# Patient Record
Sex: Male | Born: 1955 | Race: White | Hispanic: No | Marital: Married | Smoking: Never smoker
Health system: Southern US, Community
[De-identification: ages and names within clinical notes are randomized; demographics above are authoritative.]

## PROBLEM LIST (undated history)

## (undated) DIAGNOSIS — I10 Essential (primary) hypertension: Secondary | ICD-10-CM

## (undated) DIAGNOSIS — G629 Polyneuropathy, unspecified: Secondary | ICD-10-CM

## (undated) DIAGNOSIS — C9000 Multiple myeloma not having achieved remission: Secondary | ICD-10-CM

## (undated) DIAGNOSIS — Z9221 Personal history of antineoplastic chemotherapy: Secondary | ICD-10-CM

## (undated) DIAGNOSIS — E7800 Pure hypercholesterolemia, unspecified: Secondary | ICD-10-CM

## (undated) DIAGNOSIS — C801 Malignant (primary) neoplasm, unspecified: Secondary | ICD-10-CM

## (undated) DIAGNOSIS — T7840XA Allergy, unspecified, initial encounter: Secondary | ICD-10-CM

## (undated) DIAGNOSIS — J069 Acute upper respiratory infection, unspecified: Secondary | ICD-10-CM

## (undated) DIAGNOSIS — D649 Anemia, unspecified: Secondary | ICD-10-CM

## (undated) DIAGNOSIS — D126 Benign neoplasm of colon, unspecified: Secondary | ICD-10-CM

## (undated) DIAGNOSIS — R011 Cardiac murmur, unspecified: Secondary | ICD-10-CM

## (undated) DIAGNOSIS — J189 Pneumonia, unspecified organism: Secondary | ICD-10-CM

## (undated) DIAGNOSIS — Z9889 Other specified postprocedural states: Secondary | ICD-10-CM

## (undated) DIAGNOSIS — T783XXA Angioneurotic edema, initial encounter: Secondary | ICD-10-CM

## (undated) DIAGNOSIS — E663 Overweight: Secondary | ICD-10-CM

## (undated) DIAGNOSIS — Z9484 Stem cells transplant status: Secondary | ICD-10-CM

## (undated) DIAGNOSIS — I2510 Atherosclerotic heart disease of native coronary artery without angina pectoris: Secondary | ICD-10-CM

## (undated) DIAGNOSIS — L509 Urticaria, unspecified: Secondary | ICD-10-CM

## (undated) HISTORY — DX: Multiple myeloma not having achieved remission: C90.00

## (undated) HISTORY — DX: Cardiac murmur, unspecified: R01.1

## (undated) HISTORY — DX: Essential (primary) hypertension: I10

## (undated) HISTORY — DX: Urticaria, unspecified: L50.9

## (undated) HISTORY — DX: Angioneurotic edema, initial encounter: T78.3XXA

## (undated) HISTORY — DX: Acute upper respiratory infection, unspecified: J06.9

## (undated) HISTORY — DX: Pure hypercholesterolemia, unspecified: E78.00

## (undated) HISTORY — DX: Atherosclerotic heart disease of native coronary artery without angina pectoris: I25.10

## (undated) HISTORY — DX: Pneumonia, unspecified organism: J18.9

## (undated) HISTORY — DX: Allergy, unspecified, initial encounter: T78.40XA

## (undated) HISTORY — DX: Benign neoplasm of colon, unspecified: D12.6

## (undated) HISTORY — DX: Anemia, unspecified: D64.9

## (undated) HISTORY — DX: Personal history of antineoplastic chemotherapy: Z92.21

## (undated) HISTORY — DX: Malignant (primary) neoplasm, unspecified: C80.1

## (undated) HISTORY — PX: LIMBAL STEM CELL TRANSPLANT: SHX1969

## (undated) HISTORY — DX: Other specified postprocedural states: Z98.890

## (undated) HISTORY — DX: Overweight: E66.3

## (undated) HISTORY — DX: Polyneuropathy, unspecified: G62.9

---

## 1998-11-20 ENCOUNTER — Ambulatory Visit (HOSPITAL_COMMUNITY): Admission: RE | Admit: 1998-11-20 | Discharge: 1998-11-20 | Payer: Self-pay | Admitting: Internal Medicine

## 1998-11-20 ENCOUNTER — Encounter: Payer: Self-pay | Admitting: Internal Medicine

## 2002-07-05 ENCOUNTER — Encounter: Admission: RE | Admit: 2002-07-05 | Discharge: 2002-07-05 | Payer: Self-pay | Admitting: Internal Medicine

## 2002-07-05 ENCOUNTER — Encounter: Payer: Self-pay | Admitting: Internal Medicine

## 2002-07-12 ENCOUNTER — Inpatient Hospital Stay (HOSPITAL_COMMUNITY): Admission: EM | Admit: 2002-07-12 | Discharge: 2002-07-13 | Payer: Self-pay | Admitting: Emergency Medicine

## 2002-07-12 ENCOUNTER — Encounter: Payer: Self-pay | Admitting: Emergency Medicine

## 2002-07-13 DIAGNOSIS — Z9889 Other specified postprocedural states: Secondary | ICD-10-CM

## 2002-07-13 HISTORY — DX: Other specified postprocedural states: Z98.890

## 2002-07-15 DIAGNOSIS — C9000 Multiple myeloma not having achieved remission: Secondary | ICD-10-CM

## 2002-07-15 HISTORY — DX: Multiple myeloma not having achieved remission: C90.00

## 2002-11-04 ENCOUNTER — Encounter: Payer: Self-pay | Admitting: *Deleted

## 2002-11-04 ENCOUNTER — Ambulatory Visit (HOSPITAL_COMMUNITY): Admission: RE | Admit: 2002-11-04 | Discharge: 2002-11-04 | Payer: Self-pay | Admitting: *Deleted

## 2002-11-15 ENCOUNTER — Other Ambulatory Visit: Admission: RE | Admit: 2002-11-15 | Discharge: 2002-11-15 | Payer: Self-pay | Admitting: *Deleted

## 2002-11-15 ENCOUNTER — Encounter (INDEPENDENT_AMBULATORY_CARE_PROVIDER_SITE_OTHER): Payer: Self-pay | Admitting: Specialist

## 2003-02-07 ENCOUNTER — Ambulatory Visit (HOSPITAL_COMMUNITY): Admission: RE | Admit: 2003-02-07 | Discharge: 2003-02-07 | Payer: Self-pay | Admitting: Oncology

## 2003-02-07 ENCOUNTER — Encounter: Payer: Self-pay | Admitting: Oncology

## 2003-02-09 ENCOUNTER — Encounter: Payer: Self-pay | Admitting: *Deleted

## 2003-02-09 ENCOUNTER — Ambulatory Visit (HOSPITAL_COMMUNITY): Admission: RE | Admit: 2003-02-09 | Discharge: 2003-02-09 | Payer: Self-pay | Admitting: *Deleted

## 2003-02-10 ENCOUNTER — Inpatient Hospital Stay (HOSPITAL_COMMUNITY): Admission: AD | Admit: 2003-02-10 | Discharge: 2003-02-16 | Payer: Self-pay | Admitting: Oncology

## 2003-02-11 ENCOUNTER — Encounter (INDEPENDENT_AMBULATORY_CARE_PROVIDER_SITE_OTHER): Payer: Self-pay | Admitting: Specialist

## 2003-02-11 ENCOUNTER — Encounter: Payer: Self-pay | Admitting: Oncology

## 2003-02-14 ENCOUNTER — Encounter (INDEPENDENT_AMBULATORY_CARE_PROVIDER_SITE_OTHER): Payer: Self-pay | Admitting: Specialist

## 2003-05-16 DIAGNOSIS — Z9221 Personal history of antineoplastic chemotherapy: Secondary | ICD-10-CM

## 2003-05-16 DIAGNOSIS — Z9484 Stem cells transplant status: Secondary | ICD-10-CM

## 2003-05-16 HISTORY — DX: Personal history of antineoplastic chemotherapy: Z92.21

## 2003-05-16 HISTORY — DX: Stem cells transplant status: Z94.84

## 2003-09-05 ENCOUNTER — Ambulatory Visit (HOSPITAL_COMMUNITY): Admission: RE | Admit: 2003-09-05 | Discharge: 2003-09-05 | Payer: Self-pay | Admitting: Oncology

## 2003-09-08 ENCOUNTER — Ambulatory Visit (HOSPITAL_COMMUNITY): Admission: RE | Admit: 2003-09-08 | Discharge: 2003-09-08 | Payer: Self-pay | Admitting: Oncology

## 2004-06-04 ENCOUNTER — Ambulatory Visit: Payer: Self-pay | Admitting: Oncology

## 2004-06-06 ENCOUNTER — Ambulatory Visit: Payer: Self-pay | Admitting: Pulmonary Disease

## 2004-07-30 ENCOUNTER — Ambulatory Visit: Payer: Self-pay | Admitting: Oncology

## 2004-09-11 ENCOUNTER — Emergency Department (HOSPITAL_COMMUNITY): Admission: EM | Admit: 2004-09-11 | Discharge: 2004-09-11 | Payer: Self-pay | Admitting: Emergency Medicine

## 2004-09-21 ENCOUNTER — Ambulatory Visit: Payer: Self-pay | Admitting: Oncology

## 2004-11-16 ENCOUNTER — Ambulatory Visit: Payer: Self-pay | Admitting: Oncology

## 2004-12-24 ENCOUNTER — Ambulatory Visit: Payer: Self-pay | Admitting: Pulmonary Disease

## 2005-01-18 ENCOUNTER — Ambulatory Visit: Payer: Self-pay | Admitting: Pulmonary Disease

## 2005-02-22 ENCOUNTER — Ambulatory Visit: Payer: Self-pay | Admitting: Oncology

## 2005-02-25 ENCOUNTER — Ambulatory Visit: Payer: Self-pay | Admitting: Pulmonary Disease

## 2005-04-09 ENCOUNTER — Ambulatory Visit: Payer: Self-pay | Admitting: Pulmonary Disease

## 2005-05-24 ENCOUNTER — Ambulatory Visit: Payer: Self-pay | Admitting: Oncology

## 2005-07-22 ENCOUNTER — Ambulatory Visit: Payer: Self-pay | Admitting: Pulmonary Disease

## 2005-07-31 ENCOUNTER — Ambulatory Visit: Payer: Self-pay | Admitting: Pulmonary Disease

## 2005-08-02 ENCOUNTER — Ambulatory Visit: Payer: Self-pay | Admitting: Oncology

## 2005-11-20 ENCOUNTER — Ambulatory Visit: Payer: Self-pay | Admitting: Oncology

## 2005-11-22 LAB — CBC WITH DIFFERENTIAL/PLATELET
Basophils Absolute: 0 10*3/uL (ref 0.0–0.1)
Eosinophils Absolute: 0.1 10*3/uL (ref 0.0–0.5)
HGB: 14.5 g/dL (ref 13.0–17.1)
LYMPH%: 32.5 % (ref 14.0–48.0)
MCV: 92.6 fL (ref 81.6–98.0)
MONO#: 0.3 10*3/uL (ref 0.1–0.9)
MONO%: 5.2 % (ref 0.0–13.0)
NEUT#: 3.6 10*3/uL (ref 1.5–6.5)
Platelets: 240 10*3/uL (ref 145–400)
RBC: 4.48 10*6/uL (ref 4.20–5.71)
RDW: 14.2 % (ref 11.2–14.6)
WBC: 6.1 10*3/uL (ref 4.0–10.0)

## 2005-11-22 LAB — COMPREHENSIVE METABOLIC PANEL
ALT: 55 U/L — ABNORMAL HIGH (ref 0–40)
AST: 31 U/L (ref 0–37)
Alkaline Phosphatase: 41 U/L (ref 39–117)
BUN: 15 mg/dL (ref 6–23)
CO2: 29 mEq/L (ref 19–32)
Calcium: 9.7 mg/dL (ref 8.4–10.5)
Glucose, Bld: 119 mg/dL — ABNORMAL HIGH (ref 70–99)
Potassium: 3.7 mEq/L (ref 3.5–5.3)
Sodium: 137 mEq/L (ref 135–145)
Total Bilirubin: 0.9 mg/dL (ref 0.3–1.2)

## 2005-11-25 LAB — SPEP & IFE WITH QIG
Albumin ELP: 62.5 % (ref 55.8–66.1)
Alpha-1-Globulin: 4.2 % (ref 2.9–4.9)
Beta 2: 11.3 % — ABNORMAL HIGH (ref 3.2–6.5)
IgM, Serum: 63 mg/dL (ref 60–263)

## 2006-02-19 ENCOUNTER — Ambulatory Visit: Payer: Self-pay | Admitting: Oncology

## 2006-02-21 LAB — COMPREHENSIVE METABOLIC PANEL
ALT: 79 U/L — ABNORMAL HIGH (ref 0–40)
AST: 51 U/L — ABNORMAL HIGH (ref 0–37)
Albumin: 4.1 g/dL (ref 3.5–5.2)
Alkaline Phosphatase: 37 U/L — ABNORMAL LOW (ref 39–117)
Potassium: 3.8 mEq/L (ref 3.5–5.3)
Sodium: 137 mEq/L (ref 135–145)
Total Bilirubin: 0.8 mg/dL (ref 0.3–1.2)
Total Protein: 6.4 g/dL (ref 6.0–8.3)

## 2006-02-21 LAB — CBC WITH DIFFERENTIAL/PLATELET
Basophils Absolute: 0 10*3/uL (ref 0.0–0.1)
Eosinophils Absolute: 0.1 10*3/uL (ref 0.0–0.5)
HCT: 38.1 % — ABNORMAL LOW (ref 38.7–49.9)
HGB: 13.3 g/dL (ref 13.0–17.1)
MONO#: 0.3 10*3/uL (ref 0.1–0.9)
NEUT%: 52.3 % (ref 40.0–75.0)
Platelets: 204 10*3/uL (ref 145–400)
WBC: 4.3 10*3/uL (ref 4.0–10.0)
lymph#: 1.6 10*3/uL (ref 0.9–3.3)

## 2006-02-25 LAB — SPEP & IFE WITH QIG
Albumin ELP: 62.1 % (ref 55.8–66.1)
Alpha-2-Globulin: 9.7 % (ref 7.1–11.8)
Beta Globulin: 6.4 % (ref 4.7–7.2)
M-Spike, %: 0.23 g/dL
Total Protein, Serum Electrophoresis: 7.1 g/dL (ref 6.0–8.3)

## 2006-05-28 ENCOUNTER — Ambulatory Visit: Payer: Self-pay | Admitting: Oncology

## 2006-08-26 ENCOUNTER — Ambulatory Visit: Payer: Self-pay | Admitting: Oncology

## 2006-08-29 LAB — CBC WITH DIFFERENTIAL/PLATELET
Basophils Absolute: 0 10*3/uL (ref 0.0–0.1)
HCT: 39.3 % (ref 38.7–49.9)
HGB: 13.9 g/dL (ref 13.0–17.1)
MONO#: 0.4 10*3/uL (ref 0.1–0.9)
NEUT%: 62.4 % (ref 40.0–75.0)
WBC: 7.1 10*3/uL (ref 4.0–10.0)
lymph#: 2.1 10*3/uL (ref 0.9–3.3)

## 2006-09-03 LAB — PROTEIN ELECTROPHORESIS, SERUM
Alpha-1-Globulin: 4.9 % (ref 2.9–4.9)
Alpha-2-Globulin: 10.7 % (ref 7.1–11.8)
M-Spike, %: 0.38 g/dL
Total Protein, Serum Electrophoresis: 7.1 g/dL (ref 6.0–8.3)

## 2006-09-03 LAB — KAPPA/LAMBDA LIGHT CHAINS: Kappa:Lambda Ratio: 13.81 — ABNORMAL HIGH (ref 0.26–1.65)

## 2006-09-03 LAB — COMPREHENSIVE METABOLIC PANEL
BUN: 15 mg/dL (ref 6–23)
CO2: 26 mEq/L (ref 19–32)
Calcium: 9.6 mg/dL (ref 8.4–10.5)
Chloride: 102 mEq/L (ref 96–112)
Creatinine, Ser: 0.96 mg/dL (ref 0.40–1.50)

## 2006-09-03 LAB — BETA 2 MICROGLOBULIN, SERUM: Beta-2 Microglobulin: 1.45 mg/L (ref 1.01–1.73)

## 2006-10-02 ENCOUNTER — Ambulatory Visit: Payer: Self-pay | Admitting: Pulmonary Disease

## 2006-10-07 ENCOUNTER — Ambulatory Visit: Payer: Self-pay | Admitting: Pulmonary Disease

## 2006-10-07 LAB — CONVERTED CEMR LAB
ALT: 66 units/L — ABNORMAL HIGH (ref 0–40)
Albumin: 4.1 g/dL (ref 3.5–5.2)
Alkaline Phosphatase: 46 units/L (ref 39–117)
BUN: 16 mg/dL (ref 6–23)
CO2: 27 meq/L (ref 19–32)
Calcium: 9.3 mg/dL (ref 8.4–10.5)
Creatinine, Ser: 0.9 mg/dL (ref 0.4–1.5)
GFR calc Af Amer: 114 mL/min
Potassium: 3.5 meq/L (ref 3.5–5.1)
Total Bilirubin: 0.8 mg/dL (ref 0.3–1.2)
Total Protein: 7.1 g/dL (ref 6.0–8.3)
Triglycerides: 66 mg/dL (ref 0–149)
VLDL: 13 mg/dL (ref 0–40)

## 2006-11-19 ENCOUNTER — Ambulatory Visit: Payer: Self-pay | Admitting: Oncology

## 2006-11-21 LAB — CBC WITH DIFFERENTIAL/PLATELET
Basophils Absolute: 0 10*3/uL (ref 0.0–0.1)
Eosinophils Absolute: 0.1 10*3/uL (ref 0.0–0.5)
HCT: 38.9 % (ref 38.7–49.9)
HGB: 14.1 g/dL (ref 13.0–17.1)
LYMPH%: 30.8 % (ref 14.0–48.0)
MCHC: 36.2 g/dL — ABNORMAL HIGH (ref 32.0–35.9)
MONO#: 0.4 10*3/uL (ref 0.1–0.9)
NEUT#: 4.3 10*3/uL (ref 1.5–6.5)
NEUT%: 61.3 % (ref 40.0–75.0)
Platelets: 260 10*3/uL (ref 145–400)
WBC: 7.1 10*3/uL (ref 4.0–10.0)
lymph#: 2.2 10*3/uL (ref 0.9–3.3)

## 2006-11-25 LAB — COMPREHENSIVE METABOLIC PANEL
ALT: 66 U/L — ABNORMAL HIGH (ref 0–53)
BUN: 17 mg/dL (ref 6–23)
CO2: 25 mEq/L (ref 19–32)
Calcium: 10 mg/dL (ref 8.4–10.5)
Chloride: 103 mEq/L (ref 96–112)
Creatinine, Ser: 1.04 mg/dL (ref 0.40–1.50)
Glucose, Bld: 115 mg/dL — ABNORMAL HIGH (ref 70–99)
Total Bilirubin: 0.6 mg/dL (ref 0.3–1.2)

## 2006-11-25 LAB — PROTEIN ELECTROPHORESIS, SERUM
Albumin ELP: 60.1 % (ref 55.8–66.1)
Alpha-1-Globulin: 4.3 % (ref 2.9–4.9)
Beta 2: 13.7 % — ABNORMAL HIGH (ref 3.2–6.5)
Beta Globulin: 6.4 % (ref 4.7–7.2)

## 2006-11-25 LAB — KAPPA/LAMBDA LIGHT CHAINS
Kappa free light chain: 3.02 mg/dL — ABNORMAL HIGH (ref 0.33–1.94)
Kappa:Lambda Ratio: 13.73 — ABNORMAL HIGH (ref 0.26–1.65)

## 2006-11-25 LAB — BETA 2 MICROGLOBULIN, SERUM: Beta-2 Microglobulin: 1.37 mg/L (ref 1.01–1.73)

## 2007-02-04 ENCOUNTER — Ambulatory Visit: Payer: Self-pay | Admitting: Pulmonary Disease

## 2007-02-25 ENCOUNTER — Ambulatory Visit: Payer: Self-pay | Admitting: Oncology

## 2007-02-27 LAB — CBC WITH DIFFERENTIAL/PLATELET
Basophils Absolute: 0 10*3/uL (ref 0.0–0.1)
EOS%: 3.9 % (ref 0.0–7.0)
Eosinophils Absolute: 0.2 10*3/uL (ref 0.0–0.5)
HCT: 39.2 % (ref 38.7–49.9)
HGB: 14.2 g/dL (ref 13.0–17.1)
MCH: 33.6 pg — ABNORMAL HIGH (ref 28.0–33.4)
MCV: 92.8 fL (ref 81.6–98.0)
NEUT#: 3.5 10*3/uL (ref 1.5–6.5)
NEUT%: 61.3 % (ref 40.0–75.0)
lymph#: 1.7 10*3/uL (ref 0.9–3.3)

## 2007-03-03 LAB — COMPREHENSIVE METABOLIC PANEL
AST: 22 U/L (ref 0–37)
Albumin: 4.7 g/dL (ref 3.5–5.2)
BUN: 15 mg/dL (ref 6–23)
Calcium: 9.2 mg/dL (ref 8.4–10.5)
Chloride: 105 mEq/L (ref 96–112)
Creatinine, Ser: 0.96 mg/dL (ref 0.40–1.50)
Glucose, Bld: 105 mg/dL — ABNORMAL HIGH (ref 70–99)

## 2007-03-03 LAB — KAPPA/LAMBDA LIGHT CHAINS
Kappa free light chain: 1.89 mg/dL (ref 0.33–1.94)
Kappa:Lambda Ratio: 2.22 — ABNORMAL HIGH (ref 0.26–1.65)
Lambda Free Lght Chn: 0.85 mg/dL (ref 0.57–2.63)

## 2007-03-03 LAB — PROTEIN ELECTROPHORESIS, SERUM
Alpha-1-Globulin: 8.9 % — ABNORMAL HIGH (ref 2.9–4.9)
Beta 2: 12 % — ABNORMAL HIGH (ref 3.2–6.5)
Gamma Globulin: 5.1 % — ABNORMAL LOW (ref 11.1–18.8)
M-Spike, %: 0.6 g/dL

## 2007-03-03 LAB — BETA 2 MICROGLOBULIN, SERUM: Beta-2 Microglobulin: 1.3 mg/L (ref 1.01–1.73)

## 2007-03-06 ENCOUNTER — Ambulatory Visit: Payer: Self-pay | Admitting: Pulmonary Disease

## 2007-04-10 ENCOUNTER — Ambulatory Visit: Payer: Self-pay | Admitting: Internal Medicine

## 2007-04-15 DIAGNOSIS — Z9889 Other specified postprocedural states: Secondary | ICD-10-CM

## 2007-04-15 HISTORY — DX: Other specified postprocedural states: Z98.890

## 2007-04-24 ENCOUNTER — Encounter: Payer: Self-pay | Admitting: Internal Medicine

## 2007-04-24 ENCOUNTER — Ambulatory Visit: Payer: Self-pay | Admitting: Internal Medicine

## 2007-05-26 ENCOUNTER — Ambulatory Visit: Payer: Self-pay | Admitting: Oncology

## 2007-06-29 DIAGNOSIS — I10 Essential (primary) hypertension: Secondary | ICD-10-CM | POA: Insufficient documentation

## 2007-06-29 DIAGNOSIS — E7800 Pure hypercholesterolemia, unspecified: Secondary | ICD-10-CM

## 2007-08-20 ENCOUNTER — Ambulatory Visit: Payer: Self-pay | Admitting: Oncology

## 2007-08-24 LAB — CBC WITH DIFFERENTIAL/PLATELET
BASO%: 0 % (ref 0.0–2.0)
Basophils Absolute: 0 10*3/uL (ref 0.0–0.1)
EOS%: 2.8 % (ref 0.0–7.0)
HGB: 14.6 g/dL (ref 13.0–17.1)
MCH: 33.2 pg (ref 28.0–33.4)
MCHC: 36.4 g/dL — ABNORMAL HIGH (ref 32.0–35.9)
MCV: 91.2 fL (ref 81.6–98.0)
MONO%: 5 % (ref 0.0–13.0)
RBC: 4.39 10*6/uL (ref 4.20–5.71)
RDW: 13.7 % (ref 11.2–14.6)
lymph#: 1.5 10*3/uL (ref 0.9–3.3)

## 2007-08-26 LAB — COMPREHENSIVE METABOLIC PANEL
ALT: 52 U/L (ref 0–53)
AST: 30 U/L (ref 0–37)
Albumin: 4.7 g/dL (ref 3.5–5.2)
Alkaline Phosphatase: 48 U/L (ref 39–117)
BUN: 17 mg/dL (ref 6–23)
Chloride: 103 mEq/L (ref 96–112)
Creatinine, Ser: 1 mg/dL (ref 0.40–1.50)
Potassium: 4 mEq/L (ref 3.5–5.3)

## 2007-08-26 LAB — PROTEIN ELECTROPHORESIS, SERUM
Alpha-1-Globulin: 3.9 % (ref 2.9–4.9)
Alpha-2-Globulin: 10.5 % (ref 7.1–11.8)
Beta 2: 16.5 % — ABNORMAL HIGH (ref 3.2–6.5)
Gamma Globulin: 4.4 % — ABNORMAL LOW (ref 11.1–18.8)
M-Spike, %: 0.68 g/dL

## 2007-08-26 LAB — KAPPA/LAMBDA LIGHT CHAINS: Kappa:Lambda Ratio: 8.13 — ABNORMAL HIGH (ref 0.26–1.65)

## 2007-11-11 ENCOUNTER — Ambulatory Visit: Payer: Self-pay | Admitting: Oncology

## 2007-11-13 LAB — CBC WITH DIFFERENTIAL/PLATELET
Basophils Absolute: 0.1 10*3/uL (ref 0.0–0.1)
Eosinophils Absolute: 0.1 10*3/uL (ref 0.0–0.5)
HGB: 14.8 g/dL (ref 13.0–17.1)
NEUT#: 3.9 10*3/uL (ref 1.5–6.5)
RDW: 13.7 % (ref 11.2–14.6)
WBC: 6.1 10*3/uL (ref 4.0–10.0)
lymph#: 1.7 10*3/uL (ref 0.9–3.3)

## 2007-11-17 LAB — PROTEIN ELECTROPHORESIS, SERUM
Albumin ELP: 56.4 % (ref 55.8–66.1)
Alpha-1-Globulin: 4 % (ref 2.9–4.9)
Gamma Globulin: 3.9 % — ABNORMAL LOW (ref 11.1–18.8)

## 2007-11-17 LAB — KAPPA/LAMBDA LIGHT CHAINS

## 2007-11-17 LAB — COMPREHENSIVE METABOLIC PANEL
AST: 28 U/L (ref 0–37)
Albumin: 4.6 g/dL (ref 3.5–5.2)
BUN: 15 mg/dL (ref 6–23)
Calcium: 9 mg/dL (ref 8.4–10.5)
Chloride: 105 mEq/L (ref 96–112)
Glucose, Bld: 117 mg/dL — ABNORMAL HIGH (ref 70–99)
Potassium: 4.1 mEq/L (ref 3.5–5.3)
Total Protein: 7.4 g/dL (ref 6.0–8.3)

## 2007-11-17 LAB — BETA 2 MICROGLOBULIN, SERUM: Beta-2 Microglobulin: 1.49 mg/L (ref 1.01–1.73)

## 2007-11-20 ENCOUNTER — Encounter: Payer: Self-pay | Admitting: Pulmonary Disease

## 2007-12-14 DIAGNOSIS — J189 Pneumonia, unspecified organism: Secondary | ICD-10-CM

## 2007-12-14 HISTORY — DX: Pneumonia, unspecified organism: J18.9

## 2007-12-17 ENCOUNTER — Ambulatory Visit (HOSPITAL_COMMUNITY): Admission: RE | Admit: 2007-12-17 | Discharge: 2007-12-17 | Payer: Self-pay | Admitting: Oncology

## 2007-12-17 ENCOUNTER — Encounter: Payer: Self-pay | Admitting: Pulmonary Disease

## 2007-12-21 ENCOUNTER — Encounter: Payer: Self-pay | Admitting: Pulmonary Disease

## 2008-01-12 ENCOUNTER — Ambulatory Visit: Payer: Self-pay | Admitting: Oncology

## 2008-01-14 ENCOUNTER — Ambulatory Visit (HOSPITAL_COMMUNITY): Admission: RE | Admit: 2008-01-14 | Discharge: 2008-01-14 | Payer: Self-pay | Admitting: Oncology

## 2008-01-18 LAB — SPEP & IFE WITH QIG
Alpha-2-Globulin: 10.8 % (ref 7.1–11.8)
Gamma Globulin: 3.1 % — ABNORMAL LOW (ref 11.1–18.8)
IgG (Immunoglobin G), Serum: 1660 mg/dL — ABNORMAL HIGH (ref 694–1618)
IgM, Serum: 47 mg/dL — ABNORMAL LOW (ref 60–263)
M-Spike, %: 1.32 g/dL

## 2008-01-18 LAB — KAPPA/LAMBDA LIGHT CHAINS: Kappa:Lambda Ratio: 8.55 — ABNORMAL HIGH (ref 0.26–1.65)

## 2008-01-22 ENCOUNTER — Encounter: Payer: Self-pay | Admitting: Pulmonary Disease

## 2008-01-22 LAB — COMPREHENSIVE METABOLIC PANEL
ALT: 57 U/L — ABNORMAL HIGH (ref 0–53)
AST: 41 U/L — ABNORMAL HIGH (ref 0–37)
Alkaline Phosphatase: 44 U/L (ref 39–117)
CO2: 24 mEq/L (ref 19–32)
Sodium: 137 mEq/L (ref 135–145)
Total Bilirubin: 0.7 mg/dL (ref 0.3–1.2)
Total Protein: 8 g/dL (ref 6.0–8.3)

## 2008-01-22 LAB — CBC WITH DIFFERENTIAL/PLATELET
BASO%: 0.5 % (ref 0.0–2.0)
EOS%: 1.3 % (ref 0.0–7.0)
LYMPH%: 32.8 % (ref 14.0–48.0)
MCHC: 35.7 g/dL (ref 32.0–35.9)
MONO#: 0.3 10*3/uL (ref 0.1–0.9)
MONO%: 6.1 % (ref 0.0–13.0)
Platelets: 234 10*3/uL (ref 145–400)
RBC: 4.38 10*6/uL (ref 4.20–5.71)
WBC: 4.8 10*3/uL (ref 4.0–10.0)

## 2008-03-16 ENCOUNTER — Ambulatory Visit: Payer: Self-pay | Admitting: Oncology

## 2008-03-17 LAB — CBC WITH DIFFERENTIAL/PLATELET
Basophils Absolute: 0 10*3/uL (ref 0.0–0.1)
Eosinophils Absolute: 0.1 10*3/uL (ref 0.0–0.5)
HCT: 40.7 % (ref 38.7–49.9)
HGB: 14.4 g/dL (ref 13.0–17.1)
NEUT#: 3.4 10*3/uL (ref 1.5–6.5)
NEUT%: 58.6 % (ref 40.0–75.0)
RDW: 14.1 % (ref 11.2–14.6)
lymph#: 2 10*3/uL (ref 0.9–3.3)

## 2008-03-22 LAB — COMPREHENSIVE METABOLIC PANEL
Albumin: 4.7 g/dL (ref 3.5–5.2)
BUN: 16 mg/dL (ref 6–23)
CO2: 24 mEq/L (ref 19–32)
Calcium: 9.6 mg/dL (ref 8.4–10.5)
Chloride: 102 mEq/L (ref 96–112)
Glucose, Bld: 135 mg/dL — ABNORMAL HIGH (ref 70–99)
Potassium: 3.8 mEq/L (ref 3.5–5.3)

## 2008-03-22 LAB — PROTEIN ELECTROPHORESIS, SERUM
Albumin ELP: 55.5 % — ABNORMAL LOW (ref 55.8–66.1)
Alpha-1-Globulin: 3.8 % (ref 2.9–4.9)
Gamma Globulin: 2.4 % — ABNORMAL LOW (ref 11.1–18.8)
Total Protein, Serum Electrophoresis: 7.8 g/dL (ref 6.0–8.3)

## 2008-03-22 LAB — KAPPA/LAMBDA LIGHT CHAINS
Kappa free light chain: 7.37 mg/dL — ABNORMAL HIGH (ref 0.33–1.94)
Lambda Free Lght Chn: <0.49 mg/dL — ABNORMAL LOW (ref 0.57–2.63)

## 2008-05-17 ENCOUNTER — Encounter: Payer: Self-pay | Admitting: Pulmonary Disease

## 2008-05-27 ENCOUNTER — Encounter: Payer: Self-pay | Admitting: Pulmonary Disease

## 2008-06-03 ENCOUNTER — Ambulatory Visit: Payer: Self-pay | Admitting: Oncology

## 2008-06-07 ENCOUNTER — Encounter: Payer: Self-pay | Admitting: Pulmonary Disease

## 2008-07-20 ENCOUNTER — Ambulatory Visit: Payer: Self-pay | Admitting: Oncology

## 2008-07-22 ENCOUNTER — Encounter: Payer: Self-pay | Admitting: Pulmonary Disease

## 2008-07-22 LAB — CBC WITH DIFFERENTIAL/PLATELET
BASO%: 0.5 % (ref 0.0–2.0)
HCT: 40.6 % (ref 38.7–49.9)
LYMPH%: 33.8 % (ref 14.0–48.0)
MCH: 33.1 pg (ref 28.0–33.4)
MCHC: 35.2 g/dL (ref 32.0–35.9)
MONO#: 0.3 10*3/uL (ref 0.1–0.9)
NEUT%: 59 % (ref 40.0–75.0)
Platelets: 216 10*3/uL (ref 145–400)
WBC: 5.7 10*3/uL (ref 4.0–10.0)

## 2008-07-26 LAB — COMPREHENSIVE METABOLIC PANEL
ALT: 50 U/L (ref 0–53)
CO2: 24 mEq/L (ref 19–32)
Creatinine, Ser: 1.05 mg/dL (ref 0.40–1.50)
Total Bilirubin: 0.6 mg/dL (ref 0.3–1.2)

## 2008-07-26 LAB — KAPPA/LAMBDA LIGHT CHAINS
Kappa free light chain: 25.7 mg/dL — ABNORMAL HIGH (ref 0.33–1.94)
Kappa:Lambda Ratio: 35.21 — ABNORMAL HIGH (ref 0.26–1.65)
Lambda Free Lght Chn: 0.73 mg/dL (ref 0.57–2.63)

## 2008-07-26 LAB — BETA 2 MICROGLOBULIN, SERUM: Beta-2 Microglobulin: 1.77 mg/L — ABNORMAL HIGH (ref 1.01–1.73)

## 2008-07-26 LAB — PROTEIN ELECTROPHORESIS, SERUM
Albumin ELP: 52.1 % — ABNORMAL LOW (ref 55.8–66.1)
Alpha-1-Globulin: 3.5 % (ref 2.9–4.9)
Alpha-2-Globulin: 9.7 % (ref 7.1–11.8)
Gamma Globulin: 2.1 % — ABNORMAL LOW (ref 11.1–18.8)
Total Protein, Serum Electrophoresis: 8.3 g/dL (ref 6.0–8.3)

## 2008-07-26 LAB — LACTATE DEHYDROGENASE: LDH: 136 U/L (ref 94–250)

## 2008-08-12 LAB — CBC WITH DIFFERENTIAL/PLATELET
BASO%: 0.2 % (ref 0.0–2.0)
Basophils Absolute: 0 10*3/uL (ref 0.0–0.1)
EOS%: 4.4 % (ref 0.0–7.0)
HCT: 39.5 % (ref 38.7–49.9)
HGB: 14 g/dL (ref 13.0–17.1)
MCH: 33.2 pg (ref 28.0–33.4)
MONO#: 0.5 10*3/uL (ref 0.1–0.9)
RDW: 13.3 % (ref 11.2–14.6)
WBC: 6.3 10*3/uL (ref 4.0–10.0)
lymph#: 1.5 10*3/uL (ref 0.9–3.3)

## 2008-08-23 ENCOUNTER — Encounter: Payer: Self-pay | Admitting: Pulmonary Disease

## 2008-08-23 LAB — CBC WITH DIFFERENTIAL/PLATELET
BASO%: 1.5 % (ref 0.0–2.0)
Eosinophils Absolute: 0.1 10*3/uL (ref 0.0–0.5)
HCT: 38.7 % (ref 38.7–49.9)
MCHC: 34.8 g/dL (ref 32.0–35.9)
MONO#: 0.4 10*3/uL (ref 0.1–0.9)
NEUT%: 55 % (ref 40.0–75.0)
Platelets: 210 10*3/uL (ref 145–400)
RBC: 4.09 10*6/uL — ABNORMAL LOW (ref 4.20–5.71)
lymph#: 1.8 10*3/uL (ref 0.9–3.3)

## 2008-09-14 ENCOUNTER — Ambulatory Visit: Payer: Self-pay | Admitting: Oncology

## 2008-09-16 ENCOUNTER — Encounter: Payer: Self-pay | Admitting: Pulmonary Disease

## 2008-09-16 LAB — CBC WITH DIFFERENTIAL/PLATELET
BASO%: 0.7 % (ref 0.0–2.0)
EOS%: 18.6 % — ABNORMAL HIGH (ref 0.0–7.0)
MCH: 31.4 pg (ref 27.2–33.4)
MCHC: 35.1 g/dL (ref 32.0–36.0)
MONO#: 0.4 10*3/uL (ref 0.1–0.9)
RBC: 4.11 10*6/uL — ABNORMAL LOW (ref 4.20–5.82)
WBC: 4.4 10*3/uL (ref 4.0–10.3)
lymph#: 1.1 10*3/uL (ref 0.9–3.3)

## 2008-09-20 LAB — COMPREHENSIVE METABOLIC PANEL
Albumin: 4.5 g/dL (ref 3.5–5.2)
CO2: 23 mEq/L (ref 19–32)
Chloride: 105 mEq/L (ref 96–112)
Glucose, Bld: 106 mg/dL — ABNORMAL HIGH (ref 70–99)
Potassium: 3.7 mEq/L (ref 3.5–5.3)
Sodium: 140 mEq/L (ref 135–145)
Total Protein: 6.3 g/dL (ref 6.0–8.3)

## 2008-09-20 LAB — PROTEIN ELECTROPHORESIS, SERUM
Albumin ELP: 60.6 % (ref 55.8–66.1)
Beta 2: 11.2 % — ABNORMAL HIGH (ref 3.2–6.5)
Total Protein, Serum Electrophoresis: 6.3 g/dL (ref 6.0–8.3)

## 2008-09-20 LAB — KAPPA/LAMBDA LIGHT CHAINS
Kappa free light chain: 1.01 mg/dL (ref 0.33–1.94)
Lambda Free Lght Chn: 0.95 mg/dL (ref 0.57–2.63)

## 2008-10-10 ENCOUNTER — Ambulatory Visit: Payer: Self-pay | Admitting: Pulmonary Disease

## 2008-10-10 DIAGNOSIS — T783XXA Angioneurotic edema, initial encounter: Secondary | ICD-10-CM | POA: Insufficient documentation

## 2008-10-10 DIAGNOSIS — L509 Urticaria, unspecified: Secondary | ICD-10-CM

## 2008-10-10 DIAGNOSIS — C9000 Multiple myeloma not having achieved remission: Secondary | ICD-10-CM

## 2008-10-14 ENCOUNTER — Encounter: Payer: Self-pay | Admitting: Pulmonary Disease

## 2008-10-14 LAB — CBC WITH DIFFERENTIAL/PLATELET
BASO%: 2.3 % — ABNORMAL HIGH (ref 0.0–2.0)
EOS%: 11.6 % — ABNORMAL HIGH (ref 0.0–7.0)
HCT: 36.9 % — ABNORMAL LOW (ref 38.4–49.9)
MCH: 32.6 pg (ref 27.2–33.4)
MCHC: 35.1 g/dL (ref 32.0–36.0)
MONO%: 6.2 % (ref 0.0–14.0)
NEUT%: 52.8 % (ref 39.0–75.0)
lymph#: 1.4 10*3/uL (ref 0.9–3.3)

## 2008-10-14 LAB — COMPREHENSIVE METABOLIC PANEL
Albumin: 4.4 g/dL (ref 3.5–5.2)
Alkaline Phosphatase: 49 U/L (ref 39–117)
BUN: 13 mg/dL (ref 6–23)
Glucose, Bld: 100 mg/dL — ABNORMAL HIGH (ref 70–99)
Sodium: 139 mEq/L (ref 135–145)
Total Bilirubin: 0.8 mg/dL (ref 0.3–1.2)

## 2008-10-15 DIAGNOSIS — D126 Benign neoplasm of colon, unspecified: Secondary | ICD-10-CM

## 2008-10-15 DIAGNOSIS — I2510 Atherosclerotic heart disease of native coronary artery without angina pectoris: Secondary | ICD-10-CM | POA: Insufficient documentation

## 2008-10-15 DIAGNOSIS — E663 Overweight: Secondary | ICD-10-CM | POA: Insufficient documentation

## 2008-10-18 ENCOUNTER — Ambulatory Visit: Payer: Self-pay | Admitting: Pulmonary Disease

## 2008-10-19 LAB — CONVERTED CEMR LAB
Alkaline Phosphatase: 45 units/L (ref 39–117)
Bilirubin, Direct: 0.1 mg/dL (ref 0.0–0.3)
Calcium: 8.8 mg/dL (ref 8.4–10.5)
Creatinine, Ser: 0.9 mg/dL (ref 0.4–1.5)
HDL: 63.9 mg/dL (ref >39.00)
LDL Cholesterol: 69 mg/dL (ref 0–99)
PSA: 0.6 ng/mL (ref 0.10–4.00)
Sodium: 138 meq/L (ref 135–145)
Total Bilirubin: 1 mg/dL (ref 0.3–1.2)
Total CHOL/HDL Ratio: 2
Triglycerides: 79 mg/dL (ref 0.0–149.0)

## 2008-11-08 ENCOUNTER — Ambulatory Visit: Payer: Self-pay | Admitting: Oncology

## 2008-11-10 ENCOUNTER — Encounter: Payer: Self-pay | Admitting: Pulmonary Disease

## 2008-11-10 LAB — COMPREHENSIVE METABOLIC PANEL
Albumin: 3.9 g/dL (ref 3.5–5.2)
Alkaline Phosphatase: 48 U/L (ref 39–117)
BUN: 12 mg/dL (ref 6–23)
CO2: 30 mEq/L (ref 19–32)
Calcium: 9.1 mg/dL (ref 8.4–10.5)
Glucose, Bld: 118 mg/dL — ABNORMAL HIGH (ref 70–99)
Potassium: 3.1 mEq/L — ABNORMAL LOW (ref 3.5–5.3)

## 2008-11-10 LAB — CBC WITH DIFFERENTIAL/PLATELET
Basophils Absolute: 0 10*3/uL (ref 0.0–0.1)
Eosinophils Absolute: 0.5 10*3/uL (ref 0.0–0.5)
HGB: 12.6 g/dL — ABNORMAL LOW (ref 13.0–17.1)
MCV: 94.1 fL (ref 79.3–98.0)
MONO%: 6.7 % (ref 0.0–14.0)
NEUT#: 2.7 10*3/uL (ref 1.5–6.5)
Platelets: 163 10*3/uL (ref 140–400)
RDW: 15.4 % — ABNORMAL HIGH (ref 11.0–14.6)

## 2008-11-14 LAB — PROTEIN ELECTROPHORESIS, SERUM: Gamma Globulin: 4.8 % — ABNORMAL LOW (ref 11.1–18.8)

## 2008-11-14 LAB — KAPPA/LAMBDA LIGHT CHAINS
Kappa free light chain: 1.8 mg/dL (ref 0.33–1.94)
Kappa:Lambda Ratio: 1.48 (ref 0.26–1.65)
Lambda Free Lght Chn: 1.22 mg/dL (ref 0.57–2.63)

## 2008-12-06 ENCOUNTER — Encounter: Payer: Self-pay | Admitting: Pulmonary Disease

## 2008-12-06 LAB — CBC WITH DIFFERENTIAL/PLATELET
BASO%: 0.4 % (ref 0.0–2.0)
Basophils Absolute: 0 10*3/uL (ref 0.0–0.1)
EOS%: 11.8 % — ABNORMAL HIGH (ref 0.0–7.0)
Eosinophils Absolute: 0.6 10*3/uL — ABNORMAL HIGH (ref 0.0–0.5)
HCT: 35.5 % — ABNORMAL LOW (ref 38.4–49.9)
HGB: 12.9 g/dL — ABNORMAL LOW (ref 13.0–17.1)
LYMPH%: 29.7 % (ref 14.0–49.0)
MCH: 32.2 pg (ref 27.2–33.4)
MCHC: 36.3 g/dL — ABNORMAL HIGH (ref 32.0–36.0)
MCV: 88.5 fL (ref 79.3–98.0)
MONO#: 0.5 10*3/uL (ref 0.1–0.9)
MONO%: 8.6 % (ref 0.0–14.0)
NEUT#: 2.7 10*3/uL (ref 1.5–6.5)
NEUT%: 49.5 % (ref 39.0–75.0)
Platelets: 155 10*3/uL (ref 140–400)
RBC: 4.01 10*6/uL — ABNORMAL LOW (ref 4.20–5.82)
RDW: 14.4 % (ref 11.0–14.6)
WBC: 5.4 10*3/uL (ref 4.0–10.3)
lymph#: 1.6 10*3/uL (ref 0.9–3.3)

## 2008-12-06 LAB — BASIC METABOLIC PANEL
CO2: 29 mEq/L (ref 19–32)
Calcium: 9.4 mg/dL (ref 8.4–10.5)
Creatinine, Ser: 0.98 mg/dL (ref 0.40–1.50)
Glucose, Bld: 110 mg/dL — ABNORMAL HIGH (ref 70–99)
Sodium: 138 mEq/L (ref 135–145)

## 2008-12-16 LAB — CBC WITH DIFFERENTIAL/PLATELET
BASO%: 1.2 % (ref 0.0–2.0)
EOS%: 1.6 % (ref 0.0–7.0)
LYMPH%: 34 % (ref 14.0–49.0)
MCH: 33.6 pg — ABNORMAL HIGH (ref 27.2–33.4)
MCHC: 35.4 g/dL (ref 32.0–36.0)
MONO#: 0.5 10*3/uL (ref 0.1–0.9)
RBC: 3.74 10*6/uL — ABNORMAL LOW (ref 4.20–5.82)
WBC: 4.5 10*3/uL (ref 4.0–10.3)
lymph#: 1.5 10*3/uL (ref 0.9–3.3)

## 2008-12-16 LAB — BASIC METABOLIC PANEL
CO2: 25 mEq/L (ref 19–32)
Calcium: 9 mg/dL (ref 8.4–10.5)
Chloride: 106 mEq/L (ref 96–112)
Sodium: 138 mEq/L (ref 135–145)

## 2009-01-04 ENCOUNTER — Ambulatory Visit: Payer: Self-pay | Admitting: Oncology

## 2009-01-06 ENCOUNTER — Ambulatory Visit (HOSPITAL_COMMUNITY): Admission: RE | Admit: 2009-01-06 | Discharge: 2009-01-06 | Payer: Self-pay | Admitting: Oncology

## 2009-01-06 ENCOUNTER — Encounter: Payer: Self-pay | Admitting: Pulmonary Disease

## 2009-01-06 LAB — CBC WITH DIFFERENTIAL/PLATELET
BASO%: 0.1 % (ref 0.0–2.0)
Basophils Absolute: 0 10e3/uL (ref 0.0–0.1)
EOS%: 2.6 % (ref 0.0–7.0)
Eosinophils Absolute: 0.1 10e3/uL (ref 0.0–0.5)
HCT: 37.6 % — ABNORMAL LOW (ref 38.4–49.9)
HGB: 13.2 g/dL (ref 13.0–17.1)
LYMPH%: 14.3 % (ref 14.0–49.0)
MCH: 33.6 pg — ABNORMAL HIGH (ref 27.2–33.4)
MCHC: 35.1 g/dL (ref 32.0–36.0)
MCV: 95.8 fL (ref 79.3–98.0)
MONO#: 0.3 10e3/uL (ref 0.1–0.9)
MONO%: 5.3 % (ref 0.0–14.0)
NEUT#: 4 10e3/uL (ref 1.5–6.5)
NEUT%: 77.7 % — ABNORMAL HIGH (ref 39.0–75.0)
Platelets: 143 10e3/uL (ref 140–400)
RBC: 3.93 10e6/uL — ABNORMAL LOW (ref 4.20–5.82)
RDW: 15.3 % — ABNORMAL HIGH (ref 11.0–14.6)
WBC: 5.2 10e3/uL (ref 4.0–10.3)
lymph#: 0.7 10e3/uL — ABNORMAL LOW (ref 0.9–3.3)

## 2009-01-06 LAB — COMPREHENSIVE METABOLIC PANEL
AST: 17 U/L (ref 0–37)
Albumin: 4 g/dL (ref 3.5–5.2)
BUN: 7 mg/dL (ref 6–23)
CO2: 27 mEq/L (ref 19–32)
Calcium: 9.2 mg/dL (ref 8.4–10.5)
Chloride: 99 mEq/L (ref 96–112)
Creatinine, Ser: 0.98 mg/dL (ref 0.40–1.50)
Glucose, Bld: 160 mg/dL — ABNORMAL HIGH (ref 70–99)
Potassium: 2.9 mEq/L — ABNORMAL LOW (ref 3.5–5.3)

## 2009-01-09 ENCOUNTER — Inpatient Hospital Stay (HOSPITAL_COMMUNITY): Admission: AD | Admit: 2009-01-09 | Discharge: 2009-01-12 | Payer: Self-pay | Admitting: Oncology

## 2009-01-09 ENCOUNTER — Ambulatory Visit: Payer: Self-pay | Admitting: Critical Care Medicine

## 2009-01-09 ENCOUNTER — Ambulatory Visit: Payer: Self-pay | Admitting: Oncology

## 2009-01-10 ENCOUNTER — Ambulatory Visit: Payer: Self-pay | Admitting: Internal Medicine

## 2009-01-10 LAB — PROTEIN ELECTROPHORESIS, SERUM
Albumin ELP: 61.1 % (ref 55.8–66.1)
Alpha-1-Globulin: 7.4 % — ABNORMAL HIGH (ref 2.9–4.9)
Alpha-2-Globulin: 13.6 % — ABNORMAL HIGH (ref 7.1–11.8)
Beta 2: 5.3 % (ref 3.2–6.5)
Gamma Globulin: 5.5 % — ABNORMAL LOW (ref 11.1–18.8)
Total Protein, Serum Electrophoresis: 6.6 g/dL (ref 6.0–8.3)

## 2009-01-31 ENCOUNTER — Encounter: Payer: Self-pay | Admitting: Pulmonary Disease

## 2009-01-31 LAB — CBC WITH DIFFERENTIAL/PLATELET
Basophils Absolute: 0 10*3/uL (ref 0.0–0.1)
Eosinophils Absolute: 0.7 10*3/uL — ABNORMAL HIGH (ref 0.0–0.5)
HCT: 39.8 % (ref 38.4–49.9)
HGB: 14 g/dL (ref 13.0–17.1)
LYMPH%: 33 % (ref 14.0–49.0)
MCV: 94.6 fL (ref 79.3–98.0)
MONO%: 5.5 % (ref 0.0–14.0)
NEUT#: 2.8 10*3/uL (ref 1.5–6.5)
NEUT%: 48.9 % (ref 39.0–75.0)
Platelets: 147 10*3/uL (ref 140–400)
RBC: 4.21 10*6/uL (ref 4.20–5.82)

## 2009-01-31 LAB — BASIC METABOLIC PANEL
BUN: 12 mg/dL (ref 6–23)
Calcium: 9.5 mg/dL (ref 8.4–10.5)
Glucose, Bld: 111 mg/dL — ABNORMAL HIGH (ref 70–99)

## 2009-02-15 ENCOUNTER — Ambulatory Visit: Payer: Self-pay | Admitting: Oncology

## 2009-02-17 LAB — COMPREHENSIVE METABOLIC PANEL
ALT: 41 U/L (ref 0–53)
AST: 23 U/L (ref 0–37)
Albumin: 4.3 g/dL (ref 3.5–5.2)
Alkaline Phosphatase: 41 U/L (ref 39–117)
Glucose, Bld: 133 mg/dL — ABNORMAL HIGH (ref 70–99)
Potassium: 3.3 mEq/L — ABNORMAL LOW (ref 3.5–5.3)
Sodium: 140 mEq/L (ref 135–145)
Total Protein: 6.2 g/dL (ref 6.0–8.3)

## 2009-02-17 LAB — CBC WITH DIFFERENTIAL/PLATELET
Eosinophils Absolute: 0.1 10*3/uL (ref 0.0–0.5)
MCV: 94.3 fL (ref 79.3–98.0)
MONO%: 8.5 % (ref 0.0–14.0)
NEUT#: 2 10*3/uL (ref 1.5–6.5)
RBC: 4.01 10*6/uL — ABNORMAL LOW (ref 4.20–5.82)
RDW: 15.8 % — ABNORMAL HIGH (ref 11.0–14.6)
WBC: 3.9 10*3/uL — ABNORMAL LOW (ref 4.0–10.3)

## 2009-03-16 ENCOUNTER — Encounter: Payer: Self-pay | Admitting: Pulmonary Disease

## 2009-03-16 LAB — CBC WITH DIFFERENTIAL/PLATELET
Eosinophils Absolute: 0.5 10*3/uL (ref 0.0–0.5)
MONO#: 0.3 10*3/uL (ref 0.1–0.9)
MONO%: 9.4 % (ref 0.0–14.0)
NEUT#: 1.6 10*3/uL (ref 1.5–6.5)
RBC: 3.95 10*6/uL — ABNORMAL LOW (ref 4.20–5.82)
RDW: 15 % — ABNORMAL HIGH (ref 11.0–14.6)
WBC: 3.4 10*3/uL — ABNORMAL LOW (ref 4.0–10.3)

## 2009-03-21 LAB — COMPREHENSIVE METABOLIC PANEL
Albumin: 4.2 g/dL (ref 3.5–5.2)
Alkaline Phosphatase: 38 U/L — ABNORMAL LOW (ref 39–117)
Glucose, Bld: 118 mg/dL — ABNORMAL HIGH (ref 70–99)
Potassium: 3.9 mEq/L (ref 3.5–5.3)
Sodium: 138 mEq/L (ref 135–145)
Total Protein: 6.2 g/dL (ref 6.0–8.3)

## 2009-03-21 LAB — PROTEIN ELECTROPHORESIS, SERUM
Alpha-1-Globulin: 4.7 % (ref 2.9–4.9)
Alpha-2-Globulin: 12.4 % — ABNORMAL HIGH (ref 7.1–11.8)
Beta 2: 6.5 % (ref 3.2–6.5)
Beta Globulin: 7.4 % — ABNORMAL HIGH (ref 4.7–7.2)
Gamma Globulin: 5.5 % — ABNORMAL LOW (ref 11.1–18.8)

## 2009-03-21 LAB — KAPPA/LAMBDA LIGHT CHAINS: Lambda Free Lght Chn: 0.87 mg/dL (ref 0.57–2.63)

## 2009-03-30 ENCOUNTER — Ambulatory Visit: Payer: Self-pay | Admitting: Oncology

## 2009-03-30 ENCOUNTER — Encounter: Payer: Self-pay | Admitting: Pulmonary Disease

## 2009-03-30 LAB — CBC WITH DIFFERENTIAL/PLATELET
Basophils Absolute: 0 10*3/uL (ref 0.0–0.1)
HCT: 40.1 % (ref 38.4–49.9)
HGB: 14.1 g/dL (ref 13.0–17.1)
MCH: 33.5 pg — ABNORMAL HIGH (ref 27.2–33.4)
MONO#: 0.3 10*3/uL (ref 0.1–0.9)
NEUT%: 77.4 % — ABNORMAL HIGH (ref 39.0–75.0)
Platelets: 169 10*3/uL (ref 140–400)
WBC: 6.2 10*3/uL (ref 4.0–10.3)
lymph#: 1 10*3/uL (ref 0.9–3.3)

## 2009-04-11 ENCOUNTER — Encounter: Payer: Self-pay | Admitting: Pulmonary Disease

## 2009-04-11 LAB — CBC WITH DIFFERENTIAL/PLATELET
Basophils Absolute: 0 10*3/uL (ref 0.0–0.1)
EOS%: 13.1 % — ABNORMAL HIGH (ref 0.0–7.0)
HCT: 37.3 % — ABNORMAL LOW (ref 38.4–49.9)
HGB: 13.3 g/dL (ref 13.0–17.1)
LYMPH%: 30.5 % (ref 14.0–49.0)
MCH: 33.4 pg (ref 27.2–33.4)
MCV: 93.3 fL (ref 79.3–98.0)
MONO%: 8.5 % (ref 0.0–14.0)
NEUT%: 47 % (ref 39.0–75.0)

## 2009-04-11 LAB — BASIC METABOLIC PANEL
BUN: 11 mg/dL (ref 6–23)
Chloride: 105 mEq/L (ref 96–112)
Creatinine, Ser: 0.94 mg/dL (ref 0.40–1.50)

## 2009-04-28 LAB — BASIC METABOLIC PANEL
BUN: 9 mg/dL (ref 6–23)
Calcium: 9.6 mg/dL (ref 8.4–10.5)
Creatinine, Ser: 1.09 mg/dL (ref 0.40–1.50)

## 2009-04-28 LAB — CBC WITH DIFFERENTIAL/PLATELET
Basophils Absolute: 0 10*3/uL (ref 0.0–0.1)
EOS%: 0.1 % (ref 0.0–7.0)
HCT: 40.7 % (ref 38.4–49.9)
HGB: 14.4 g/dL (ref 13.0–17.1)
MCH: 33.5 pg — ABNORMAL HIGH (ref 27.2–33.4)
MCV: 94.7 fL (ref 79.3–98.0)
MONO%: 0.2 % (ref 0.0–14.0)
NEUT%: 91.7 % — ABNORMAL HIGH (ref 39.0–75.0)

## 2009-05-16 ENCOUNTER — Encounter: Payer: Self-pay | Admitting: Pulmonary Disease

## 2009-05-24 ENCOUNTER — Ambulatory Visit: Payer: Self-pay | Admitting: Oncology

## 2009-05-26 ENCOUNTER — Encounter: Payer: Self-pay | Admitting: Pulmonary Disease

## 2009-06-16 ENCOUNTER — Encounter: Payer: Self-pay | Admitting: Pulmonary Disease

## 2009-06-16 LAB — CBC WITH DIFFERENTIAL/PLATELET
BASO%: 2.8 % — ABNORMAL HIGH (ref 0.0–2.0)
Eosinophils Absolute: 0 10*3/uL (ref 0.0–0.5)
LYMPH%: 10.3 % — ABNORMAL LOW (ref 14.0–49.0)
MCHC: 36.2 g/dL — ABNORMAL HIGH (ref 32.0–36.0)
MCV: 92 fL (ref 79.3–98.0)
MONO#: 0.1 10*3/uL (ref 0.1–0.9)
MONO%: 1.5 % (ref 0.0–14.0)
NEUT#: 3.3 10*3/uL (ref 1.5–6.5)
Platelets: 178 10*3/uL (ref 140–400)
RBC: 4.23 10*6/uL (ref 4.20–5.82)
RDW: 14.9 % — ABNORMAL HIGH (ref 11.0–14.6)
WBC: 3.9 10*3/uL — ABNORMAL LOW (ref 4.0–10.3)

## 2009-06-16 LAB — COMPREHENSIVE METABOLIC PANEL
ALT: 31 U/L (ref 0–53)
Albumin: 4.4 g/dL (ref 3.5–5.2)
Alkaline Phosphatase: 41 U/L (ref 39–117)
Potassium: 3.8 mEq/L (ref 3.5–5.3)
Sodium: 139 mEq/L (ref 135–145)
Total Bilirubin: 1.4 mg/dL — ABNORMAL HIGH (ref 0.3–1.2)
Total Protein: 7 g/dL (ref 6.0–8.3)

## 2009-07-11 ENCOUNTER — Ambulatory Visit: Payer: Self-pay | Admitting: Oncology

## 2009-07-13 LAB — BASIC METABOLIC PANEL
CO2: 24 mEq/L (ref 19–32)
Calcium: 8.9 mg/dL (ref 8.4–10.5)
Chloride: 106 mEq/L (ref 96–112)
Glucose, Bld: 125 mg/dL — ABNORMAL HIGH (ref 70–99)
Potassium: 3.3 mEq/L — ABNORMAL LOW (ref 3.5–5.3)
Sodium: 139 mEq/L (ref 135–145)

## 2009-07-13 LAB — CBC WITH DIFFERENTIAL/PLATELET
Basophils Absolute: 0.2 10*3/uL — ABNORMAL HIGH (ref 0.0–0.1)
Eosinophils Absolute: 0.3 10*3/uL (ref 0.0–0.5)
HGB: 13.2 g/dL (ref 13.0–17.1)
MCV: 90.9 fL (ref 79.3–98.0)
MONO#: 0.5 10*3/uL (ref 0.1–0.9)
MONO%: 11.8 % (ref 0.0–14.0)
NEUT#: 2.1 10*3/uL (ref 1.5–6.5)
RDW: 14.6 % (ref 11.0–14.6)
WBC: 4.6 10*3/uL (ref 4.0–10.3)
nRBC: 0 % (ref 0–0)

## 2009-07-18 LAB — PROTEIN ELECTROPHORESIS, SERUM
Alpha-1-Globulin: 5.9 % — ABNORMAL HIGH (ref 2.9–4.9)
Alpha-2-Globulin: 13.4 % — ABNORMAL HIGH (ref 7.1–11.8)
Beta 2: 4.5 % (ref 3.2–6.5)
Beta Globulin: 7.6 % — ABNORMAL HIGH (ref 4.7–7.2)
Gamma Globulin: 6.4 % — ABNORMAL LOW (ref 11.1–18.8)

## 2009-07-20 LAB — IMMUNOFIXATION ELECTROPHORESIS
IgA: 156 mg/dL (ref 68–378)
IgG (Immunoglobin G), Serum: 484 mg/dL — ABNORMAL LOW (ref 694–1618)
IgM, Serum: 38 mg/dL — ABNORMAL LOW (ref 60–263)
Total Protein, Serum Electrophoresis: 6.6 g/dL (ref 6.0–8.3)

## 2009-08-11 ENCOUNTER — Ambulatory Visit: Payer: Self-pay | Admitting: Oncology

## 2009-08-11 ENCOUNTER — Encounter: Payer: Self-pay | Admitting: Pulmonary Disease

## 2009-08-11 LAB — CBC WITH DIFFERENTIAL/PLATELET
BASO%: 0.6 % (ref 0.0–2.0)
Basophils Absolute: 0 10*3/uL (ref 0.0–0.1)
Eosinophils Absolute: 0.2 10*3/uL (ref 0.0–0.5)
HCT: 41 % (ref 38.4–49.9)
HGB: 14.3 g/dL (ref 13.0–17.1)
MONO#: 0.3 10*3/uL (ref 0.1–0.9)
NEUT#: 2.3 10*3/uL (ref 1.5–6.5)
NEUT%: 58.5 % (ref 39.0–75.0)
Platelets: 204 10*3/uL (ref 140–400)
WBC: 3.9 10*3/uL — ABNORMAL LOW (ref 4.0–10.3)
lymph#: 1.1 10*3/uL (ref 0.9–3.3)

## 2009-08-15 DIAGNOSIS — J069 Acute upper respiratory infection, unspecified: Secondary | ICD-10-CM

## 2009-08-15 HISTORY — DX: Acute upper respiratory infection, unspecified: J06.9

## 2009-08-15 LAB — PROTEIN ELECTROPHORESIS, SERUM
Beta 2: 3.7 % (ref 3.2–6.5)
Beta Globulin: 7.1 % (ref 4.7–7.2)
Total Protein, Serum Electrophoresis: 6.7 g/dL (ref 6.0–8.3)

## 2009-08-15 LAB — COMPREHENSIVE METABOLIC PANEL
ALT: 25 U/L (ref 0–53)
CO2: 23 mEq/L (ref 19–32)
Calcium: 9.4 mg/dL (ref 8.4–10.5)
Chloride: 105 mEq/L (ref 96–112)
Creatinine, Ser: 0.94 mg/dL (ref 0.40–1.50)
Glucose, Bld: 123 mg/dL — ABNORMAL HIGH (ref 70–99)

## 2009-08-15 LAB — KAPPA/LAMBDA LIGHT CHAINS
Kappa free light chain: <0.6 mg/dL (ref 0.33–1.94)
Lambda Free Lght Chn: 0.64 mg/dL (ref 0.57–2.63)

## 2009-08-31 ENCOUNTER — Encounter: Payer: Self-pay | Admitting: Pulmonary Disease

## 2009-08-31 ENCOUNTER — Ambulatory Visit (HOSPITAL_COMMUNITY): Admission: RE | Admit: 2009-08-31 | Discharge: 2009-08-31 | Payer: Self-pay | Admitting: Oncology

## 2009-11-02 ENCOUNTER — Ambulatory Visit: Payer: Self-pay | Admitting: Oncology

## 2009-11-03 ENCOUNTER — Encounter: Payer: Self-pay | Admitting: Pulmonary Disease

## 2009-11-03 LAB — CBC WITH DIFFERENTIAL/PLATELET
Basophils Absolute: 0 10*3/uL (ref 0.0–0.1)
EOS%: 2.4 % (ref 0.0–7.0)
Eosinophils Absolute: 0.1 10*3/uL (ref 0.0–0.5)
LYMPH%: 27.7 % (ref 14.0–49.0)
MCH: 33.1 pg (ref 27.2–33.4)
MCV: 94.9 fL (ref 79.3–98.0)
MONO%: 6.1 % (ref 0.0–14.0)
NEUT#: 3 10*3/uL (ref 1.5–6.5)
Platelets: 206 10*3/uL (ref 140–400)
RBC: 4.55 10*6/uL (ref 4.20–5.82)
RDW: 13.9 % (ref 11.0–14.6)

## 2009-11-07 LAB — SPEP & IFE WITH QIG
Beta 2: 4.8 % (ref 3.2–6.5)
Gamma Globulin: 7.7 % — ABNORMAL LOW (ref 11.1–18.8)
IgA: 115 mg/dL (ref 68–378)
IgG (Immunoglobin G), Serum: 567 mg/dL — ABNORMAL LOW (ref 694–1618)
IgM, Serum: 35 mg/dL — ABNORMAL LOW (ref 60–263)

## 2009-11-07 LAB — COMPREHENSIVE METABOLIC PANEL
AST: 20 U/L (ref 0–37)
Alkaline Phosphatase: 42 U/L (ref 39–117)
BUN: 11 mg/dL (ref 6–23)
Glucose, Bld: 115 mg/dL — ABNORMAL HIGH (ref 70–99)
Total Bilirubin: 0.6 mg/dL (ref 0.3–1.2)

## 2009-11-07 LAB — KAPPA/LAMBDA LIGHT CHAINS: Lambda Free Lght Chn: 0.77 mg/dL (ref 0.57–2.63)

## 2010-02-14 ENCOUNTER — Ambulatory Visit: Payer: Self-pay | Admitting: Oncology

## 2010-02-16 ENCOUNTER — Encounter: Payer: Self-pay | Admitting: Pulmonary Disease

## 2010-02-16 LAB — CBC WITH DIFFERENTIAL/PLATELET
BASO%: 0.4 % (ref 0.0–2.0)
Basophils Absolute: 0 10*3/uL (ref 0.0–0.1)
Eosinophils Absolute: 0.1 10*3/uL (ref 0.0–0.5)
HCT: 42.6 % (ref 38.4–49.9)
HGB: 14.9 g/dL (ref 13.0–17.1)
LYMPH%: 28.9 % (ref 14.0–49.0)
MCHC: 35 g/dL (ref 32.0–36.0)
MONO#: 0.3 10*3/uL (ref 0.1–0.9)
NEUT%: 63.4 % (ref 39.0–75.0)
Platelets: 220 10*3/uL (ref 140–400)
WBC: 5.5 10*3/uL (ref 4.0–10.3)
lymph#: 1.6 10*3/uL (ref 0.9–3.3)

## 2010-02-20 LAB — KAPPA/LAMBDA LIGHT CHAINS
Kappa free light chain: 0.65 mg/dL (ref 0.33–1.94)
Kappa:Lambda Ratio: 1 (ref 0.26–1.65)
Lambda Free Lght Chn: 0.65 mg/dL (ref 0.57–2.63)

## 2010-02-20 LAB — SPEP & IFE WITH QIG
Alpha-2-Globulin: 12.3 % — ABNORMAL HIGH (ref 7.1–11.8)
Beta 2: 5.2 % (ref 3.2–6.5)
Beta Globulin: 6.4 % (ref 4.7–7.2)
IgA: 119 mg/dL (ref 68–378)
IgG (Immunoglobin G), Serum: 661 mg/dL — ABNORMAL LOW (ref 694–1618)
Total Protein, Serum Electrophoresis: 6.9 g/dL (ref 6.0–8.3)

## 2010-02-20 LAB — COMPREHENSIVE METABOLIC PANEL
ALT: 26 U/L (ref 0–53)
BUN: 11 mg/dL (ref 6–23)
CO2: 22 mEq/L (ref 19–32)
Calcium: 9.7 mg/dL (ref 8.4–10.5)
Chloride: 102 mEq/L (ref 96–112)
Creatinine, Ser: 1.02 mg/dL (ref 0.40–1.50)
Glucose, Bld: 105 mg/dL — ABNORMAL HIGH (ref 70–99)
Total Bilirubin: 0.7 mg/dL (ref 0.3–1.2)

## 2010-05-23 ENCOUNTER — Ambulatory Visit: Payer: Self-pay | Admitting: Oncology

## 2010-05-25 ENCOUNTER — Encounter: Payer: Self-pay | Admitting: Pulmonary Disease

## 2010-08-14 NOTE — Letter (Signed)
Summary: [facility]  [facility]   Imported By: Lennie Odor 06/08/2010 14:30:07  _____________________________________________________________________  External Attachment:    Type:   Image     Comment:   External Document

## 2010-08-14 NOTE — Letter (Signed)
[facility]   Imported By: Sherian Rein 09/21/2009 15:11:42  _____________________________________________________________________  External Attachment:    Type:   Image     Comment:   External Document

## 2010-08-14 NOTE — Letter (Signed)
[facility]   Imported By: Sherian Rein 11/27/2009 10:54:44  _____________________________________________________________________  External Attachment:    Type:   Image     Comment:   External Document

## 2010-08-14 NOTE — Letter (Signed)
[facility]   Imported By: Lennie Odor 03/13/2010 14:53:56  _____________________________________________________________________  External Attachment:    Type:   Image     Comment:   External Document

## 2010-08-14 NOTE — Letter (Signed)
[facility]   Imported By: Sherian Rein 09/21/2009 15:06:15  _____________________________________________________________________  External Attachment:    Type:   Image     Comment:   External Document

## 2010-09-14 ENCOUNTER — Other Ambulatory Visit: Payer: Self-pay | Admitting: Oncology

## 2010-09-14 ENCOUNTER — Encounter (HOSPITAL_BASED_OUTPATIENT_CLINIC_OR_DEPARTMENT_OTHER): Payer: BC Managed Care – PPO | Admitting: Oncology

## 2010-09-14 DIAGNOSIS — C9000 Multiple myeloma not having achieved remission: Secondary | ICD-10-CM

## 2010-09-14 DIAGNOSIS — E876 Hypokalemia: Secondary | ICD-10-CM

## 2010-09-14 DIAGNOSIS — R945 Abnormal results of liver function studies: Secondary | ICD-10-CM

## 2010-09-14 LAB — CBC WITH DIFFERENTIAL/PLATELET
BASO%: 0.4 % (ref 0.0–2.0)
EOS%: 2.3 % (ref 0.0–7.0)
HCT: 44.8 % (ref 38.4–49.9)
MCH: 32.3 pg (ref 27.2–33.4)
MCHC: 34.7 g/dL (ref 32.0–36.0)
NEUT%: 61.9 % (ref 39.0–75.0)
RBC: 4.82 10*6/uL (ref 4.20–5.82)
RDW: 13.9 % (ref 11.0–14.6)
lymph#: 1.6 10*3/uL (ref 0.9–3.3)

## 2010-09-18 LAB — SPEP & IFE WITH QIG
IgA: 135 mg/dL (ref 68–378)
IgG (Immunoglobin G), Serum: 750 mg/dL (ref 694–1618)
Total Protein, Serum Electrophoresis: 7.7 g/dL (ref 6.0–8.3)

## 2010-09-18 LAB — COMPREHENSIVE METABOLIC PANEL
ALT: 43 U/L (ref 0–53)
AST: 24 U/L (ref 0–37)
Calcium: 9.3 mg/dL (ref 8.4–10.5)
Chloride: 103 mEq/L (ref 96–112)
Creatinine, Ser: 1.08 mg/dL (ref 0.40–1.50)
Sodium: 140 mEq/L (ref 135–145)

## 2010-10-03 ENCOUNTER — Telehealth: Payer: Self-pay | Admitting: Pulmonary Disease

## 2010-10-03 MED ORDER — HYDROCHLOROTHIAZIDE 25 MG PO TABS: 25.0000 mg | ORAL_TABLET | Freq: Every day | ORAL | Status: AC

## 2010-10-03 NOTE — Telephone Encounter (Signed)
Refill sent for # 30. Pt aware needs appt for more refills.

## 2010-10-19 ENCOUNTER — Ambulatory Visit (HOSPITAL_COMMUNITY)
Admission: RE | Admit: 2010-10-19 | Discharge: 2010-10-19 | Disposition: A | Discharge status: Home / Self Care | Payer: BC Managed Care – PPO | Source: Ambulatory Visit | Attending: Oncology | Admitting: Oncology

## 2010-10-19 DIAGNOSIS — C9000 Multiple myeloma not having achieved remission: Secondary | ICD-10-CM | POA: Insufficient documentation

## 2010-10-21 LAB — DIFFERENTIAL
Basophils Absolute: 0 10*3/uL (ref 0.0–0.1)
Lymphocytes Relative: 25 % (ref 12–46)
Monocytes Absolute: 0.3 10*3/uL (ref 0.1–1.0)
Neutro Abs: 1.4 10*3/uL — ABNORMAL LOW (ref 1.7–7.7)

## 2010-10-21 LAB — CBC
Hemoglobin: 10.3 g/dL — ABNORMAL LOW (ref 13.0–17.0)
RDW: 15.3 % (ref 11.5–15.5)

## 2010-10-21 LAB — BASIC METABOLIC PANEL
Calcium: 7.9 mg/dL — ABNORMAL LOW (ref 8.4–10.5)
GFR calc Af Amer: >60 mL/min (ref >60)
GFR calc non Af Amer: >60 mL/min (ref >60)
Glucose, Bld: 107 mg/dL — ABNORMAL HIGH (ref 70–99)
Sodium: 139 mEq/L (ref 135–145)

## 2010-10-22 LAB — DIFFERENTIAL
Basophils Relative: 0 % (ref 0–1)
Basophils Relative: 1 % (ref 0–1)
Eosinophils Absolute: 0 10*3/uL (ref 0.0–0.7)
Lymphocytes Relative: 24 % (ref 12–46)
Lymphs Abs: 0.6 10*3/uL — ABNORMAL LOW (ref 0.7–4.0)
Monocytes Absolute: 0.4 10*3/uL (ref 0.1–1.0)
Monocytes Relative: 12 % (ref 3–12)
Monocytes Relative: 13 % — ABNORMAL HIGH (ref 3–12)
Neutro Abs: 1.3 10*3/uL — ABNORMAL LOW (ref 1.7–7.7)

## 2010-10-22 LAB — EXPECTORATED SPUTUM ASSESSMENT W GRAM STAIN, RFLX TO RESP C

## 2010-10-22 LAB — URINALYSIS, ROUTINE W REFLEX MICROSCOPIC
Bilirubin Urine: NEGATIVE
Glucose, UA: NEGATIVE mg/dL
Ketones, ur: NEGATIVE mg/dL
Leukocytes, UA: NEGATIVE
Protein, ur: NEGATIVE mg/dL

## 2010-10-22 LAB — VANCOMYCIN, TROUGH
Vancomycin Tr: 11.9 ug/mL (ref 10.0–20.0)
Vancomycin Tr: 30 ug/mL (ref 10.0–20.0)

## 2010-10-22 LAB — COMPREHENSIVE METABOLIC PANEL
ALT: 21 U/L (ref 0–53)
Albumin: 3.6 g/dL (ref 3.5–5.2)
Alkaline Phosphatase: 36 U/L — ABNORMAL LOW (ref 39–117)
GFR calc Af Amer: >60 mL/min (ref >60)
Potassium: 3.7 mEq/L (ref 3.5–5.1)
Sodium: 134 mEq/L — ABNORMAL LOW (ref 135–145)
Total Protein: 6.7 g/dL (ref 6.0–8.3)

## 2010-10-22 LAB — BASIC METABOLIC PANEL
BUN: 8 mg/dL (ref 6–23)
Chloride: 111 mEq/L (ref 96–112)
GFR calc non Af Amer: >60 mL/min (ref >60)
Glucose, Bld: 113 mg/dL — ABNORMAL HIGH (ref 70–99)
Potassium: 4 mEq/L (ref 3.5–5.1)
Sodium: 137 mEq/L (ref 135–145)

## 2010-10-22 LAB — URINE MICROSCOPIC-ADD ON

## 2010-10-22 LAB — CULTURE, RESPIRATORY W GRAM STAIN

## 2010-10-22 LAB — CBC
HCT: 34.3 % — ABNORMAL LOW (ref 39.0–52.0)
Hemoglobin: 12.1 g/dL — ABNORMAL LOW (ref 13.0–17.0)
MCV: 94.6 fL (ref 78.0–100.0)
Platelets: 149 10*3/uL — ABNORMAL LOW (ref 150–400)
Platelets: 177 10*3/uL (ref 150–400)
RDW: 15.4 % (ref 11.5–15.5)
RDW: 15.5 % (ref 11.5–15.5)
WBC: 2.3 10*3/uL — ABNORMAL LOW (ref 4.0–10.5)

## 2010-10-22 LAB — CULTURE, BLOOD (ROUTINE X 2)

## 2010-10-22 LAB — URINE CULTURE
Colony Count: NO GROWTH
Culture: NO GROWTH
Special Requests: NEGATIVE

## 2010-11-01 ENCOUNTER — Other Ambulatory Visit: Payer: Self-pay | Admitting: Oncology

## 2010-11-09 ENCOUNTER — Telehealth: Payer: Self-pay | Admitting: Pulmonary Disease

## 2010-11-09 NOTE — Telephone Encounter (Signed)
Pt aware we cannot fill HCTZ until he comes in for OV on 12/17/2010 w/ SN. Pt has not been seen since 10/10/2008. Pt had verbalized understanding.

## 2010-11-27 NOTE — H&P (Signed)
NAME:  Chad [name], Chad [name]                     ACCOUNT NO.:  [id number]   MEDICAL RECORD NO.:  [id number]          PATIENT TYPE:  OUT   LOCATION:  XRAY                         FACILITY:  [facility]   PHYSICIAN:  Chad Roach. Truett [name], M.D. DATE OF BIRTH:  12-31-1955   DATE OF ADMISSION:  01/06/2009  DATE OF DISCHARGE:  01/06/2009                              HISTORY & PHYSICAL   PATIENT IDENTIFICATION:  [name] is a 55 year old with multiple myeloma.  He is now admitted for treatment of pneumonia.   HISTORY OF PRESENT ILLNESS:  [name] was diagnosed with multiple myeloma  in 2004.   He underwent treatment with high-dose chemotherapy and autologous stem  cell support in 2004.   He remained in remission until the fall of 2009 when the serum free  light chains and serum M-spike began to rise.  A bone marrow biopsy at  [facility] in November of 2009 confirmed 10% to 20% plasma cells.   He began treatment with Revlimid and weekly Decadron in early 2010 and  he has completed 6 cycles to date.  A repeat serum free light chain  analysis and serum protein electrophoresis from January 06, 2009, is  pending.   [name] presented on January 06, 2009, with a high fever and cough.  Clinical examination and a chest x-ray were consistent with a left lung  pneumonia.  [name] HAS MULTIPLE ANTIBIOTIC ALLERGIES.  He was placed on  Bactrim and azithromycin.   He reports a persistent cough and fever for the past several days.  His  fever was elevated to 103.9 degrees this morning.  He has developed  increased malaise.  The cough is occasionally productive of a green  sputum.  He denies shortness of breath and pain.   PAST MEDICAL HISTORY:  1. Multiple myeloma as per HPI, currently being treated with      Revlimid/weekly Decadron.  The most recent cycle of Revlimid was      completed on January 06, 2009.  2. Right lung pneumonia, June 2009, resolved after treatment with      Bactrim and azithromycin.  3. History of hypokalemia -  Maintained on a potassium supplement.  4. History of elevated liver enzymes - Normal on recent chemistry      panels.  5. 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   CURRENT MEDICATIONS:  1. Bactrim DS one b.i.d.  2. Azithromycin 250 mg daily.  3. Potassium chloride 40 mEq b.i.d.  4. Delsym 60 mg every 12 hours.  5. Mucinex one b.i.d. p.r.n.  6. Decadron 40 mg weekly.  7. Revlimid 25 mg daily, completed on January 06, 2009.  8. Aspirin 81 mg daily.  9. Hydrochlorothiazide 25 mg daily.  10.Vytorin 10/20 daily.   ALLERGIES:  1. IMIPENEM CAUSED HIVES.  2. PENICILLIN.  3. CEPHALOSPORINS.  4. AVELOX CAUSED LIP SWELLING.   FAMILY HISTORY:  Noncontributory.   SOCIAL HISTORY:  He lives with his wife in [place].   REVIEW OF SYSTEMS:  CONSTITUTIONAL:  He  reports a fever beginning on  January 06, 2009, and anorexia.  RESPIRATORY:  He has a cough that is  intermittently productive.  No shortness of breath.  CARDIAC:  Negative.  GU:  Negative.  GI:  Negative.  NEUROLOGIC:  He has mild intermittent  numbness and tingling in the feet.   EXAM:  Pressure 125/88.  Pulse 88.  Temperature 98.4.  Oxygen saturation  92% on room air.  HEENT:  There is a white coat over the tongue.  No buccal thrush.  No  ulcers.  The pharynx is without erythema or exudate.  LUNGS:  Inspiratory rhonchi throughout the left lower chest.  CARDIAC:  Regular rhythm.  Distant heart sounds.  ABDOMEN:  No hepatosplenomegaly.  Nontender.  EXTREMITIES:  No edema.   LABS:  From January 06, 2009, hemoglobin 13.2, platelets 143,000, white  count 5.2, ANC 4.0, potassium 2.9, BUN 7, creatinine 0.98.  Labs today  pending.   IMPRESSION:  1. Multiple myeloma - Currently being treated with Revlimid and weekly      Decadron, status post completion of cycle #6 on January 06, 2009.      a.     Repeat serum light chains and a serum protein       electrophoresis from January 06, 2009, are  pending.      b.     Status post high-dose chemotherapy with autologous stem cell       support in 2004.  2. Pneumonia - Confirmed on a chest x-ray January 06, 2009.  He has been      maintained on Bactrim DS and azithromycin since January 06, 2009,      without clinical improvement.  3. History of pneumonia in July 2009, resolved with Bactrim and      azithromycin.  4. MULTIPLE ANTIBIOTIC ALLERGIES.  5. Hypokalemia - Potentially related to Decadron and      hydrochlorothiazide therapy.  We obtained a chemistry panel today.   [name] has pneumonia.  He will be admitted for intravenous antibiotic  support and further diagnostic evaluation.  I will consult Dr. Kriste [name].   [name] HAS MULTIPLE ANTIBIOTIC ALLERGIES.  We will begin treatment with  vancomycin, aztreonam, and azithromycin.  We will obtain cultures of the  blood, urine, and sputum.      Chad [name], M.D.  Electronically Signed     GBS/MEDQ  D:  01/09/2009  T:  01/09/2009  Job:  [id number]   cc:   Chad [name]. Chad Basque, MD  [address]  [place]  [place] [address]   Leafy Chad [name], M.D.  [facility]/Hematology  [facility]

## 2010-11-27 NOTE — Discharge Summary (Signed)
NAME:  Chad [name], Chad [name]                     ACCOUNT NO.:  [id number]   MEDICAL RECORD NO.:  [id number]          PATIENT TYPE:  INP   LOCATION:  1342                         FACILITY:  [facility]   PHYSICIAN:  Leighton Roach. Truett Perna, M.D. DATE OF BIRTH:  [birth year]   DATE OF ADMISSION:  01/09/2009  DATE OF DISCHARGE:  01/12/2009                               DISCHARGE SUMMARY   CONDITION ON DISCHARGE:  Improved.   DISCHARGE DIAGNOSES:  1. Left lower lobe pneumonia--clinical improvement with broad spectrum      intravenous antibiotics during this admission.      a.     Cultures of the sputum, blood, and urine were negative.  2. Multiple myeloma.      a.     Status post high-dose chemotherapy with autologous stem cell       support in 2004.      b.     Currently being treated with salvage therapy consisting of       Revlimid  and weekly Decadron.  3. Right lung pneumonia in June 2009, resolved after treatment with      Bactrim and azithromycin.  4. Multiple antibiotic allergies.  5. 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  6. Hypokalemia--maintained on a potassium supplement  7. Pancytopenia secondary to multiple myeloma and Revlimid.   HOSPITAL CONSULTATIONS:  1. Dr. Kriste Basque, Pulmonary Medicine.  2. Dr. Orvan Falconer, Infectious Disease Medicine.   HOSPITAL COURSE:  Chad [name] is a 55 year old with multiple myeloma.  He is  currently being treated with Revlimid and weekly Decadron.   He presented to the office on January 06, 2009, with a fever and cough.  A  chest x-ray confirmed a left lower lung pneumonia.  He was placed on  Bactrim and azithromycin.   He continued to have a fever and cough over the next few days and he  presented to the office for further evaluation on January 09, 2009.  He was  noted to have a borderline oxygen saturation and a persistent  fever/cough.  He was admitted for intravenous antibiotic support and  further evaluation.   Cultures of the sputum, blood, and urine were negative.  A chest x-ray  confirmed a progressive left lower lobe infiltrate.   He was placed on broad spectrum intravenous antibiotics per the  recommendation of Dr. Orvan Falconer with vancomycin, aztreonam, and  azithromycin.  He was seen in consultation by Dr. Kriste Basque and Dr.  Orvan Falconer.   His clinical status improved markedly over the first 24 to 48 hours of  this admission.  He became afebrile.  The oxygen saturation remained  adequate on room air.  He appeared stable for discharge on January 12, 2009.   He was noted to have mild neutropenia, thrombocytopenia, and anemia.  The pancytopenia is related to multiple myeloma, Revlimid, and the acute  infection.   He appeared stable for discharge on January 12, 2009.   DISCHARGE MEDICATIONS:  1. Azithromycin 500 mg daily for 4 days.  2. Clindamycin 300 mg q.i.d.  for 4 days.  3. Vytorin 10/20 daily  4. HCTZ 25 mg daily.  5. Potassium chloride 40 mEq b.i.d.   Followup care will be arranged with Dr. Truett Perna for a lab and office  visit within the next few weeks.  He is to call for a recurrent fever or  shortness of breath.      Leighton Roach Truett Perna, M.D.  Electronically Signed     GBS/MEDQ  D:  01/12/2009  T:  01/12/2009  Job:  [id number]   cc:   Lonzo Cloud. Kriste Basque, MD  [address]  [place]  [place] [address]   Cliffton Asters, M.D.  Fax: [phone]   Dr. Leafy Ro  Oncology at [facility]

## 2010-11-27 NOTE — Consult Note (Signed)
NAME:  LEDGER, HEINDL                     ACCOUNT NO.:  [id number]   MEDICAL RECORD NO.:  [id number]          PATIENT TYPE:  INP   LOCATION:  1342                         [facility]ACILITY:  [facility]   PHYSICIAN:  Charlcie Cradle. Delford Field, MD, FCCPDATE OF BIRTH:  1956/01/22   DATE OF CONSULTATION:  01/09/2009  DATE OF DISCHARGE:                                 CONSULTATION   This patient is at [facility].   MD REQUESTING CONSULT:  Leighton Roach. Truett Perna, M.D.   CHIEF COMPLAINT/REASON FOR CONSULTATION:  Pneumonia.   HISTORY OF PRESENT ILLNESS:  [name] is a very pleasant 55 year old male  with a history of multiple myeloma diagnosed in 2004 and closely  followed by Dr. Mancel Bale.  He had an appointment in the Oncology  office on Friday June 25, for his chemotherapy and was found to have an  elevated fever and cough.  The patient was prescribed azithromycin and  Bactrim and was sent home over the weekend.  The patient followed up  with Dr. Truett Perna in the office on the day of admission, June 28, and  was shown to have no improvement.  Continued to have high fevers as high  as 103 as well as cough with occasional purulent green sputum.  The  patient was admitted on June 28, by Dr. Truett Perna with pneumonia.  The  patient is a primary care patient of Dr. Alroy Dust and so PCCM was  asked to consult for assistance with management of this patient's  pneumonia.   ALLERGIES:  1. IMIPENEM.  2. PENICILLIN.  3. CEPHALOSPORIN.  4. AVELOX.   PAST MEDICAL HISTORY:  1. Multiple myeloma.  The patient is currently undergoing chemotherapy      with Revlimid.  2. CAD with a known EF of 65%.   HOME MEDICATIONS:  1. Potassium.  2. Delsym.  3. Mucinex.  4. Decadron weekly.  5. Revlimid.  6. Aspirin.  7. Hydrochlorothiazide.  8. Vytorin.   SOCIAL HISTORY:  The patient lives in [place] with his wife.  He has  never been a smoker and does not use alcohol or illicit drugs.   FAMILY HISTORY:  No known  history of pulmonary disease.   REVIEW OF SYSTEMS:  The patient does complain of fevers and chills as  well as cough.  All other systems were reviewed and were negative.   PHYSICAL EXAMINATION:  VITAL SIGNS:  Maximum temperature is 103,  currently 98.7; blood pressure 133/88; heart rate 92; respiratory rate  20; and O2 sat is 93% on room air.  GENERAL:  This is a well-developed, well-nourished pleasant male in no  acute distress.  NEUROLOGIC:  He is alert and oriented x3.  Cranial nerves are grossly  intact.  Strength is 5/5 in all extremities.  Speech is clear.  HEENT:  Mucous membranes are moist.  Pupils are equal, round, and  reactive to light.  Extraocular movements are intact.  Sclerae are  clear.  Dentition is good.  NECK:  Supple without lymphadenopathy.  No thyromegaly.  No JVD.  CARDIAC:  S1 and  S2.  Regular in rhythm.  No murmurs, rubs, or gallops.  PULMONARY:  Breaths are even and nonlabored.  LUNGS:  Decreased in the bases, left greater than right.  GI:  Abdomen is soft, nontender, and nondistended.  Bowel sounds are  active x4.  MUSCULOSKELETAL:  The patient moves all extremities.  No obvious joint  deformities.  No spine or CVA tenderness.  SKIN:  Without rashes or lesions.  EXTREMITIES:  Warm and dry.  No edema.   RADIOLOGY DATA:  Chest x-ray does show a left lower lobe infiltrate  increased from previous exam done as an outpatient on June 25.   LABORATORY DATA:  CBC:  White blood cells 3.2, hemoglobin 13.3,  hematocrit 38.8, and platelets 149.   MICRO DATA:  Urine culture is pending at the time of dictation.  Sputum  culture is also pending at the time of dictation.   IMPRESSION AND PLAN:  1. Left lower lobe pneumonia.  This is an immunocompromised patient      who also had multiple antibiotic allergies complicating his      treatment.  The patient has currently been prescribed azithromycin,      aztreonam, and vancomycin.  He did not appear to be toxic at  this      time.  He has good oxygenation.  He is alert and oriented.      Recommend continue current antibiotics, pulmonary toilet, follow up      chest x-ray, mucolytics as needed as well as supplemental oxygen to      keep his O2 sats greater than or equal to 94.  The patient appears      to be getting appropriate therapy for his pneumonia given his      immunocompromised status and his multiple antibiotic      allergies.  2. Multiple myeloma, this is per Hematology/Oncology.  All      recommendations per Dr. Truett Perna.   Thank you very much for the consultation.      Dirk Dress, NP      Charlcie Cradle. Delford Field, MD, [facility]  Electronically Signed    KW/MEDQ  D:  01/09/2009  T:  01/10/2009  Job:  [id number]   cc:   Lonzo Cloud. Kriste Basque, MD  [address]  [place]  [place] [address]   Leighton Roach Truett Perna, M.D.  Fax: [phone]

## 2010-11-30 NOTE — Cardiovascular Report (Signed)
NAME:  RAIDON, SWANNER                               ACCOUNT NO.:  [id number]   MEDICAL RECORD NO.:  [id number]                   PATIENT TYPE:  INP   LOCATION:  3710                                 FACILITY:  [facility]   PHYSICIAN:  Learta Codding, M.D. [facility]             DATE OF BIRTH:  09/14/1955   DATE OF PROCEDURE:  DATE OF DISCHARGE:  07/13/2002                              CARDIAC CATHETERIZATION   PROCEDURE:  1. Left heart catheterization with selective coronary angiography.  2. Ventriculography.   DIAGNOSIS:  Nonobstructive coronary artery disease.   INDICATIONS FOR PROCEDURE:  Patient is a 55 year old male with risk factors  for coronary artery disease.  The patient was admitted with substernal chest  pain, albeit with low risk features without electrocardiogram changes and  negative components.  He has been referred for diagnostic catheterization to  assess his coronary anatomy.  He did have a past stress which was within  normal limits.   DESCRIPTION OF PROCEDURE:  After informed consent was obtained the patient  was brought to the catheterization laboratory.  The right groin was  sterilely prepped and draped.  A 6-French arterial sheath was placed using  modified Seldinger technique.  The 6-French JR4 and JL4 catheters were used  to engage the left and right coronary ostia respectively.  A 6-French angled  pigtail catheter was used to perform ventriculography.  After termination of  procedure all catheters were removed.  Patient was brought back to the  holding area and no complications were encountered.   FINDINGS:  HEMODYNAMICS:  Left ventricular pressure 138/90 mmHg.  Aortic  pressure 138/74 mmHg.   VENTRICULOGRAPHY:  Ejection fraction 65%, no significant wall motion  abnormalities, no significant mitral regurgitation.   SELECTIVE CORONARY ANGIOGRAPHY:  1. Left main coronary artery was a large caliber vessel without evidence for     flow-limiting disease.  2. Left  anterior descending artery was a large caliber vessel which was free     of flow-limiting coronary artery disease.  3. There was a small ramus branch which demonstrated diffuse 30-40%     stenosis.  4. Circumflex was a large caliber vessel with diffuse 30-40% stenosis in the     mid segment.  On the major site the branches were free of flow-limiting     disease.  5. Right coronary artery was a large caliber vessel without evidence for     flow-limiting disease.    RECOMMENDATIONS:  The patient has no evidence for significant epicardial  coronary artery disease.  He only has mild to moderate disease in the  circumflex distribution.  The patient can be continued on medical therapy  and should pay close attention to his blood pressure.  This can be followed  by Dr. Alwyn Ren.  I also recommend aspirin 81 mg a day and the patient has  been instructed regarding exercise and diet.  Learta Codding, M.D. [facility]    GED/MEDQ  D:  07/13/2002  T:  07/14/2002  Job:  [id number]   cc:   Learta Codding, M.D. [facility]   Titus Dubin. Alwyn Ren, M.D. [facility]   Charlton Haws, M.D. [facility]

## 2010-11-30 NOTE — H&P (Signed)
NAME:  Chad [name], Chad [name]                               ACCOUNT NO.:  [id number]   MEDICAL RECORD NO.:  [id number]                   PATIENT TYPE:  INP   LOCATION:  3710                                 FACILITY:  [facility]   PHYSICIAN:  Learta Codding, M.D. [facility]             DATE OF BIRTH:  Mar 08, 1956   DATE OF ADMISSION:  07/12/2002  DATE OF DISCHARGE:                                HISTORY & PHYSICAL   REFERRING PHYSICIAN:  Titus Dubin. Alwyn Ren, M.D. [facility]   CARDIOLOGIST:  Charlton Haws, M.D. [facility]   CURRENT COMPLAINT:  Substernal chest pain.   HISTORY OF PRESENT ILLNESS:  The patient is a 55 year old male with a  history of hypertension, hyperlipidemia, and family history of coronary  artery disease who presented to the emergency room for an evaluation of  chest pain.  On admission, the patient was found to have minor nonspecific T  wave changes in the precordium leads.  He did have a prior stress test done  on October 10, 1998, that was interpreted by Charlton Haws, M.D. [facility].  It was  found to be negative for ischemia with an ejection fraction of 69%.  The  patient did have relatively poor exercise tolerance and did have a  hypertensive blood pressure response.  He now reports having developed chest  pain during the week and particularly on Saturday but worsened in the course  of last night and this morning.  He did feel that the pain worsened on  Saturday when he was physically active and exerting himself.  Also at the  time he was playing golf, he had some more chest pain.  Typically, the  episodes lasted in the order of 10-15 seconds but did feel that they were  decreasing by pattern over the last 24 hours.  But he had a prolonged  episode this morning, which prompted him to come to the emergency room.  At  the time of admission, he rated his chest pain an approximately 5/10.  He  was still having some patient at the time of his electrocardiogram that was  associated with the nonspecific T wave  changes.  The patient is currently  pain free and has no shortness of breath, orthopnea, PND, palpitations, or  syncope.   ALLERGIES:  No known drug allergies.   MEDICATIONS:  1. Norvasc 5 mg a day.  2. Aspirin, although he has not been taking aspirin recently.  3. Garlic.   PAST MEDICAL HISTORY:  1. History of hypertension.  2. No history of diabetes mellitus.  3. History of hyperlipidemia.   FAMILY HISTORY:  Positive family history for coronary artery disease.  His  father is in his 7s but has a history of a bypass in his 46s and has had  prior myocardial infarction.  Mother is in her 10s and has hypertension.  He  has a brother who is  deceased who was mentally retarded.  He has one sister  who is alive and well.   SOCIAL HISTORY:  The patient is married, lives in [place], has two  daughters.  His wife is a former Teacher, music.  The patient denies any tobacco use, occasionally drinks alcohol.  He works  for an Scientist, forensic and drives long distances.   REVIEW OF SYSTEMS:  No fever or chills.  Significant weight gain over the  last several months.  No headache or sore throat.  Positive for chest pain,  no shortness of breath or dyspnea on exertion.  No frequency or dysuria.  No  myalgias or arthralgias.  No nausea and vomiting.  No polyuria or  polydipsia.   PHYSICAL EXAMINATION:  VITAL SIGNS:  Blood pressure 171/89, heart rate 75  beats per minute, saturations 99% on room air, temperature afebrile.  GENERAL:  This is a 55 year old white male in no apparent distress.  HEENT:  Eyes: PERRL.  Conjunctivae are clear.  NECK: Supple, no carotid  upstroke, no carotid bruits.  HEART: Regular rate and rhythm, 2/6 crescendo,  decrescendo murmur at the left upper sternal border, no S3.  LUNGS:  Clear.  SKIN:  Warm and dry.  ABDOMEN:  Obese but soft and nontender.  GU/RECTAL:  Deferred.  EXTREMITIES:  Pulses intact, no edema.  NEUROLOGIC:  The patient  is grossly nonfocal.   LABORATORY DATA:  Chest x-ray: Mild cardiomegaly but no congestive heart  failure per report from radiology.  EKG: Heart rate 75 beats per minute,  diffuse T wave changes but no acute ischemic abnormalities.  Labs are  currently pending.   IMPRESSION AND PLAN:  Problem #1. Substernal chest pain.  The patient does  have multiple risk factors for coronary artery disease but his chest pain is  somewhat atypical.  However, he does have T wave changes associated with  chest pain that were new compared to a prior electrocardiogram.  Given the  fact that he already underwent a Cardiolite stress study in particular  because of his pain that is suspicious for CAD, we plan to proceed with a  cardiac catheterization.  I have discussed the risk and benefits of this  procedure with the patient and he is willing to proceed.   Problem #2. Hyperlipidemia.  We will obtain a lipid panel in the a.m.   Problem #3.  Rule out pulmonary embolus/deep venous thrombosis.  The patient  has complaint of some left ankle swelling.  He does drive long distances at  that; however, he has a DVT and will obtain a screening D-dimer.   Problem #4. Hypertension, poorly controlled.  Will start beta blocker in  addition to Norvasc.   Problem #5. Cardiac murmur.  Will obtain a 2-D echocardiogram to evaluate  murmur.  We will also rule out aortic stenosis via cardiac catheterization.   DISPOSITION:  Cardiac catheterization in the a.m.                                               Learta Codding, M.D. [facility]    GED/MEDQ  D:  07/12/2002  T:  07/12/2002  Job:  [id number]   cc:   Titus Dubin. Alwyn Ren, M.D. [facility]   Charlton Haws, M.D. [facility]

## 2010-11-30 NOTE — Discharge Summary (Signed)
NAME:  Chad [name], Chad [name]                               ACCOUNT NO.:  [id number]   MEDICAL RECORD NO.:  [id number]                   PATIENT TYPE:  INP   LOCATION:  3710                                 FACILITY:  [facility]   PHYSICIAN:  Learta Codding, M.D. [facility]             DATE OF BIRTH:  1955/10/03   DATE OF ADMISSION:  07/12/2002  DATE OF DISCHARGE:  07/13/2002                           DISCHARGE SUMMARY - REFERRING   BRIEF HISTORY:  This is a 55 year old male who presented to [facility] Emergency Room for evaluation of chest pain.  He was noted  to have electrocardiogram changes.  He has a history of Cardiolite performed  in March of 2000 that was negative for ischemia, ejection fraction of 69%.  The patient stated that he developed chest pain over the weekend.  Saturday  morning was when the patient first noticed the pain.  It was short-lived,  intermittent chest pain.  Each episode lasted five to ten seconds.  He did  play golf later that day.  He had similar pain on Sunday; however, these  symptoms were more frequent.   On the morning of discharge, he had more pain when the episode appeared to  be even more frequent.  He came to the emergency room.  The pain was  described as over his left breast, dull without radiation.  His longest  episode lasted approximately a minute.  He had never had similar symptoms  before.  These symptoms initially were related to exertion; however, they  later came on at rest.  There was no shortness of breath, diaphoresis, no  nausea.  He described the pain as four to five on a one to ten scale.  He  was seen by Dr. Learta Codding and admitted for further evaluation.   PAST MEDICAL HISTORY:  Significant for hypertension, elevated cholesterol  levels, and positive family history of coronary artery disease.  There was  no history of diabetes.  No history of tobacco use.   ALLERGIES:  No known drug allergies.   MEDICATIONS:  1. Norvasc 5 mg  q.d.  2. Baby aspirin and garlic daily; however, the patient has not been taking     the aspirin or the garlic.   SOCIAL HISTORY:  The patient is married and lives in [place].  He has two  daughters.  He does not use tobacco.  He rarely uses alcohol.  He works for  an Scientist, forensic.  He regularly drives long distances.   FAMILY HISTORY:  The patient's father is in his 73s.  He has a history of  coronary artery bypass graft surgery performed in his 64s with multiple MIs  in the past.  His mother is in her 76s and has hypertension.  He has a  sister who is alive and well.  He has a brother who died.  The patient's  brother was mentally retarded.   HOSPITAL COURSE:  As noted, the patient was admitted to [facility] for further evaluation of chest pain which was felt to be somewhat  atypical.  However, in view of his risk factors and EKG changes,  arrangements were made to perform cardiac catheterization.   The patient underwent cardiac catheterization on July 13, 2002 performed  Dr. Vernie Shanks. [place].  He was found to have a 30-40% proximal circumflex lesion  as well as a 30-40% ramus lesion.  The ejection fraction is 55%.  This was  felt to represent nonobstructive coronary artery disease and continued  medical therapy was recommended with aspirin and control of blood pressure.  The patient was discharged from the hospital later that day in improved  condition.   LABORATORY DATA:  Cardiac enzymes were negative.  Chemistry profile was  unremarkable except for a total protein of 8.9. SGPT was mildly elevated at  65.  BUN was normal at 10 and creatinine was normal at 1.0.  Potassium at  3.7.  A CBC revealed hemoglobin 13.4, hematocrit 37.7, wbc's 4.8 thousand,  platelets 254,000.   A chest x-ray showed decreased volumes with cardiomegaly but no congestive  heart failure.  His EKG showed sinus rhythm at 79 beats per minute with  diffuse T-wave inversions, some of  which were new, some were old.   DISCHARGE MEDICATIONS:  1. The patient was discharged on Norvasc 10 mg q.d.  2. Aspirin 81 mg q.d.  3. Tylenol p.r.n. pain.   ACTIVITY:  He was told not to drive or do anything strenuous for at leas two  to three days.   DIET:  He was to be on a low salt, low fat diet.   FOLLOW UP:  He was told to call the office if he had any increased pain,  swelling, or bleeding from his groin.  He was to follow up with Dr. Charlton Haws, Friday July 30, 2002 at 12 noon.  He was to see Dr. Titus Dubin.  [place]pper as needed or as scheduled.   DISCHARGE DIAGNOSES:  1. Atypical chest pain with electrocardiogram changes.  2. Cardiac catheterization performed July 13, 2002, please see results     above.  Nonobstructive coronary artery disease with normal left     ventricle.  3. History of hypertension.  4. History of elevated cholesterol levels.  5. Mild obesity.  6. Family history of coronary artery disease.  7. Murmur on cardiac exam noted.     Delton See, P.A. [facility]                  Learta Codding, M.D. [facility]    DR/MEDQ  D:  07/13/2002  T:  07/14/2002  Job:  [id number]   cc:   Titus Dubin. Alwyn Ren, M.D. [facility]

## 2010-11-30 NOTE — Discharge Summary (Signed)
NAME:  Chad [name], Chad [name]                               ACCOUNT NO.:  [id number]   MEDICAL RECORD NO.:  [id number]                   PATIENT TYPE:  INP   LOCATION:  0284                                 FACILITY:  [facility]   PHYSICIAN:  Enzo Montgomery. Filbert Berthold, M.D.               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   REASON FOR ADMISSION:  Fevers, chills, shortness of breath.   ONCOLOGIC HISTORY:  He presented with an IgG of 3.1 g/dl, monoclonal kappa,  19-JYNW urine negative, beta-2 microglobulin 8.28, serum IgG 3870, IgA 45,  IgM 22, calcium 9.2, creatinine 1.1, hemoglobin 12.8.  Bone marrow biopsy  showed 31% plasma cells.  Bone survey was negative for myeloma lesions.  He  received DVD chemotherapy q.3-4 weeks and is status post three cycles.  He  had hand-foot syndrome with his last cycle despite taking vitamin B6.   HISTORY OF PRESENT ILLNESS:  He called from [place] about a week  prior to admission with fevers and was empirically placed on Augmentin 500  mg b.i.d.  He continued to have fevers and some cough with shortness of  breath and then was started on Cipro on the 26th when he was seen in the  office.  Blood cultures, urine cultures, as well as culture of a mouth ulcer  were sent and all were negative.  A chest x-ray was obtained and that was  essentially negative.  He was seen back in at the office on July 28 with no  improvement and a CT scan showed patchy air space disease in the right upper  lung, question of an acute infiltrate with nodular densities in the right  lung.  Myelomatous lesions were also noted within the sternum, manubrium,  T3, T5, T6, T12, and L5 consistent with diffuse involvement.  Of note, he  had an excellent response to DVD with his serum IgG normalizing.   HOSPITAL COURSE:  He was admitted to  the hospital and placed on Primaxin.  Pulmonary was consulted and bronchoscopy was done immediately.  Transbronchial biopsy was negative for evidence of any malignancy.  AFB  stains were negative.  All cultures were negative and there is a Gram stain  from the bronchial washings that showed diplococci gram-positive.  He  defervesced while in the hospital, came off on oxygen, and was up and about  ambulating and feeling well.  He had no significant cough, shortness of  breath, or other problems.  Nothing grew out on culture and he is treated  presumptively for pneumococcus bacterial infection.  His hemoglobin dropped  presumably related to both the acute bacterial infection and the myeloma and  he was transfused 2 units of blood.   DISCHARGE DIAGNOSES:  1. Community acquired pneumonia, presumably pneumococcus.  2. Multiple myeloma receiving DVD chemotherapy and in the works for     autologous stem cell transplant at [facility].  3. Hypertension.  4. Hypercholesterolemia.   MEDICATIONS ON ADMISSION:  1. Norvasc 10 mg daily.  2. Lovastatin 40 mg b.i.d.  3. Lisinopril 10/12.5 daily.  4. Advil p.r.n.  5. Tylenol p.r.n.   DISCHARGE MEDICATIONS:  1. Augmentin 875 mg b.i.d. x10 days.  2. Cipro 500 mg b.i.d. x10 days.  3. Avapro 150 mg daily.  4. Stop the lovastatin, Norvasc, and lisinopril.   ALLERGIES:  No known drug allergies.   CONDITION ON DISCHARGE:  Much improved.   DISCHARGE FOLLOWUP:  He is going to change his appointment with [facility] to move it back one week and I will see him in two weeks for follow-up  from that appointment in [facility].                                               Robert C. Filbert Berthold, M.D.    RCW/MEDQ  D:  02/16/2003  T:  02/16/2003  Job:  [id number]

## 2010-11-30 NOTE — Op Note (Signed)
   NAME:  Chad [name], KAWAHARA                               ACCOUNT NO.:  [id number]   MEDICAL RECORD NO.:  [id number]                   PATIENT TYPE:  INP   LOCATION:  0284                                 FACILITY:  [facility]   PHYSICIAN:  Casimiro Needle B. Sherene Sires, M.D. [facility]           DATE OF BIRTH:  Nov 15, 1955   DATE OF PROCEDURE:  DATE OF DISCHARGE:                                 OPERATIVE REPORT   PROCEDURE:  Fiberoptic bronchoscopy with transbronchial biopsy of the right  upper lobe.   INDICATIONS FOR PROCEDURE:  Please see handwritten pulmonary consultation  note written into the hospital records. This is a 55 year old white male who  is immunocompromised on the basis of myeloma with persistent fever and  evolving infiltrates in the right upper lobe over the last 10 days despite  appropriate broad antibiotic therapy. He agreed to the procedure after a  full discussion of the risks, benefits, and alternatives.   The right naris and oropharynx were liberally anesthetized with 10%  lidocaine spray. The patient was monitored by surface ECG and oximetry and  received a total of 50 mg of IV Demerol and 5 mg of IV Versed for adequate  sedation and cough suppression.   Using a standard fiberoptic bronchoscope, the right naris was easily  cannulated with good visualization of the entire oropharynx and larynx. The  cords moved normally and there were no apparent upper airway lesions.   Using an additional 1% lidocaine as needed, the entire tracheobronchial tree  was explored bilaterally with the following findings:  There were no focal  endobronchial lesions at the subsegmental level bilaterally.   DESCRIPTION OF PROCEDURE:  Using a wedge position within several of the  subsegments of the right upper lobe, I was able to obtain four adequate  transbronchial biopsy specimens and also lavaged the segments well with  adequate return. The patient tolerated the procedure well with adequate  saturations and a  followup chest x-ray is pending.   IMPRESSION:  Pulmonary infiltrates associated with fever in an  immunocompromised patient.    RECOMMENDATIONS:  Await transbronchial biopsies for histology and special  stains if indicated as well as bronchial lavage for cytology, AFB and fungal  stain and culture, routine stain and culture, Legionella and PCP stains.                                               Charlaine Dalton. Sherene Sires, M.D. [facility]    MBW/MEDQ  D:  02/11/2003  T:  02/11/2003  Job:  [id number]   cc:   Leighton Roach. Truett Perna, M.D.  [address] [facility]  [place]  [place]  [address]  Fax: [phone]

## 2010-11-30 NOTE — H&P (Signed)
NAME:  Chad [name], Chad [name]                               ACCOUNT NO.:  [id number]   MEDICAL RECORD NO.:  [id number]                   PATIENT TYPE:  INP   LOCATION:  0440                                 FACILITY:  [facility]   PHYSICIAN:  Leighton Roach. Truett Perna, M.D.              DATE OF BIRTH:  1955-11-17   DATE OF ADMISSION:  02/10/2003  DATE OF DISCHARGE:                                HISTORY & PHYSICAL   CHIEF COMPLAINT:  Fever, cough.   HISTORY OF PRESENT ILLNESS:  Mr. Portman is a 55 year old gentleman who was  diagnosed with IgG multiple myeloma in May 2004.  The initial workup showed  serum protein electrophoresis with a 3.1 g/dl endocyte;  IFD showed  monoclonal IgG kappa protein;  a 24-hour urine was negative;  beta-2  microglobulin elevated at 8.28;  serum immunoglobulin with IgG 3870, IgA 45,  IgM 22;  total protein 10, albumin 4, calcium 9.2, creatinine 1.1,  hemoglobin 12.8;  bone marrow showed 31% plasma cells;  bone survey was  negative for myeloma changes.  He is currently on treatment with DVD and has  completed three cycles to date.  The most recent cycle was on January 14, 2003.  Lab work at that time showed a normal beta-2 microglobulin level at 1.52,  normal renal function, alb 4.8, IgG 12.20, IgA 36, IgM 20, and hemoglobin  13.1.  Overall, he has tolerated the treatment well.  On February 04, 2003, he  developed a fever of 102 degrees and was started on Augmentin.  These fevers  persisted and he was seen in the office on February 07, 2003, at which time  Cipro was started.  Blood cultures, urine culture, culture of a mouth ulcer,  and chest x-ray were all obtained that day and were all negative.  He was  seen back in the office on February 09, 2003, with no improvement.  CT of the  chest, abdomen, and pelvis was done later that day showing patchy air space  process, right upper lung, question acute infiltrate;  nodular densities in  right lung;  lesions were noted within the sternum, manubrium, T3,  T5, T6,  T12, and L5, consistent with myelomatous involvement.  The patient was seen  in the office again today with his condition unchanged.  He continues to  have fevers up to 102 degrees, a persistent dry cough, chills, and malaise.  We elected to admit him for further evaluation, IV antibiotics.   PAST MEDICAL HISTORY:  1. IgG multiple myeloma as above.  2. Hypertension.  3. Hypercholesterolemia.   MEDICATIONS:  1. Norvasc 10 mg daily.  2. Lovastatin 40 mg two tablets at bedtime  3. Lisinopril 10/12.5 daily.  4. Advil p.r.n.  5. Tylenol p.r.n.   ALLERGIES:  No known drug allergies.   FAMILY HISTORY:  Negative for malignancy.   SOCIAL HISTORY:  Mr. Heid lives  in [place].  He is married.  He has two  daughters.  No history of ETOH or tobacco use.   REVIEW OF SYSTEMS:  Recent five pound weight loss.  Appetite has been poor.  Energy has been poor.  He has had fevers/chills for more then seven days.  He had a headache earlier today.  He denies any vision changes.  He has had  no shortness of breath.  He has had a persistent dry cough.  He denies any  chest pain or peripheral edema.  He has had no recent change in his bowel  habits.  He denies any hematuria or dysuria.   PHYSICAL EXAMINATION:  VITAL SIGNS:  Temperature 99.8, heart rate 109,  respirations 20, blood pressure 157/87, weight 223 pounds.  GENERAL:  A well-nourished Caucasian male in no acute distress.  HEENT:  Normocephalic, atraumatic.  Pupils equal, round, reactive to light;  extraocular movements are intact;  sclerae anicteric.  Oropharynx with ulcer  right buccal mucosa.  LYMPHATICS:  No palpable cervical, supraclavicular, or axillary lymph nodes.  CHEST:  Lungs are clear bilaterally; no respiratory distress.  CARDIOVASCULAR:  Regular rate and rhythm.  ABDOMEN:  Soft and nontender.  No organomegaly.  Bowel sounds are active.  EXTREMITIES:  No cyanosis, clubbing, or edema.  NEUROLOGIC:  Alert and oriented.   Motor strength is 5/5.  Gait is normal.  SKIN:  Follicular-appearing rash over low back which is improving.   LABORATORY DATA:  Pending.   IMPRESSION AND PLAN:  1. Pneumonia.  His fever has persisted despite Augmentin and Cipro.     Question atypical pneumonia versus PCP versus fungus.  We will admit him     for IV antibiotics, obtain sputum Gram stain/culture/PCP stain, and ask     pulmonary to consider bronchoscopy.  2. IgG kappa multiple myeloma, status post three cycles of DVD with     improvement in IgG level and beta-2 microglobulin.  He is scheduled to be     seen at [facility] on February 16, 2003, for consideration of possible     transplant.  3. Hypertension.  4. Hypercholesterolemia.   The patient was seen and examined by Dr. Truett Perna.      Lonna Cobb, N.P.                         Leighton Roach. Truett Perna, M.D.    LT/MEDQ  D:  02/10/2003  T:  02/10/2003  Job:  [id number]

## 2010-12-06 ENCOUNTER — Encounter: Payer: Self-pay | Admitting: Pulmonary Disease

## 2010-12-14 ENCOUNTER — Other Ambulatory Visit: Payer: Self-pay | Admitting: Oncology

## 2010-12-14 ENCOUNTER — Encounter (HOSPITAL_BASED_OUTPATIENT_CLINIC_OR_DEPARTMENT_OTHER): Payer: BC Managed Care – PPO | Admitting: Oncology

## 2010-12-14 DIAGNOSIS — E876 Hypokalemia: Secondary | ICD-10-CM

## 2010-12-14 DIAGNOSIS — C9000 Multiple myeloma not having achieved remission: Secondary | ICD-10-CM

## 2010-12-14 DIAGNOSIS — R945 Abnormal results of liver function studies: Secondary | ICD-10-CM

## 2010-12-14 LAB — CBC WITH DIFFERENTIAL/PLATELET
BASO%: 0.6 % (ref 0.0–2.0)
EOS%: 2.8 % (ref 0.0–7.0)
HCT: 41.8 % (ref 38.4–49.9)
MCH: 33.2 pg (ref 27.2–33.4)
MCHC: 35.4 g/dL (ref 32.0–36.0)
MONO%: 5.8 % (ref 0.0–14.0)
NEUT%: 64.6 % (ref 39.0–75.0)
RDW: 13.4 % (ref 11.0–14.6)
lymph#: 1.4 10*3/uL (ref 0.9–3.3)

## 2010-12-17 ENCOUNTER — Ambulatory Visit (INDEPENDENT_AMBULATORY_CARE_PROVIDER_SITE_OTHER): Payer: BC Managed Care – PPO | Admitting: Pulmonary Disease

## 2010-12-17 ENCOUNTER — Encounter: Payer: Self-pay | Admitting: Pulmonary Disease

## 2010-12-17 DIAGNOSIS — E663 Overweight: Secondary | ICD-10-CM

## 2010-12-17 DIAGNOSIS — I2510 Atherosclerotic heart disease of native coronary artery without angina pectoris: Secondary | ICD-10-CM

## 2010-12-17 DIAGNOSIS — D126 Benign neoplasm of colon, unspecified: Secondary | ICD-10-CM

## 2010-12-17 DIAGNOSIS — C9000 Multiple myeloma not having achieved remission: Secondary | ICD-10-CM

## 2010-12-17 DIAGNOSIS — Z0000 Encounter for general adult medical examination without abnormal findings: Secondary | ICD-10-CM

## 2010-12-17 DIAGNOSIS — Z125 Encounter for screening for malignant neoplasm of prostate: Secondary | ICD-10-CM

## 2010-12-17 DIAGNOSIS — I10 Essential (primary) hypertension: Secondary | ICD-10-CM

## 2010-12-17 DIAGNOSIS — E7800 Pure hypercholesterolemia, unspecified: Secondary | ICD-10-CM

## 2010-12-17 NOTE — Progress Notes (Signed)
Subjective:    Patient ID: Chad [name], male    DOB: [birth year], 55 y.o.   MRN: [id number]  HPI 55 y/o WM, husb of Laiken Nohr, here for a follow up visit & refill of meds... he is also followed closely by DrSherrill, Oncology w/ hx of Mult Myeloma> see below...  ~  December 17, 2010:  55yr ROV & Francis Dowse feels that he is doing well> followed by DrSherrill for Heme as well as DrShea in [facility] he tells me he remains in remission after his salvage therapy w/ Revlilid & Decadron in 2010... He denies cough, sputum, CP, palpit, SOB/ DOE, edema, etc...  He ran out of his BP meds one month ago & his BP has been 130s/ 80s at home & he wants to try off meds for now;  Similarly he quit his Vytorin about a yr ago & trying "diet alone" but wt up to 233# & he has a long way to go w/ diet & exercise for this prob... F/u FLP w/ incr TG & LDL but he doesn't want meds yet & wants to try better diet & exercise program...  We reviewed recent labs from DrSherrill & we complimented those w/ FASTING labs at this time>> see below...   Problem List:  Hx PNEUMONIA>  He had bouts of Pneumonia 6/09 (outpt) & 6/10 (hosp)> resolved w/ antibiotic Rx & no known sequellae... ~  CXR 2/11 showed bibasilar linear scarring & atx, no pneumonia etc...   HYPERTENSION (ICD-401.9) - prev controlled on HCTZ 25mg /d & K20/d, w/  BP in 2010 = 120/84... He tells me he ran out of his meds about 1 mo ago & BP today= 140/84 & he's been getting 130/80 at home since he ran out... denies HA, fatigue, visual changes, CP, palipit, dizziness, syncope, dyspnea, edema, etc... He wants to try off the med going forward & will monitor his BP carefully...  CORONARY ARTERY DISEASE (ICD-414.00) - on ASA 81mg /d... adm w/ CP in 2003 w/ cath showing non-obstructive CAD- 40% lesion in sm ramus branch off the LAD, 30-40% midCIRC, otherw neg, EF= 65%... med rx w/ ASA 81mg /d + secondary risk factor reduction strategy... ~  NuclearStressTest 3/00 was neg- no ischmia, no infarct,  EF= 69%... ~  6/12:  I reminded pt that secondary risk factor reduction strategy includes BP & Chol prominently!  HYPERCHOLESTEROLEMIA (ICD-272.0) - prev on VYTORIN 10-20 daily, he stopped it on his own some time in 2011 for no particular reason> ~  FLP 7/06 on Vytor10-20 showed TChol 161, TG 66, HDL 44, LDL 104. ~  FLP 3/08 on Vytor10-20 showed TChol 125, TG 66, HDL 46, LDL 66 ~  FLP 4/10 on Vytor10-20 showed TChol 149, TG 79, HDL 64, LDL 69 ~  FLP 6/12 off med for 72yr showed TChol 192, TG 207, HDL 58, LDL 129... He doesn't want to go back on meds, therefore needs much better diet!  OVERWEIGHT (ICD-278.02) - we discussed diet + exercise recommendations... ~  peak weight = 248# in 2007... ~  weight 10/08 = 244# ~  weight 3/10 = 226# ~  Weight 6/12 = 233#... We reviewed diet + exercise program required for wt reduction...  COLONIC POLYPS (ICD-211.3) - last colonoscopy 10/08 by DrDBrodie showed 6mm desc colon polyp & grI hems... path= adenomatous w/ f/u planned for 27yrs...  MULTIPLE  MYELOMA (ICD-203.00) - Diagnosed w/ IgG Myeloma in 2003: he had DVD chemoRx in [place] then referred to [facility] for high dose chemotherapy w/ autologous  stem cell support done 10/04... since then he's been followed regularly at [facility] and by DrSherrill (on Zometa thru 2007)... his monoclonal protein had been slowly rising & restaging bone marrow bx 11/09 showed 10-20% plasma cells... started on Revlimid (Thalidomide)/ weekly Decadron Jan10 for 12 cycles thru Dec10 at which time his SPE/ IEP showed no monoclonal protein... ~  He gets most of his labs at [facility] & DrShea communicates w/ DrSherrill... ~  Met Bone Survey 4/12 showed scattered lucencies that could represent myelomatous changes...  Hx of URTICARIA (ICD-708.9) & Hx of ANGIOEDEMA (ICD-995.1) - he developed angioedema of the lips and hives all over after being given Avelox for pneumonia by an [facility] in [place]... he was also on Diovan at the time and this was felt  to be the more likely culprit... hx of ACE cough in the past & Imipenim rash yrs ago...   No past surgical history on file.  Outpatient Encounter Prescriptions as of 56/10/2010  Medication Sig Dispense Refill  . aspirin 81 MG tablet Take 56 mg by mouth daily.        Marland Kitchen DISCONTD: ezetimibe-simvastatin (VYTORIN) 10-20 MG per tablet Take 1 tablet by mouth at bedtime.    ==> pt stopped on his own 1 yr ago...    . DISCONTD: hydrochlorothiazide 25 MG tablet Take 25 mg by mouth daily.    ==> pt ran out 31mo ago...    . DISCONTD: potassium chloride (KLOR-CON) 20 MEQ packet Take 20 mEq by mouth daily.          Allergies  Allergen Reactions  . Lisinopril     REACTION: ALLERGIC to ACE w/ cough...  . Valsartan     REACTION: ALLERGIC to ARB w/ Urticaria \\T \ Angioedema     Review of Systems       See HPI - all other systems neg except as noted... The patient denies anorexia, fever, weight loss, weight gain, vision loss, decreased hearing, hoarseness, chest pain, syncope, dyspnea on exertion, peripheral edema, prolonged cough, headaches, hemoptysis, abdominal pain, melena, hematochezia, severe indigestion/heartburn, hematuria, incontinence, muscle weakness, suspicious skin lesions, transient blindness, difficulty walking, depression, unusual weight change, abnormal bleeding, enlarged lymph nodes, and angioedema.    Objective:   Physical Exam     WD, Overweight, 55 y/o WM in NAD... GENERAL:  Alert & oriented; pleasant & cooperative. HEENT:  [place]/AT, EOM-wnl, PERRLA, Fundi-benign, EACs-clear, TMs-wnl, NOSE-clear, THROAT-clear & wnl. NECK:  Supple w/ fairROM; no JVD; normal carotid impulses w/o bruits; no thyromegaly or nodules palpated; no lymphadenopathy. CHEST:  Clear to P & A; without wheezes/ rales/ or rhonchi. HEART:  Regular Rhythm; without murmurs/ rubs/ or gallops. ABDOMEN:  Soft & nontender; normal bowel sounds; no organomegaly or masses detected. EXT: without deformities or arthritic changes;  no varicose veins/ venous insuffic/ or edema. NEURO:  CN's intact; motor testing normal; sensory testing normal; gait normal & balance OK. DERM:  No lesions noted; no rash etc...   Assessment & Plan:   HBP>  He does not want to restart BP meds at this time; I reminded him of the coronary risk factors of HBP & Chol...  HYPERCHOL>  He stopped Vytorin on his own about 1 yr ago & current lipid profile is not so good w/ elev TG & LDL.;  He wants diet + exercise for ~17mo & recheck...  OVERWT>  Everything else is dependent upon wt reduction & we discussed this...  Screening>  Up to date on colonoscopy & his PSA is wnl (  0.58)...  MYELOMA>  follwed by DrSherrill & DSrShea; we reviewed DrSherrills last note to me 11/11...  Other medical problems as noted.Marland KitchenMarland Kitchen

## 2010-12-17 NOTE — Patient Instructions (Signed)
Today we updated your med list in EPIC...    We decided to leave the HCTZ/ KCl off for now & monitor your BP at home; goal is to keep your BP <150/90, associated w/ a low sodium (no salt), weight reducing diet!!!  Please return to our lab one morning this week for your fasting blood work...    Then call the PHONE TREE in a few days for your results...    Dial [id number] & when prompted enter your patient number followed by the # symbol...    Your patient number is:  [id number]  Weight reduction is key to controlling your BP without meds!  If your Cholesterol readings are significantly elevated- we will make a recommendation for a new Cholesterol med...  Call for any questions...  Let's plan a routine follow up in 1 yr, sooner if needed for problems.Marland KitchenMarland Kitchen

## 2010-12-18 ENCOUNTER — Other Ambulatory Visit (INDEPENDENT_AMBULATORY_CARE_PROVIDER_SITE_OTHER): Payer: BC Managed Care – PPO

## 2010-12-18 ENCOUNTER — Other Ambulatory Visit: Payer: Self-pay | Admitting: Pulmonary Disease

## 2010-12-18 DIAGNOSIS — Z125 Encounter for screening for malignant neoplasm of prostate: Secondary | ICD-10-CM

## 2010-12-18 DIAGNOSIS — Z0000 Encounter for general adult medical examination without abnormal findings: Secondary | ICD-10-CM

## 2010-12-18 LAB — LIPID PANEL: Triglycerides: 207 mg/dL — ABNORMAL HIGH (ref 0.0–149.0)

## 2010-12-18 LAB — LDL CHOLESTEROL, DIRECT: Direct LDL: 128.8 mg/dL

## 2010-12-18 LAB — PSA: PSA: 0.58 ng/mL (ref 0.10–4.00)

## 2010-12-18 LAB — TSH: TSH: 1.53 u[IU]/mL (ref 0.35–5.50)

## 2010-12-19 LAB — SPEP & IFE WITH QIG
Alpha-2-Globulin: 12.1 % — ABNORMAL HIGH (ref 7.1–11.8)
Gamma Globulin: 5.9 % — ABNORMAL LOW (ref 11.1–18.8)
IgA: 131 mg/dL (ref 68–379)
IgG (Immunoglobin G), Serum: 742 mg/dL (ref 650–1600)
Total Protein, Serum Electrophoresis: 6.6 g/dL (ref 6.0–8.3)

## 2010-12-19 LAB — BASIC METABOLIC PANEL
CO2: 20 mEq/L (ref 19–32)
Chloride: 107 mEq/L (ref 96–112)
Glucose, Bld: 106 mg/dL — ABNORMAL HIGH (ref 70–99)
Potassium: 4.1 mEq/L (ref 3.5–5.3)
Sodium: 140 mEq/L (ref 135–145)

## 2010-12-19 LAB — KAPPA/LAMBDA LIGHT CHAINS: Kappa:Lambda Ratio: 0.91 (ref 0.26–1.65)

## 2010-12-24 ENCOUNTER — Encounter: Payer: Self-pay | Admitting: Pulmonary Disease

## 2011-03-22 ENCOUNTER — Other Ambulatory Visit: Payer: Self-pay | Admitting: Oncology

## 2011-03-22 ENCOUNTER — Encounter (HOSPITAL_BASED_OUTPATIENT_CLINIC_OR_DEPARTMENT_OTHER): Payer: BC Managed Care – PPO | Admitting: Oncology

## 2011-03-22 DIAGNOSIS — E876 Hypokalemia: Secondary | ICD-10-CM

## 2011-03-22 DIAGNOSIS — R945 Abnormal results of liver function studies: Secondary | ICD-10-CM

## 2011-03-22 DIAGNOSIS — C9000 Multiple myeloma not having achieved remission: Secondary | ICD-10-CM

## 2011-03-22 LAB — CBC WITH DIFFERENTIAL/PLATELET
Basophils Absolute: 0 10*3/uL (ref 0.0–0.1)
EOS%: 2.7 % (ref 0.0–7.0)
HGB: 14.7 g/dL (ref 13.0–17.1)
LYMPH%: 28.3 % (ref 14.0–49.0)
MCH: 31.7 pg (ref 27.2–33.4)
MCV: 89.7 fL (ref 79.3–98.0)
MONO%: 6 % (ref 0.0–14.0)
NEUT%: 62.4 % (ref 39.0–75.0)
Platelets: 176 10*3/uL (ref 140–400)
RDW: 13.4 % (ref 11.0–14.6)

## 2011-03-26 LAB — COMPREHENSIVE METABOLIC PANEL WITH GFR
ALT: 32 U/L (ref 0–53)
AST: 21 U/L (ref 0–37)
Albumin: 4.5 g/dL (ref 3.5–5.2)
Alkaline Phosphatase: 41 U/L (ref 39–117)
BUN: 13 mg/dL (ref 6–23)
CO2: 22 meq/L (ref 19–32)
Calcium: 8.7 mg/dL (ref 8.4–10.5)
Chloride: 111 meq/L (ref 96–112)
Creatinine, Ser: 0.97 mg/dL (ref 0.50–1.35)
Glucose, Bld: 107 mg/dL — ABNORMAL HIGH (ref 70–99)
Potassium: 4 meq/L (ref 3.5–5.3)
Sodium: 134 meq/L — ABNORMAL LOW (ref 135–145)
Total Bilirubin: 0.5 mg/dL (ref 0.3–1.2)
Total Protein: 7 g/dL (ref 6.0–8.3)

## 2011-03-26 LAB — SPEP & IFE WITH QIG
Albumin ELP: 61.8 % (ref 55.8–66.1)
Alpha-1-Globulin: 4 % (ref 2.9–4.9)
Alpha-2-Globulin: 12 % — ABNORMAL HIGH (ref 7.1–11.8)
Beta 2: 10.6 % — ABNORMAL HIGH (ref 3.2–6.5)
Beta Globulin: 6.3 % (ref 4.7–7.2)
Gamma Globulin: 5.3 % — ABNORMAL LOW (ref 11.1–18.8)
IgA: 114 mg/dL (ref 68–379)
IgG (Immunoglobin G), Serum: 851 mg/dL (ref 650–1600)
IgM, Serum: 57 mg/dL (ref 41–251)
M-Spike, %: 0.34 g/dL
Total Protein, Serum Electrophoresis: 7 g/dL (ref 6.0–8.3)

## 2011-03-26 LAB — KAPPA/LAMBDA LIGHT CHAINS
Kappa:Lambda Ratio: 3.09 — ABNORMAL HIGH (ref 0.26–1.65)
Lambda Free Lght Chn: 1.13 mg/dL (ref 0.57–2.63)

## 2011-06-21 ENCOUNTER — Other Ambulatory Visit: Payer: Self-pay | Admitting: Oncology

## 2011-06-21 ENCOUNTER — Other Ambulatory Visit (HOSPITAL_BASED_OUTPATIENT_CLINIC_OR_DEPARTMENT_OTHER): Payer: BC Managed Care – PPO | Admitting: Lab

## 2011-06-21 DIAGNOSIS — E876 Hypokalemia: Secondary | ICD-10-CM

## 2011-06-21 DIAGNOSIS — R945 Abnormal results of liver function studies: Secondary | ICD-10-CM

## 2011-06-21 DIAGNOSIS — C9000 Multiple myeloma not having achieved remission: Secondary | ICD-10-CM

## 2011-06-21 LAB — CBC WITH DIFFERENTIAL/PLATELET
BASO%: 0.6 % (ref 0.0–2.0)
EOS%: 4.1 % (ref 0.0–7.0)
LYMPH%: 24.8 % (ref 14.0–49.0)
MCH: 32 pg (ref 27.2–33.4)
MCHC: 35 g/dL (ref 32.0–36.0)
MONO#: 0.3 10*3/uL (ref 0.1–0.9)
NEUT%: 64.5 % (ref 39.0–75.0)
RBC: 4.63 10*6/uL (ref 4.20–5.82)
WBC: 4.8 10*3/uL (ref 4.0–10.3)
lymph#: 1.2 10*3/uL (ref 0.9–3.3)

## 2011-06-25 LAB — SPEP & IFE WITH QIG
Albumin ELP: 61.3 % (ref 55.8–66.1)
Alpha-1-Globulin: 3.9 % (ref 2.9–4.9)
Alpha-2-Globulin: 11.3 % (ref 7.1–11.8)
Beta Globulin: 5.7 % (ref 4.7–7.2)
IgG (Immunoglobin G), Serum: 1120 mg/dL (ref 650–1600)
M-Spike, %: 0.52 g/dL
Total Protein, Serum Electrophoresis: 7 g/dL (ref 6.0–8.3)

## 2011-06-25 LAB — COMPREHENSIVE METABOLIC PANEL
ALT: 29 U/L (ref 0–53)
Albumin: 4.5 g/dL (ref 3.5–5.2)
CO2: 25 mEq/L (ref 19–32)
Calcium: 9.4 mg/dL (ref 8.4–10.5)
Chloride: 105 mEq/L (ref 96–112)
Creatinine, Ser: 0.98 mg/dL (ref 0.50–1.35)
Potassium: 4.5 mEq/L (ref 3.5–5.3)

## 2011-06-25 LAB — KAPPA/LAMBDA LIGHT CHAINS: Kappa free light chain: 9.5 mg/dL — ABNORMAL HIGH (ref 0.33–1.94)

## 2011-06-28 ENCOUNTER — Other Ambulatory Visit: Payer: Self-pay | Admitting: Oncology

## 2011-07-10 ENCOUNTER — Telehealth: Payer: Self-pay | Admitting: *Deleted

## 2011-07-10 NOTE — Telephone Encounter (Signed)
MD had called him to discuss lab results. He requests results of 06/21/11 be faxed to his home fax at [phone]. (Will forward to HIM) Also asking MD if he needs to have labs checked before March 2013?

## 2011-07-11 ENCOUNTER — Telehealth: Payer: Self-pay | Admitting: *Deleted

## 2011-07-11 ENCOUNTER — Other Ambulatory Visit: Payer: Self-pay | Admitting: *Deleted

## 2011-07-11 DIAGNOSIS — C9000 Multiple myeloma not having achieved remission: Secondary | ICD-10-CM

## 2011-07-11 NOTE — Telephone Encounter (Signed)
Called pt, left message for lab draw on 08/02/11

## 2011-07-11 NOTE — Telephone Encounter (Signed)
VM left on pt's cell phone with identified voice of Damond Borchers stating that Dr. Truett Perna will repeat labs in 6 wks from 06/21/11 & HIM will fax lab report to his home fax.

## 2011-07-31 ENCOUNTER — Other Ambulatory Visit (HOSPITAL_COMMUNITY): Payer: BC Managed Care – PPO

## 2011-08-02 ENCOUNTER — Other Ambulatory Visit (HOSPITAL_BASED_OUTPATIENT_CLINIC_OR_DEPARTMENT_OTHER): Payer: BC Managed Care – PPO | Admitting: Lab

## 2011-08-02 DIAGNOSIS — C9000 Multiple myeloma not having achieved remission: Secondary | ICD-10-CM

## 2011-08-06 LAB — SPEP & IFE WITH QIG
Albumin ELP: 57.7 % (ref 55.8–66.1)
Alpha-1-Globulin: 4 % (ref 2.9–4.9)
Alpha-2-Globulin: 11.3 % (ref 7.1–11.8)
Beta Globulin: 5.9 % (ref 4.7–7.2)
M-Spike, %: 0.88 g/dL
Total Protein, Serum Electrophoresis: 7 g/dL (ref 6.0–8.3)

## 2011-08-06 LAB — COMPREHENSIVE METABOLIC PANEL
ALT: 25 U/L (ref 0–53)
AST: 20 U/L (ref 0–37)
Albumin: 4.6 g/dL (ref 3.5–5.2)
CO2: 18 mEq/L — ABNORMAL LOW (ref 19–32)
Calcium: 9.1 mg/dL (ref 8.4–10.5)
Chloride: 103 mEq/L (ref 96–112)
Creatinine, Ser: 1.38 mg/dL — ABNORMAL HIGH (ref 0.50–1.35)
Potassium: 4.1 mEq/L (ref 3.5–5.3)
Total Protein: 7 g/dL (ref 6.0–8.3)

## 2011-08-06 LAB — KAPPA/LAMBDA LIGHT CHAINS: Kappa free light chain: 14.8 mg/dL — ABNORMAL HIGH (ref 0.33–1.94)

## 2011-08-08 ENCOUNTER — Telehealth: Payer: Self-pay | Admitting: *Deleted

## 2011-08-08 ENCOUNTER — Other Ambulatory Visit: Payer: Self-pay | Admitting: *Deleted

## 2011-08-08 NOTE — Telephone Encounter (Signed)
Left message on voicemail for pt to call office. Light chains and MSpike are higher. Dr. Truett Perna wants to see pt in office on 08/09/11 at 1530. Order sent to schedulers for appt.

## 2011-08-09 ENCOUNTER — Ambulatory Visit: Payer: BC Managed Care – PPO | Admitting: Oncology

## 2011-08-10 ENCOUNTER — Telehealth: Payer: Self-pay | Admitting: Oncology

## 2011-08-10 NOTE — Telephone Encounter (Signed)
Talked to pt, gave him appt date for March 18th 2013

## 2011-08-14 ENCOUNTER — Other Ambulatory Visit: Payer: Self-pay | Admitting: *Deleted

## 2011-08-14 DIAGNOSIS — C9000 Multiple myeloma not having achieved remission: Secondary | ICD-10-CM

## 2011-08-15 ENCOUNTER — Telehealth: Payer: Self-pay | Admitting: Oncology

## 2011-08-15 NOTE — Telephone Encounter (Signed)
called pt and scheduled lab appt for 2-18

## 2011-09-02 ENCOUNTER — Other Ambulatory Visit: Payer: BC Managed Care – PPO | Admitting: Lab

## 2011-09-02 ENCOUNTER — Other Ambulatory Visit (HOSPITAL_BASED_OUTPATIENT_CLINIC_OR_DEPARTMENT_OTHER): Payer: BC Managed Care – PPO | Admitting: Lab

## 2011-09-02 DIAGNOSIS — C9000 Multiple myeloma not having achieved remission: Secondary | ICD-10-CM

## 2011-09-02 LAB — CBC WITH DIFFERENTIAL/PLATELET
Basophils Absolute: 0 10*3/uL (ref 0.0–0.1)
Eosinophils Absolute: 0.2 10*3/uL (ref 0.0–0.5)
HCT: 42.3 % (ref 38.4–49.9)
HGB: 14.8 g/dL (ref 13.0–17.1)
LYMPH%: 29.5 % (ref 14.0–49.0)
MCV: 93.6 fL (ref 79.3–98.0)
MONO#: 0.3 10*3/uL (ref 0.1–0.9)
NEUT#: 3.2 10*3/uL (ref 1.5–6.5)
Platelets: 206 10*3/uL (ref 140–400)
RBC: 4.51 10*6/uL (ref 4.20–5.82)
WBC: 5.2 10*3/uL (ref 4.0–10.3)

## 2011-09-04 LAB — PROTEIN ELECTROPHORESIS, SERUM
Albumin ELP: 55.2 % — ABNORMAL LOW (ref 55.8–66.1)
Beta 2: 21 % — ABNORMAL HIGH (ref 3.2–6.5)
Gamma Globulin: 3.7 % — ABNORMAL LOW (ref 11.1–18.8)

## 2011-09-04 LAB — BASIC METABOLIC PANEL
BUN: 12 mg/dL (ref 6–23)
Chloride: 102 mEq/L (ref 96–112)
Creatinine, Ser: 0.96 mg/dL (ref 0.50–1.35)

## 2011-09-04 LAB — KAPPA/LAMBDA LIGHT CHAINS: Kappa:Lambda Ratio: 28.09 — ABNORMAL HIGH (ref 0.26–1.65)

## 2011-09-10 ENCOUNTER — Telehealth: Payer: Self-pay | Admitting: *Deleted

## 2011-09-10 NOTE — Telephone Encounter (Signed)
Faxed MD message and copy of labs to patient home fax as he requested.

## 2011-09-10 NOTE — Telephone Encounter (Signed)
Message copied by Wandalee Ferdinand on Tue Sep 10, 2011  6:56 PM ------      Message from: Thornton Papas B      Created: Tue Sep 10, 2011  9:10 AM       Please call patient, m spike and light chains are slowly rising.  Creatinine is now normal.            Dr. Lance Bosch recommends starting revlimid. We will discuss at next weeks visit and start revlimid.

## 2011-09-12 ENCOUNTER — Telehealth: Payer: Self-pay | Admitting: Oncology

## 2011-09-12 NOTE — Telephone Encounter (Signed)
appt for 3/18 moved from 3/18 to 3/8 per BS - order given on 2/26 pof for ZOX#[id number]. lmonvm for pt re change and asking that 3/12 cxr be done prior to seeing BS 3/8. cxr can be done mon-fri 7 am thru 5 pm. Schedule mailed.

## 2011-09-16 ENCOUNTER — Telehealth: Payer: Self-pay | Admitting: Oncology

## 2011-09-16 ENCOUNTER — Other Ambulatory Visit: Payer: Self-pay | Admitting: *Deleted

## 2011-09-16 NOTE — Telephone Encounter (Signed)
called pt lmovm to rtn call to schedule lab before 03/08

## 2011-09-17 ENCOUNTER — Telehealth: Payer: Self-pay | Admitting: Oncology

## 2011-09-17 ENCOUNTER — Other Ambulatory Visit (HOSPITAL_BASED_OUTPATIENT_CLINIC_OR_DEPARTMENT_OTHER): Payer: BC Managed Care – PPO

## 2011-09-17 DIAGNOSIS — E876 Hypokalemia: Secondary | ICD-10-CM

## 2011-09-17 DIAGNOSIS — R945 Abnormal results of liver function studies: Secondary | ICD-10-CM

## 2011-09-17 DIAGNOSIS — C9000 Multiple myeloma not having achieved remission: Secondary | ICD-10-CM

## 2011-09-17 LAB — CBC WITH DIFFERENTIAL/PLATELET
BASO%: 0.7 % (ref 0.0–2.0)
EOS%: 3.1 % (ref 0.0–7.0)
HCT: 41 % (ref 38.4–49.9)
LYMPH%: 32.1 % (ref 14.0–49.0)
MCH: 32.3 pg (ref 27.2–33.4)
MCHC: 34.6 g/dL (ref 32.0–36.0)
MCV: 93.4 fL (ref 79.3–98.0)
MONO%: 4.9 % (ref 0.0–14.0)
NEUT%: 59.2 % (ref 39.0–75.0)
Platelets: 208 10*3/uL (ref 140–400)

## 2011-09-17 LAB — COMPREHENSIVE METABOLIC PANEL
ALT: 44 U/L (ref 0–53)
AST: 30 U/L (ref 0–37)
Creatinine, Ser: 0.95 mg/dL (ref 0.50–1.35)
Total Bilirubin: 0.4 mg/dL (ref 0.3–1.2)

## 2011-09-17 NOTE — Telephone Encounter (Signed)
called pt and scheduled lab appt for 03/05 and bone survey for 03/06 @ [facility]

## 2011-09-18 ENCOUNTER — Telehealth: Payer: Self-pay | Admitting: *Deleted

## 2011-09-18 ENCOUNTER — Ambulatory Visit (HOSPITAL_COMMUNITY)
Admission: RE | Admit: 2011-09-18 | Discharge: 2011-09-18 | Disposition: A | Discharge status: Home / Self Care | Payer: BC Managed Care – PPO | Source: Ambulatory Visit | Attending: Oncology | Admitting: Oncology

## 2011-09-18 DIAGNOSIS — M949 Disorder of cartilage, unspecified: Secondary | ICD-10-CM | POA: Insufficient documentation

## 2011-09-18 DIAGNOSIS — M899 Disorder of bone, unspecified: Secondary | ICD-10-CM | POA: Insufficient documentation

## 2011-09-18 NOTE — Telephone Encounter (Signed)
Patient called for bone survey results--having some pain in right arm and wonders if related to myeloma or muscular. Made him aware that survey was unchanged from April. CBC is normal and Cmet OK. Myeloma tests are still pending. Follow up as scheduled on 3/8 for discussion of treatment plan with MD. Script for Revlimid on MD desk to be signed.

## 2011-09-19 LAB — SPEP & IFE WITH QIG
Alpha-1-Globulin: 4 % (ref 2.9–4.9)
Alpha-2-Globulin: 11.4 % (ref 7.1–11.8)
IgM, Serum: 56 mg/dL (ref 41–251)
M-Spike, %: 1.42 g/dL
Total Protein, Serum Electrophoresis: 8 g/dL (ref 6.0–8.3)

## 2011-09-19 LAB — KAPPA/LAMBDA LIGHT CHAINS
Kappa free light chain: 27.2 mg/dL — ABNORMAL HIGH (ref 0.33–1.94)
Lambda Free Lght Chn: 0.65 mg/dL (ref 0.57–2.63)

## 2011-09-20 ENCOUNTER — Other Ambulatory Visit: Payer: BC Managed Care – PPO

## 2011-09-20 ENCOUNTER — Ambulatory Visit (HOSPITAL_BASED_OUTPATIENT_CLINIC_OR_DEPARTMENT_OTHER): Payer: BC Managed Care – PPO | Admitting: Oncology

## 2011-09-20 VITALS — BP 148/89 | HR 89 | Temp 97.3°F | Ht 72.0 in | Wt 236.9 lb

## 2011-09-20 DIAGNOSIS — M79609 Pain in unspecified limb: Secondary | ICD-10-CM

## 2011-09-20 DIAGNOSIS — C9000 Multiple myeloma not having achieved remission: Secondary | ICD-10-CM

## 2011-09-20 NOTE — Progress Notes (Signed)
OFFICE PROGRESS NOTE   INTERVAL HISTORY:   He returns as scheduled. The serum M spike and serum free light chains have increased over the past few months. He feels well. There has been pain at the right shoulder and upper arm for the past several weeks. A metastatic bone survey on 09/18/2011 was unchanged compared to a study from 10/19/2010. Persistent lytic lesions were noted in the skull, ribs, vertebral bodies, and extremities. He reports no trauma to the right arm. The pain has improved over the past week. There's been no recent infections.  Objective:  Vital signs in last 24 hours:  Blood pressure 148/89, pulse 89, temperature 97.3 F (36.3 C), temperature source Oral, height 6' (1.829 m), weight 236 lb 14.4 oz (107.457 kg).    Lymphatics: No cervical, supraclavicular, or axillary nodes Resp: Lungs clear bilaterally Cardio: Regular rate and rhythm GI: No hepatosplenomegaly Vascular: No leg edema  Musculoskeletal: Full range of motion of the right shoulder without pain. No tenderness at the right clavicle, humerus, or neck. No mass.  Lab Results:  Lab Results  Component Value Date   WBC 5.1 09/17/2011   HGB 14.2 09/17/2011   HCT 41.0 09/17/2011   MCV 93.4 09/17/2011   PLT 208 09/17/2011   serum M spike 1.42 on 09/17/2011 Serum free kappa light chains 27.2 on 09/17/2011 Potassium 3.4, creatinine 0.95 09/17/2011  Medications: I have reviewed the patient's current medications.  Assessment/Plan:  1. Multiple myeloma-status post high-dose chemotherapy with autologous stem cell support in November of 2004.  The sero monoclonal protein was slowly rising and a restaging bone marrow biopsy in November 2009 confirmed 10 to 20% plasma cells.  The serum light chains were elevated 07/25/2008. He completed 12 cycles of salvage therapy with Revlimid/Decadron with the last cycle initiated 06/16/2009.  On 07/13/2009 the serum free light chains were normal and a serum protein  electrophoresis/immunofixation revealed no monoclonal protein.                  -elevated serum M spike and serum free kappa light chain March 2013  2. A serum immunofixation confirmed a monoclonal protein and the serum Kappa and Lambda light chains were mildly elevated in June of 2012.  3. History of elevated liver enzymes. 4. Right lung pneumonia June, 2009 -- resolved after treatment with Bactrim and azithromycin. 5. 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  6. History of hypokalemia -he is no longer taking a potassium supplement 7. Pneumonia 01/06/2009 -- requiring hospitalization 01/10/2011 -- status post broad spectrum antibiotic therapy with clinical resolution. 8. Upper respiratory infection in February, 2011 -- likely a viral URI.  9.     right shoulder discomfort-likely related to benign musculoskeletal condition versus multiple myeloma. He will contact us for persistent discomfort and we will arrange for a CT of the humerus/shoulder region  Disposition:  He appears to have progression of the multiple myeloma with a rising serum M spike and serum free kappa light chains. I discussed treatment options with Chad [name] and his wife. I communicated with Dr.Shea. He recommends single agent Revlimid. Chad [name] has responded to Revlimid-based therapy in the past. The plan is to begin Revlimid on a 3 week on/one-week off schedule 09/27/2011. He'll return for a nadir CBC on 10/22/2011. The plan is to repeat a serum protein electrophoresis and serum free kappa light chain analysis on 11/19/2011. He will return for an office visit on 11/22/2011 prior  to cycle 3.  He will contact us in the interim for persistent or increased pain at the right arm.   Lucile Shutters, MD  09/20/2011  6:18 PM

## 2011-09-23 ENCOUNTER — Telehealth: Payer: Self-pay | Admitting: Oncology

## 2011-09-23 NOTE — Telephone Encounter (Signed)
Called pt lmovm for appts on april-may2013

## 2011-09-24 ENCOUNTER — Inpatient Hospital Stay (HOSPITAL_COMMUNITY): Admission: RE | Admit: 2011-09-24 | Payer: BC Managed Care – PPO | Source: Ambulatory Visit

## 2011-09-30 ENCOUNTER — Ambulatory Visit: Payer: BC Managed Care – PPO | Admitting: Oncology

## 2011-09-30 ENCOUNTER — Other Ambulatory Visit: Payer: BC Managed Care – PPO

## 2011-10-01 ENCOUNTER — Telehealth: Payer: Self-pay | Admitting: Oncology

## 2011-10-01 NOTE — Telephone Encounter (Signed)
rtn call back to pt and provided appts for april-may2013 on vm

## 2011-10-15 ENCOUNTER — Telehealth: Payer: Self-pay | Admitting: *Deleted

## 2011-10-15 NOTE — Telephone Encounter (Signed)
Wants MD aware that he is still having pain in his right shoulder and upper arm. Not better. Asking what is next step?

## 2011-10-16 ENCOUNTER — Other Ambulatory Visit: Payer: Self-pay | Admitting: *Deleted

## 2011-10-16 ENCOUNTER — Telehealth: Payer: Self-pay | Admitting: *Deleted

## 2011-10-16 ENCOUNTER — Ambulatory Visit (HOSPITAL_COMMUNITY)
Admission: RE | Admit: 2011-10-16 | Discharge: 2011-10-16 | Disposition: A | Discharge status: Home / Self Care | Payer: BC Managed Care – PPO | Source: Ambulatory Visit | Attending: Oncology | Admitting: Oncology

## 2011-10-16 DIAGNOSIS — C9000 Multiple myeloma not having achieved remission: Secondary | ICD-10-CM

## 2011-10-16 DIAGNOSIS — M948X9 Other specified disorders of cartilage, unspecified sites: Secondary | ICD-10-CM | POA: Insufficient documentation

## 2011-10-16 DIAGNOSIS — M79609 Pain in unspecified limb: Secondary | ICD-10-CM | POA: Insufficient documentation

## 2011-10-16 NOTE — Telephone Encounter (Signed)
Per Dr. Truett Perna : xray right shoulder and humerous. Orders entered and wife notified that patient can go to radiology dept. Anytime this afternoon for xray. Try Tylenol 650 mg over night for pain. Will regroup tomorrow.

## 2011-10-17 ENCOUNTER — Telehealth: Payer: Self-pay | Admitting: *Deleted

## 2011-10-17 MED ORDER — LENALIDOMIDE 25 MG PO CAPS: 25.0000 mg | ORAL_CAPSULE | Freq: Every day | ORAL | Status: DC

## 2011-10-17 NOTE — Telephone Encounter (Signed)
Unable to reach patient at all contact #'s. Left VM at home to call office for results.

## 2011-10-21 ENCOUNTER — Telehealth: Payer: Self-pay | Admitting: *Deleted

## 2011-10-21 NOTE — Telephone Encounter (Signed)
Left VM for patient to call with more specific information on his pain compared to last week. Informed him to go to ER if his chest pain felt like it could be cardiac related.

## 2011-10-21 NOTE — Telephone Encounter (Signed)
Pain in right shoulder and chest. Wants him seen today.

## 2011-10-21 NOTE — Telephone Encounter (Signed)
Called wife back to offer appointment at 0945 tomorrow--he will come.

## 2011-10-22 ENCOUNTER — Other Ambulatory Visit (HOSPITAL_BASED_OUTPATIENT_CLINIC_OR_DEPARTMENT_OTHER): Payer: BC Managed Care – PPO

## 2011-10-22 ENCOUNTER — Telehealth: Payer: Self-pay | Admitting: Oncology

## 2011-10-22 ENCOUNTER — Ambulatory Visit (HOSPITAL_BASED_OUTPATIENT_CLINIC_OR_DEPARTMENT_OTHER): Payer: BC Managed Care – PPO | Admitting: Nurse Practitioner

## 2011-10-22 VITALS — BP 143/88 | HR 70 | Temp 98.9°F | Ht 72.0 in | Wt 228.3 lb

## 2011-10-22 DIAGNOSIS — C9000 Multiple myeloma not having achieved remission: Secondary | ICD-10-CM

## 2011-10-22 DIAGNOSIS — G629 Polyneuropathy, unspecified: Secondary | ICD-10-CM

## 2011-10-22 DIAGNOSIS — M25519 Pain in unspecified shoulder: Secondary | ICD-10-CM

## 2011-10-22 HISTORY — DX: Polyneuropathy, unspecified: G62.9

## 2011-10-22 LAB — CBC WITH DIFFERENTIAL/PLATELET
Basophils Absolute: 0.1 10*3/uL (ref 0.0–0.1)
Eosinophils Absolute: 0.2 10*3/uL (ref 0.0–0.5)
HGB: 13.2 g/dL (ref 13.0–17.1)
MCV: 92 fL (ref 79.3–98.0)
MONO#: 0.2 10*3/uL (ref 0.1–0.9)
MONO%: 7.9 % (ref 0.0–14.0)
NEUT#: 1.5 10*3/uL (ref 1.5–6.5)
Platelets: 252 10*3/uL (ref 140–400)
RDW: 13.5 % (ref 11.0–14.6)
WBC: 3.1 10*3/uL — ABNORMAL LOW (ref 4.0–10.3)
nRBC: 0 % (ref 0–0)

## 2011-10-22 LAB — COMPREHENSIVE METABOLIC PANEL
ALT: 31 U/L (ref 0–53)
Albumin: 4.2 g/dL (ref 3.5–5.2)
CO2: 21 mEq/L (ref 19–32)
Calcium: 8.9 mg/dL (ref 8.4–10.5)
Chloride: 107 mEq/L (ref 96–112)
Glucose, Bld: 101 mg/dL — ABNORMAL HIGH (ref 70–99)
Sodium: 137 mEq/L (ref 135–145)
Total Bilirubin: 0.5 mg/dL (ref 0.3–1.2)
Total Protein: 7.6 g/dL (ref 6.0–8.3)

## 2011-10-22 NOTE — Telephone Encounter (Signed)
Gv pt appt for [id number]. scheduled pt for MRI on 04/13 @ [facility]

## 2011-10-22 NOTE — Progress Notes (Signed)
OFFICE PROGRESS NOTE  Interval history:  Chad [name] returns prior to a scheduled visit for evaluation of persistent right shoulder/upper arm pain. The pain has been present for the past 6-8 weeks. The pain is located at the right shoulder/upper arm and occurs with movement. He does not have pain at rest. He denies any unusual activities or known injury. He recently began taking Tylenol without relief.  He also reports an approximate 1 week history of bilateral "rib cage" pain. He feels that he likely "strained muscles" with coughing. The rib cage pain is improving. He has a nonproductive cough. He denies fever. No shortness of breath. He has noted fatigue since resuming Revlimid. He denies mouth sores. No skin rash. He is having intermittent leg cramps. He has slight intermittent tingling in the feet.   Objective: Blood pressure 143/88, pulse 70, temperature 98.9 F (37.2 C), temperature source Oral, height 6' (1.829 m), weight 228 lb 4.8 oz (103.556 kg).  Oropharynx is without thrush or ulceration. No palpable axillary adenopathy. Nontender over the right shoulder/upper arm. No obvious mass. Decreased range of motion at the right shoulder due to pain. Lungs are clear. No wheezes or rales. Regular cardiac rhythm. Abdomen is soft and nontender. No hepatomegaly. Extremities are without edema. Calves are soft and nontender.  Lab Results: Lab Results  Component Value Date   WBC 3.1* 10/22/2011   HGB 13.2 10/22/2011   HCT 37.2* 10/22/2011   MCV 92.0 10/22/2011   PLT 252 10/22/2011    Chemistry:    Chemistry      Component Value Date/Time   NA 135 09/17/2011 1457   K 3.4* 09/17/2011 1457   CL 101 09/17/2011 1457   CO2 23 09/17/2011 1457   BUN 9 09/17/2011 1457   CREATININE 0.95 09/17/2011 1457      Component Value Date/Time   CALCIUM 9.4 09/17/2011 1457   ALKPHOS 51 09/17/2011 1457   AST 30 09/17/2011 1457   ALT 44 09/17/2011 1457   BILITOT 0.4 09/17/2011 1457       Studies/Results: Dg Shoulder  Right  10/16/2011  *RADIOLOGY REPORT*  Clinical Data: Myeloma.  Anterior humeral pain for months.  No injury.  RIGHT SHOULDER - 2+ VIEW  Comparison: None.  Findings: Two small lytic lesions proximal humeral shaft suspicious for myeloma involvement.  IMPRESSION: Two small lytic lesions proximal humeral shaft suspicious for myeloma involvement.  Original Report Authenticated By: Fuller Canada, M.D.   Dg Humerus Right  10/16/2011  *RADIOLOGY REPORT*  Clinical Data: History of multiple myeloma.  Anterior humeral pain for months.  No injury.  RIGHT HUMERUS - 2+ VIEW  Comparison: None.  Findings: Tiny area of cortical thinning proximal right humeral shaft possibly representing small lytic lesion related to myeloma.  IMPRESSION: Tiny area of cortical thinning proximal right humeral shaft possibly representing small lytic lesion related to myeloma.  Original Report Authenticated By: Fuller Canada, M.D.    Medications: I have reviewed the patient's current medications.  Assessment/Plan:  1. Multiple myeloma-status post high-dose chemotherapy with autologous stem cell support in November of 2004. The sero monoclonal protein was slowly rising and a restaging bone marrow biopsy in November 2009 confirmed 10 to 20% plasma cells. The serum light chains were elevated 07/25/2008. He completed 12 cycles of salvage therapy with Revlimid/Decadron with the last cycle initiated 06/16/2009. On 07/13/2009 the serum free light chains were normal and a serum protein electrophoresis/immunofixation revealed no monoclonal protein. Elevated serum M spike and serum free kappa light  chains March 2013. He began cycle 1 of Revlimid 25 mg daily x21 days followed by a 7 day break on 09/27/2011. 2. A serum immunofixation confirmed a monoclonal protein and the serum Kappa and Lambda light chains were mildly elevated in June of 2012.  3. History of elevated liver enzymes. 4. Right lung pneumonia June, 2009 -- resolved after treatment  with Bactrim and azithromycin. 5. 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  6. History of hypokalemia -he is no longer taking a potassium supplement 7. Pneumonia 01/06/2009 -- requiring hospitalization 01/10/2011 -- status post broad spectrum antibiotic therapy with clinical resolution. 8. Upper respiratory infection in February, 2011 -- likely a viral URI.  9. Right shoulder/upper arm pain. Plain x-ray of the right shoulder/humerus on 10/16/2011 showed 2 small lytic lesions at the proximal humeral shaft suspicious for myeloma involvement.  Disposition-we are referring Mr. Deshler for an MRI of the right shoulder/proximal humerus to evaluate for myelomatous involvement, rotator cuff injury. We will contact him once the result is available. He will keep his scheduled followup visit on 11/22/2011. He is scheduled to begin cycle 2 Revlimid on 10/25/2011. He will contact the office prior to his next visit with any problems.  Plan reviewed with Dr. Truett Perna.  Lonna Cobb ANP/GNP-BC

## 2011-10-26 ENCOUNTER — Ambulatory Visit (HOSPITAL_COMMUNITY)
Admission: RE | Admit: 2011-10-26 | Discharge: 2011-10-26 | Disposition: A | Discharge status: Home / Self Care | Payer: BC Managed Care – PPO | Source: Ambulatory Visit | Attending: Nurse Practitioner | Admitting: Nurse Practitioner

## 2011-10-26 DIAGNOSIS — C9000 Multiple myeloma not having achieved remission: Secondary | ICD-10-CM

## 2011-10-26 DIAGNOSIS — M25519 Pain in unspecified shoulder: Secondary | ICD-10-CM | POA: Insufficient documentation

## 2011-10-26 MED ORDER — GADOBENATE DIMEGLUMINE 529 MG/ML IV SOLN
20.0000 mL | Freq: Once | INTRAVENOUS | Status: AC | PRN
  Administered 2011-10-26: 20 mL via INTRAVENOUS

## 2011-10-29 ENCOUNTER — Encounter: Payer: Self-pay | Admitting: *Deleted

## 2011-10-30 ENCOUNTER — Encounter: Payer: Self-pay | Admitting: Radiation Oncology

## 2011-10-30 ENCOUNTER — Ambulatory Visit
Admission: RE | Admit: 2011-10-30 | Discharge: 2011-10-30 | Disposition: A | Discharge status: Home / Self Care | Payer: BC Managed Care – PPO | Source: Ambulatory Visit | Attending: Radiation Oncology | Admitting: Radiation Oncology

## 2011-10-30 VITALS — BP 138/86 | HR 88 | Temp 98.5°F | Wt 228.3 lb

## 2011-10-30 DIAGNOSIS — C9000 Multiple myeloma not having achieved remission: Secondary | ICD-10-CM | POA: Insufficient documentation

## 2011-10-30 DIAGNOSIS — Z79899 Other long term (current) drug therapy: Secondary | ICD-10-CM | POA: Insufficient documentation

## 2011-10-30 DIAGNOSIS — Z510 Encounter for antineoplastic radiation therapy: Secondary | ICD-10-CM | POA: Insufficient documentation

## 2011-10-30 HISTORY — DX: Stem cells transplant status: Z94.84

## 2011-10-30 NOTE — Progress Notes (Signed)
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

## 2011-10-30 NOTE — Progress Notes (Signed)
Please see the Nurse Progress Note in the MD Initial Consult Encounter for this patient. 

## 2011-10-30 NOTE — Progress Notes (Signed)
CC:   Ladene Artist, M.D.  REFERRING PHYSICIAN:  Ladene Artist, M.D.  DIAGNOSIS:  Multiple myeloma.  HISTORY OF PRESENT ILLNESS:  Chad [name] is a very pleasant 56 year old gentleman who is seen out the courtesy of Dr. Truett Perna for an opinion concerning radiation therapy as part of management of the patient's progressive multiple myeloma.  Mr. Swett was diagnosed with multiple myeloma back in 2004.  He did undergo high-dose chemotherapy with autologous stem-cell support in November of that year.  The patient did well until 2009 when bone marrow biopsy confirmed 10-20% plasma cells. The patient completed 12 cycles of salvage therapy with Revlimid/Decadron.  More recently, the patient is noted have an elevated serum M spike and serum free kappa light chains in March of this year. The patient proceeded to undergo 1 cycle of Revlimid.  Approximately 8 weeks ago Mr. Weckerly began experiencing some discomfort in his right shoulder.  He attributed this to yard work, but when this problem persisted he was seen by Dr. Kalman Drape associate.  The patient did undergo plain x-rays of the right humerus which showed some very small lytic areas.  In light of the patient's pain and to rule out rotator cuff or other issues causing this problem, the patient proceeded to undergo MRI of the right shoulder.  This was performed on March 13th. The patient was noted to have a large destructive and expansile mass involving the scapula body and base of the coracoid process.  This mass was estimated to be approximately 6.5 x 3.8 x 5.6 cm.  The mass was noted to exert mass effect on the adjacent muscles and possibly invade the supraspinatus muscle.  Given the above findings, the patient is now referred to radiation oncology for consideration for treatment.  ALLERGIES:  The patient has allergies to lisinopril, penicillins and valsartan.  CURRENT MEDICATIONS:  Aspirin 81 mg daily, Extra Strength Tylenol as needed for  pain.  The patient is on Revlimid 25 mg capsule.  The patient takes this once daily for 21 days.  PAST SURGICAL HISTORY:  The patient has no prior surgical history.  PAST MEDICAL HISTORY:  The patient has a history of hypercholesterolemia, history of benign neoplasm of the colon.  The patient does have a history of pneumonia in 2009 and 2010.  The patient does have some neuropathy related to his chemotherapy with mild tingling and numbness.  This however has resolved.  SOCIAL HISTORY:  The patient lives in the [place] area with his wife. There is no history of tobacco use or alcohol intake.  The patient works full-time in [employer].  The patient's wife works as a Energy manager for 1 of the gynecologists here in town.  FAMILY HISTORY:  There is no family history of malignancy or leukemia.  REVIEW OF SYSTEMS:  As above, the patient presented with pain in the right shoulder area.  He does have worsening pain with abduction of his right arm.  The patient denies any numbness or tingling in his right arm or hand or focal motor weakness.  The patient has had some mild discomfort in the lower anterior rib cage area.  The patient denies any headaches, dizziness or blurred vision.  The patient's appetite is good. A complete review of systems is undertaken with the patient by myself and other than the above-mentioned issues is unremarkable.  PHYSICAL EXAMINATION:  General:  This is a very pleasant, healthy- appearing 56 year old gentleman in no acute distress.  Vital signs: Temperature 98.5, pulse  88, blood pressure 138/86, weight is 228 pounds. Examination of the pupils reveals them to be equal, round and reactive to light.  The extraocular eye movements are intact.  The tongue is midline.  There is no secondary infection noted in the oral cavity or posterior pharynx.  Cranial nerves 2-12 are intact.  Examination of the neck and supraclavicular region reveals no evidence of  adenopathy.  The axillary areas are free of adenopathy.  Examination of the lungs reveals them to be clear.  The heart has a regular rhythm and rate.  Examination of the abdomen reveals it to be soft and nontender with normal bowel sounds.  There is no obvious hepatosplenomegaly.  Careful examination along the right shoulder area and scapular reveals no point tenderness or palpable mass.  On neurological examination, motor strength appears to be 5/5 in the proximal and distal muscle groups of the upper and lower extremities.  As above, the patient does have some pain with abduction and raising of his right arm.  Peripheral pulses are good. There is no cyanosis, clubbing or edema noted in the extremities.  LABORATORY DATA:  From April 9th white count 3.1, absolute neutrophil count 1.5, hemoglobin 13.2, hematocrit 37.2, BUN 13, creatinine 0.98, calcium 8.9, albumin 4.2.  X-RAY STUDIES:  MRI findings as above.  IMPRESSION/PLAN:  Progressive multiple myeloma.  As above, the patient has a significant mass along the right shoulder/scapular area which is causing pain for the patient with limitation of arm and shoulder movement.  In light of these findings, I would recommend a course of palliative radiation therapy directed at the right shoulder/scapula area.  I discussed the overall treatment course, side effects and potential toxicities of radiation therapy in this situation with Mr. Speros and his wife.  The patient appears to understand and wishes to proceed with the planned course of treatment.  The patient will return tomorrow for CT simulation with treatments to begin next week.  I anticipate between 10 and 15 treatments as part of the patient's overall management.    ______________________________ Billie Lade, Ph.D., M.D. JDK/MEDQ  D:  10/30/2011  T:  10/30/2011  Job:  [id number]

## 2011-10-31 ENCOUNTER — Ambulatory Visit
Admission: RE | Admit: 2011-10-31 | Discharge: 2011-10-31 | Disposition: A | Discharge status: Home / Self Care | Payer: BC Managed Care – PPO | Source: Ambulatory Visit | Attending: Radiation Oncology | Admitting: Radiation Oncology

## 2011-10-31 DIAGNOSIS — C9000 Multiple myeloma not having achieved remission: Secondary | ICD-10-CM

## 2011-10-31 NOTE — Progress Notes (Signed)
Met with patient to discuss RO billing.  Dx:  203.00 Multiple myeloma without mention of remission  Rad Tx: [id number] Extrl Beam  Attending Rad: Dr. Roselind Messier

## 2011-10-31 NOTE — Progress Notes (Signed)
  Radiation Oncology         [phone]) [phone] ________________________________  Name: Chad [name] MRN: [id number]  Date: 10/31/2011  DOB: 08-21-1955  SIMULATION AND TREATMENT PLANNING NOTE  DIAGNOSIS:  Multiple Myeloma  NARRATIVE:  The patient was brought to the CT Simulation planning suite.  Identity was confirmed.  All relevant records and images related to the planned course of therapy were reviewed.  The patient freely provided informed written consent to proceed with treatment after reviewing the details related to the planned course of therapy. The consent form was witnessed and verified by the simulation staff.  Then, the patient was set-up in a stable reproducible  supine position for radiation therapy.  CT images were obtained.  Surface markings were placed.  The CT images were loaded into the planning software.  Then the target and avoidance structures were contoured.  Treatment planning then occurred.  The radiation prescription was entered and confirmed.  A total of 3 complex treatment devices( 2 MLC's & custom accuform mold) were fabricated. I have requested : Isodose Plan.  RAO/LPO beam arrangement.   PLAN:  The patient will receive 2500 cGy in 10 fractions.  ________________________________  Billie Lade, M.D.

## 2011-11-05 ENCOUNTER — Ambulatory Visit
Admission: RE | Admit: 2011-11-05 | Discharge: 2011-11-05 | Disposition: A | Discharge status: Home / Self Care | Payer: BC Managed Care – PPO | Source: Ambulatory Visit | Attending: Radiation Oncology | Admitting: Radiation Oncology

## 2011-11-05 DIAGNOSIS — C9000 Multiple myeloma not having achieved remission: Secondary | ICD-10-CM

## 2011-11-05 MED ORDER — RADIAPLEXRX EX GEL
Freq: Once | CUTANEOUS | Status: AC
  Administered 2011-11-05: 1 via TOPICAL

## 2011-11-05 NOTE — Progress Notes (Signed)
Post sim education completed. Routine of clinic reviewed.side effects of treatment reviewed.Patient given Radiation Therapy and You Booklet and radiaplex gel.calendar also given.

## 2011-11-05 NOTE — Progress Notes (Signed)
  Radiation Oncology         [phone] ________________________________  Name: Chad [name] MRN: [id number]  Date: 11/05/2011  DOB: July 22, 1955  Simulation Verification Note  Status: outpatient  NARRATIVE: The patient was brought to the treatment unit and placed in the planned treatment position. The clinical setup was verified. Then port films were obtained and uploaded to the radiation oncology medical record software.  The treatment beams were carefully compared against the planned radiation fields. The position location and shape of the radiation fields was reviewed. They targeted volume of tissue appears to be appropriately covered by the radiation beams. Organs at risk appear to be excluded as planned.  Based on my personal review, I approved the simulation verification. The patient's treatment will proceed as planned.  -----------------------------------  Billie Lade, PhD, MD

## 2011-11-06 ENCOUNTER — Ambulatory Visit
Admission: RE | Admit: 2011-11-06 | Discharge: 2011-11-06 | Disposition: A | Discharge status: Home / Self Care | Payer: BC Managed Care – PPO | Source: Ambulatory Visit | Attending: Radiation Oncology | Admitting: Radiation Oncology

## 2011-11-07 ENCOUNTER — Ambulatory Visit
Admission: RE | Admit: 2011-11-07 | Discharge: 2011-11-07 | Disposition: A | Discharge status: Home / Self Care | Payer: BC Managed Care – PPO | Source: Ambulatory Visit | Attending: Radiation Oncology | Admitting: Radiation Oncology

## 2011-11-08 ENCOUNTER — Ambulatory Visit
Admission: RE | Admit: 2011-11-08 | Discharge: 2011-11-08 | Disposition: A | Discharge status: Home / Self Care | Payer: BC Managed Care – PPO | Source: Ambulatory Visit | Attending: Radiation Oncology | Admitting: Radiation Oncology

## 2011-11-11 ENCOUNTER — Ambulatory Visit
Admission: RE | Admit: 2011-11-11 | Discharge: 2011-11-11 | Disposition: A | Discharge status: Home / Self Care | Payer: BC Managed Care – PPO | Source: Ambulatory Visit | Attending: Radiation Oncology | Admitting: Radiation Oncology

## 2011-11-12 ENCOUNTER — Ambulatory Visit
Admission: RE | Admit: 2011-11-12 | Discharge: 2011-11-12 | Disposition: A | Discharge status: Home / Self Care | Payer: BC Managed Care – PPO | Source: Ambulatory Visit | Attending: Radiation Oncology | Admitting: Radiation Oncology

## 2011-11-12 DIAGNOSIS — C9000 Multiple myeloma not having achieved remission: Secondary | ICD-10-CM

## 2011-11-12 NOTE — Progress Notes (Signed)
HERE TODAY FOR PUT RIGHT SCAPULA.  HAS PAIN 5/10 WITH MOVEMENT OF SHOULDER, USES EXTRA STRENGTH TYLENOL AND OR VICODIN WITH RELIEF.  SKIN IS GOOD PER PATIENT.  APPETITE IS DECREASED A LITTLE

## 2011-11-12 NOTE — Progress Notes (Signed)
   Department of Radiation Oncology  Phone:  [phone] Fax:        [phone]   Weekly Management Note Current Dose: 12.5  Gy  Projected Dose:25  Gy   Narrative:  The patient presents for routine under treatment assessment. Port film x-rays were reviewed.  The chart was checked.  He is tolerating his treatments well at this time without any side effects. This denies any itching in the treatment area. Patient's pain is minimal minimally better at this time. He denies pain anywhere else. Patient is  more fatigued this week as he is receiving Revlimid.  Physical Findings: The lungs are clear. The heart has a regular rhythm and rate. His sees have some limitation of movement of his right arm and shoulder.  Impression:  The patient is tolerating radiation.  Plan:  Continue treatment as planned.   ___________________  Billie Lade, M.D.

## 2011-11-13 ENCOUNTER — Ambulatory Visit
Admission: RE | Admit: 2011-11-13 | Discharge: 2011-11-13 | Disposition: A | Discharge status: Home / Self Care | Payer: BC Managed Care – PPO | Source: Ambulatory Visit | Attending: Radiation Oncology | Admitting: Radiation Oncology

## 2011-11-13 ENCOUNTER — Telehealth: Payer: Self-pay | Admitting: Pulmonary Disease

## 2011-11-13 ENCOUNTER — Telehealth: Payer: Self-pay | Admitting: *Deleted

## 2011-11-13 MED ORDER — HYDROCORTISONE 2.5 % RE CREA: TOPICAL_CREAM | RECTAL | Status: DC

## 2011-11-13 NOTE — Telephone Encounter (Signed)
Call from pt requesting MRI and Xray to be faxed to Dr. Lance Bosch. Same done.  Pt also requesting a rx for hemorrhoids, instructed him to contact his PCP. He agrees to do so.

## 2011-11-13 NOTE — Telephone Encounter (Signed)
Per SN---ok to send in anusol hc cream   2.5%  Apply to hemorrhoids after each BM and at bedtime.  thanks

## 2011-11-13 NOTE — Telephone Encounter (Signed)
Returning call can be reached at [phone].Chad [name]

## 2011-11-13 NOTE — Telephone Encounter (Signed)
lmomtcb x1 for jamie 

## 2011-11-13 NOTE — Telephone Encounter (Signed)
Spoke with pt's spouse and notified of recs per SN. She verbalized understanding and states nothing further needed. Rx was sent to pharm.

## 2011-11-13 NOTE — Telephone Encounter (Signed)
I spoke with Chad [name] and she states that pt currently has hemorrhoids and would like rx called in for this. She states they called oncology and they told her to call pt pcp for this. Pt currently is on chemo. Please advise SN thanks  Allergies  Allergen Reactions  . Lisinopril     REACTION: ALLERGIC to ACE w/ cough...  . Penicillins     rash  . Valsartan     REACTION: ALLERGIC to ARB w/ Urticaria \\T \ Angioedema     [facility]

## 2011-11-14 ENCOUNTER — Ambulatory Visit
Admission: RE | Admit: 2011-11-14 | Discharge: 2011-11-14 | Disposition: A | Discharge status: Home / Self Care | Payer: BC Managed Care – PPO | Source: Ambulatory Visit | Attending: Radiation Oncology | Admitting: Radiation Oncology

## 2011-11-15 ENCOUNTER — Ambulatory Visit: Payer: BC Managed Care – PPO

## 2011-11-15 ENCOUNTER — Other Ambulatory Visit: Payer: Self-pay | Admitting: *Deleted

## 2011-11-15 ENCOUNTER — Ambulatory Visit (HOSPITAL_BASED_OUTPATIENT_CLINIC_OR_DEPARTMENT_OTHER): Payer: BC Managed Care – PPO | Admitting: Oncology

## 2011-11-15 ENCOUNTER — Ambulatory Visit
Admission: RE | Admit: 2011-11-15 | Discharge: 2011-11-15 | Disposition: A | Discharge status: Home / Self Care | Payer: BC Managed Care – PPO | Source: Ambulatory Visit | Attending: Radiation Oncology | Admitting: Radiation Oncology

## 2011-11-15 DIAGNOSIS — C9000 Multiple myeloma not having achieved remission: Secondary | ICD-10-CM

## 2011-11-15 LAB — URINALYSIS, MICROSCOPIC - CHCC
Glucose: NEGATIVE g/dL
Ketones: NEGATIVE mg/dL
Nitrite: NEGATIVE
Specific Gravity, Urine: 1.03 (ref 1.003–1.035)

## 2011-11-15 LAB — CBC WITH DIFFERENTIAL/PLATELET
BASO%: 1.4 % (ref 0.0–2.0)
Eosinophils Absolute: 0.4 10*3/uL (ref 0.0–0.5)
MCHC: 34.4 g/dL (ref 32.0–36.0)
MONO#: 0.6 10*3/uL (ref 0.1–0.9)
NEUT#: 2.8 10*3/uL (ref 1.5–6.5)
RBC: 3.95 10*6/uL — ABNORMAL LOW (ref 4.20–5.82)
RDW: 14.1 % (ref 11.0–14.6)
WBC: 4.7 10*3/uL (ref 4.0–10.3)
nRBC: 0 % (ref 0–0)

## 2011-11-15 MED ORDER — LENALIDOMIDE 25 MG PO CAPS: 25.0000 mg | ORAL_CAPSULE | Freq: Every day | ORAL | Status: DC

## 2011-11-15 MED ORDER — DILTIAZEM GEL 2 %: 1.0000 "application " | Freq: Two times a day (BID) | CUTANEOUS | Status: DC

## 2011-11-15 NOTE — Progress Notes (Signed)
OFFICE PROGRESS NOTE   INTERVAL HISTORY:   He returns for an unscheduled visit. He completed a second cycle of Revlimid on 11/14/2011. He is completing palliative radiation to the right scapula. 2 fractions of radiation remaining. The discomfort at the right shoulder has partially improved.  He developed a "hemorrhoid "discomfort beginning on 11/12/2011. He has pain at the anus. His primary physician prescribed hydrocortisone cream. He notes partial relief after applying the cream. Beginning on 11/14/2011 he developed urinary hesitancy. This has persisted today.He notes pain at the anus when he attempts to pass the urine.  No back pain or leg weakness. Neuropathy symptoms in the feet similar to when he took Revlimid in the past.     Objective:  Vital signs in last 24 hours:  Blood pressure 161/93, pulse 94, temperature 98.7 F (37.1 C), temperature source Oral, height 6' (1.829 m), weight 223 lb 14.4 oz (101.56 kg).    HEENT: No thrush  Resp:  lungs clear bilaterally Cardio:  regular rate and rhythm GI: No hepatosplenomegaly, nontender, the bladder is not distended  Vascular:  no leg edema Neuro: alert and oriented, the motor exam appears intact in the upper and lower extremities.   Rectal: Normal to increased tone, the prostate is nontender. No mass. Small hemorrhoids are palpated at the anal verge. At the posterior anal verge there is tenderness and irregularity of the mucosa,? Fissure or tear.    Lab Results:  Lab Results  Component Value Date   WBC 4.7 11/15/2011   HGB 12.3* 11/15/2011   HCT 35.9* 11/15/2011   MCV 90.8 11/15/2011   PLT 204 11/15/2011    ANC 2.8 Urinalysis-0-2 white cells, large mucous, trace bacteria, 0-2 red cells, moderate blood, leukocyte esterase negative, nitrite negative   Medications: I have reviewed the patient's current medications.  Assessment/Plan: 1. Multiple myeloma-status post high-dose chemotherapy with autologous stem cell support in November  of 2004. The sero monoclonal protein was slowly rising and a restaging bone marrow biopsy in November 2009 confirmed 10 to 20% plasma cells. The serum light chains were elevated 07/25/2008. He completed 12 cycles of salvage therapy with Revlimid/Decadron with the last cycle initiated 06/16/2009. On 07/13/2009 the serum free light chains were normal and a serum protein electrophoresis/immunofixation revealed no monoclonal protein. Elevated serum M spike and serum free kappa light chains March 2013. He began cycle 1 of Revlimid 25 mg daily x21 days followed by a 7 day break on 09/27/2011. He began a second cycle of salvage Revlimid beginning on 10/25/2011. 2. A serum immunofixation confirmed a monoclonal protein and the serum Kappa and Lambda light chains were mildly elevated in June of 2012.  3. History of elevated liver enzymes. 4. Right lung pneumonia June, 2009 -- resolved after treatment with Bactrim and azithromycin. 5. 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  6. History of hypokalemia -he is no longer taking a potassium supplement 7. Pneumonia 01/06/2009 -- requiring hospitalization 01/10/2011 -- status post broad spectrum antibiotic therapy with clinical resolution. 8. Upper respiratory infection in February, 2011 -- likely a viral URI.  9. Right shoulder/upper arm pain. Plain x-ray of the right shoulder/humerus on 10/16/2011 showed 2 small lytic lesions at the proximal humeral shaft suspicious for myeloma involvement. MRI of the right shoulder 10/27/2011 confirmed multiple myelomatous lesions involving the proximal right humerus, scapula, and ribs. A large destructive lesion was noted in the scapular body with an associated soft tissue component.  10.  "hemorrhoid "discomfort and urinary hesitancy-he appears to have an ulcer or fissure at the anal verge. I suspect the discomfort related to this area is causing sphincter spasm  and urinary hesitancy. I have a low clinical suspicion for an infection or primary neurologic process. I discussed the case with gastroenterology. He will be placed on diltiazem cream in addition to the hydrocortisone cream. He will begin sitz baths.   Disposition:  Mr. Cryer will complete radiation next week. He will return for a lab 11/19/2011 visit to assess the response to Revlimid.  He knows to contact us if he develops urinary retention or if the rectal symptoms persist. He will return for an office visit as scheduled on 11/22/2011.    Thornton Papas, MD  11/15/2011  5:55 PM

## 2011-11-18 ENCOUNTER — Ambulatory Visit
Admission: RE | Admit: 2011-11-18 | Discharge: 2011-11-18 | Disposition: A | Discharge status: Home / Self Care | Payer: BC Managed Care – PPO | Source: Ambulatory Visit | Attending: Radiation Oncology | Admitting: Radiation Oncology

## 2011-11-18 ENCOUNTER — Telehealth: Payer: Self-pay | Admitting: *Deleted

## 2011-11-18 NOTE — Telephone Encounter (Signed)
Called to report as of Sunday, his rectal pain is much improved and he is able to void without difficulty again. Plans to continue using the cream for a few more days.

## 2011-11-19 ENCOUNTER — Ambulatory Visit
Admission: RE | Admit: 2011-11-19 | Discharge: 2011-11-19 | Disposition: A | Discharge status: Home / Self Care | Payer: BC Managed Care – PPO | Source: Ambulatory Visit | Attending: Radiation Oncology | Admitting: Radiation Oncology

## 2011-11-19 ENCOUNTER — Other Ambulatory Visit (HOSPITAL_BASED_OUTPATIENT_CLINIC_OR_DEPARTMENT_OTHER): Payer: BC Managed Care – PPO | Admitting: Lab

## 2011-11-19 VITALS — Wt 221.8 lb

## 2011-11-19 DIAGNOSIS — C9000 Multiple myeloma not having achieved remission: Secondary | ICD-10-CM

## 2011-11-19 LAB — CBC WITH DIFFERENTIAL/PLATELET
BASO%: 3.4 % — ABNORMAL HIGH (ref 0.0–2.0)
Basophils Absolute: 0.1 10*3/uL (ref 0.0–0.1)
Eosinophils Absolute: 0.2 10*3/uL (ref 0.0–0.5)
HCT: 35.2 % — ABNORMAL LOW (ref 38.4–49.9)
HGB: 12.2 g/dL — ABNORMAL LOW (ref 13.0–17.1)
LYMPH%: 31 % (ref 14.0–49.0)
MCHC: 34.7 g/dL (ref 32.0–36.0)
MONO#: 0.3 10*3/uL (ref 0.1–0.9)
NEUT#: 1.4 10*3/uL — ABNORMAL LOW (ref 1.5–6.5)
NEUT%: 49.6 % (ref 39.0–75.0)
Platelets: 241 10*3/uL (ref 140–400)
WBC: 2.9 10*3/uL — ABNORMAL LOW (ref 4.0–10.3)
lymph#: 0.9 10*3/uL (ref 0.9–3.3)

## 2011-11-19 NOTE — Progress Notes (Signed)
HERE FOR PUT OF RIGHT SCAPULA, FINAL TX.  PAIN LESS WITH MOVEMENT, RATES 5/10.  TAKES TYLENOL IF NEEDED,  SAYS APPETITE IS LESS ALSO.

## 2011-11-19 NOTE — Progress Notes (Signed)
   Department of Radiation Oncology  Phone:  [phone] Fax:        [phone]   Weekly Management Note Current Dose: 25  Gy  Projected Dose:25  Gy   Narrative:  The patient presents for routine under treatment assessment.  Port film x-rays were reviewed.  The chart was checked. Patient completes his therapy today. He has had some improvement in his pain and some improvement in mobility but not a significant amount.                                                                                                                                                                                                                                                                                      Exam  there is some erythema to the skin of the radiation portals. The lungs are clear. The heart is regular rhythm and rate.   Impression:  The patient has tolerated radiation well  Plan:  Followup in one month.  -----------------------------------  Billie Lade, PhD, MD

## 2011-11-20 ENCOUNTER — Ambulatory Visit: Payer: BC Managed Care – PPO

## 2011-11-20 NOTE — Progress Notes (Signed)
  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       Radiation treatment dates:   11/06/2011 through 11/19/2011  Site/dose:   Right scapula/shoulder 2500 cGy in 10 fractions ( 250 cGy per fraction).  Beams/energy:   Right anterior oblique and left posterior oblique field arrangement. The patient was treated with combination of 18 and 10 MV photons.  Narrative: The patient tolerated radiation treatment relatively well.   He did have some improvement in his pain and range of movement with his right shoulder but not a significant improvement thus far.  Plan: The patient has completed radiation treatment. The patient will return to radiation oncology clinic for routine followup in one month. I advised them to call or return sooner if they have any questions or concerns related to their recovery or treatment.  -----------------------------------  Billie Lade, PhD, MD

## 2011-11-21 ENCOUNTER — Ambulatory Visit: Payer: BC Managed Care – PPO

## 2011-11-21 LAB — PROTEIN ELECTROPHORESIS, SERUM
Albumin ELP: 47.4 % — ABNORMAL LOW (ref 55.8–66.1)
Alpha-1-Globulin: 5.4 % — ABNORMAL HIGH (ref 2.9–4.9)
Alpha-2-Globulin: 14.1 % — ABNORMAL HIGH (ref 7.1–11.8)
Beta 2: 23.5 % — ABNORMAL HIGH (ref 3.2–6.5)
Beta Globulin: 4.9 % (ref 4.7–7.2)
Gamma Globulin: 4.7 % — ABNORMAL LOW (ref 11.1–18.8)

## 2011-11-21 LAB — COMPREHENSIVE METABOLIC PANEL
AST: 17 U/L (ref 0–37)
Albumin: 3.8 g/dL (ref 3.5–5.2)
Alkaline Phosphatase: 51 U/L (ref 39–117)
BUN: 14 mg/dL (ref 6–23)
Glucose, Bld: 94 mg/dL (ref 70–99)
Potassium: 4 mEq/L (ref 3.5–5.3)
Total Bilirubin: 0.5 mg/dL (ref 0.3–1.2)

## 2011-11-21 LAB — KAPPA/LAMBDA LIGHT CHAINS: Kappa:Lambda Ratio: 16.83 — ABNORMAL HIGH (ref 0.26–1.65)

## 2011-11-22 ENCOUNTER — Other Ambulatory Visit: Payer: Self-pay | Admitting: *Deleted

## 2011-11-22 ENCOUNTER — Ambulatory Visit: Payer: BC Managed Care – PPO | Admitting: Oncology

## 2011-11-22 ENCOUNTER — Ambulatory Visit: Payer: BC Managed Care – PPO

## 2011-11-22 ENCOUNTER — Ambulatory Visit (HOSPITAL_BASED_OUTPATIENT_CLINIC_OR_DEPARTMENT_OTHER): Payer: BC Managed Care – PPO | Admitting: Oncology

## 2011-11-22 ENCOUNTER — Telehealth: Payer: Self-pay | Admitting: Oncology

## 2011-11-22 VITALS — BP 139/87 | HR 74 | Temp 98.9°F | Ht 72.0 in | Wt 222.2 lb

## 2011-11-22 DIAGNOSIS — C7951 Secondary malignant neoplasm of bone: Secondary | ICD-10-CM

## 2011-11-22 DIAGNOSIS — C9000 Multiple myeloma not having achieved remission: Secondary | ICD-10-CM

## 2011-11-22 MED ORDER — DEXAMETHASONE 4 MG PO TABS: 40.0000 mg | ORAL_TABLET | ORAL | Status: AC

## 2011-11-22 NOTE — Progress Notes (Signed)
[facility]    OFFICE PROGRESS NOTE   INTERVAL HISTORY:   He returns as scheduled. He reports resolution of the urinary hesitancy after the office visit on 11/15/2011. On 11/16/2011 he had rectal bleeding. This has resolved. He relates this to hemorrhoid bleeding.  He is scheduled to begin a third cycle of Revlimid today. The right shoulder discomfort has partially improved following radiation. No further tingling in his feet while off of Revlimid this week.  He saw Dr. Lance Bosch at [facility] earlier this week.  Objective:  Vital signs in last 24 hours:  Blood pressure 139/87, pulse 74, temperature 98.9 F (37.2 C), temperature source Oral, height 6' (1.829 m), weight 222 lb 3.2 oz (100.789 kg).    HEENT: No thrush Resp: Lungs clear bilaterally Cardio: Regular rate and rhythm GI: No hepatosplenomegaly, nontender Vascular: No leg edema    Lab Results:  Lab Results  Component Value Date   WBC 2.9* 11/19/2011   HGB 12.2* 11/19/2011   HCT 35.2* 11/19/2011   MCV 90.1 11/19/2011   PLT 241 11/19/2011   ANC 1.4 Potassium 4.0, creatinine 0.95, calcium 9.0  Serum M spike 1.36 (1.42 on 09/17/2011) Free kappa light chains 37.2 (27.2 on 09/17/2011)    Medications: I have reviewed the patient's current medications.  Assessment/Plan: 1. Multiple myeloma-status post high-dose chemotherapy with autologous stem cell support in November of 2004. The sero monoclonal protein was slowly rising and a restaging bone marrow biopsy in November 2009 confirmed 10 to 20% plasma cells. The serum light chains were elevated 07/25/2008. He completed 12 cycles of salvage therapy with Revlimid/Decadron with the last cycle initiated 06/16/2009. On 07/13/2009 the serum free light chains were normal and a serum protein electrophoresis/immunofixation revealed no monoclonal protein. Elevated serum M spike and serum free kappa light chains March 2013. He began cycle 1 of Revlimid 25 mg daily x21 days followed  by a 7 day break on 09/27/2011. He began a second cycle of salvage Revlimid beginning on 10/25/2011.    -The serum M spike was stable and the serum free kappa light chains were slightly more elevated on 11/19/2011. 2. A serum immunofixation confirmed a monoclonal protein and the serum Kappa and Lambda light chains were mildly elevated in June of 2012.  3. History of elevated liver enzymes. 4. Right lung pneumonia June, 2009 -- resolved after treatment with Bactrim and azithromycin. 5. 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  6. History of hypokalemia -he is no longer taking a potassium supplement 7. Pneumonia 01/06/2009 -- requiring hospitalization 01/10/2011 -- status post broad spectrum antibiotic therapy with clinical resolution. 8. Upper respiratory infection in February, 2011 -- likely a viral URI.  9. Right shoulder/upper arm pain. Plain x-ray of the right shoulder/humerus on 10/16/2011 showed 2 small lytic lesions at the proximal humeral shaft suspicious for myeloma involvement. MRI of the right shoulder 10/27/2011 confirmed multiple myelomatous lesions involving the proximal right humerus, scapula, and ribs. A large destructive lesion was noted in the scapular body with an associated soft tissue component. He completed palliative radiation to the right scapula on 11/19/2011 with partial improvement in the shoulder discomfort.       10. "hemorrhoid "discomfort and urinary hesitancy 11/15/2011-he appeared to have an ulcer or fissure at the anal verge. His symptoms have resolved. He will discontinue the diltiazem gel.  Disposition:  The urinary/rectal symptoms have resolved. I suspect the discomfort/bleeding was related to a  fissure or hemorrhoids. Anal spasm likely caused the urinary hesitancy. We will make a GI referral if he has recurrent symptoms.  He has completed radiation to the right shoulder with partial relief of  pain. He has completed 2 cycles of single agent Revlimid without a significant improvement in the serum M spike or serum free kappa light chains. I discussed treatment options with Mr. Lindblad and his wife. He saw Dr. Lance Bosch earlier this week. We decided to add Decadron to the Revlimid regimen. He will begin a third cycle of Revlimid with weekly Decadron today. I will communicate with Dr. Lance Bosch regarding the indication for adding Velcade.  Mr. Tupper will be restarted on Zometa given the recent x-ray evidence of lytic bone lesions.  He will return for an office and lab visit on 12/16/2011     Thornton Papas, MD  11/22/2011  2:12 PM

## 2011-11-22 NOTE — Telephone Encounter (Signed)
Gv pt appt for may and [id number]

## 2011-11-24 ENCOUNTER — Other Ambulatory Visit: Payer: Self-pay | Admitting: Oncology

## 2011-11-25 ENCOUNTER — Ambulatory Visit: Payer: BC Managed Care – PPO

## 2011-11-26 ENCOUNTER — Ambulatory Visit: Payer: BC Managed Care – PPO

## 2011-11-27 ENCOUNTER — Telehealth: Payer: Self-pay | Admitting: *Deleted

## 2011-11-27 DIAGNOSIS — C9000 Multiple myeloma not having achieved remission: Secondary | ICD-10-CM

## 2011-11-27 NOTE — Telephone Encounter (Signed)
Left VM that Dr. Lance Bosch and Dr. Truett Perna spoke and have decided to add SQ Velcade weekly to his treatment regimen. Will begin this Friday when he is here for Zometa tx.

## 2011-11-29 ENCOUNTER — Other Ambulatory Visit: Payer: Self-pay | Admitting: *Deleted

## 2011-11-29 ENCOUNTER — Ambulatory Visit (HOSPITAL_BASED_OUTPATIENT_CLINIC_OR_DEPARTMENT_OTHER): Payer: BC Managed Care – PPO

## 2011-11-29 ENCOUNTER — Encounter: Payer: Self-pay | Admitting: *Deleted

## 2011-11-29 ENCOUNTER — Other Ambulatory Visit: Payer: Self-pay | Admitting: Oncology

## 2011-11-29 VITALS — BP 122/79 | HR 66 | Temp 99.1°F

## 2011-11-29 DIAGNOSIS — C9000 Multiple myeloma not having achieved remission: Secondary | ICD-10-CM

## 2011-11-29 DIAGNOSIS — Z5112 Encounter for antineoplastic immunotherapy: Secondary | ICD-10-CM

## 2011-11-29 MED ORDER — ZOLEDRONIC ACID 4 MG/100ML IV SOLN
4.0000 mg | Freq: Once | INTRAVENOUS | Status: AC
  Administered 2011-11-29: 4 mg via INTRAVENOUS
  Filled 2011-11-29: qty 100

## 2011-11-29 MED ORDER — PROCHLORPERAZINE MALEATE 10 MG PO TABS: 10.0000 mg | ORAL_TABLET | Freq: Four times a day (QID) | ORAL | Status: DC | PRN

## 2011-11-29 MED ORDER — BORTEZOMIB CHEMO SQ INJECTION 3.5 MG (2.5MG/ML)
1.3000 mg/m2 | Freq: Once | INTRAMUSCULAR | Status: AC
  Administered 2011-11-29: 3 mg via SUBCUTANEOUS
  Filled 2011-11-29: qty 3

## 2011-11-29 MED ORDER — ACYCLOVIR 400 MG PO TABS: 400.0000 mg | ORAL_TABLET | Freq: Every day | ORAL | Status: DC

## 2011-11-29 MED ORDER — ONDANSETRON HCL 8 MG PO TABS
8.0000 mg | ORAL_TABLET | Freq: Once | ORAL | Status: AC
  Administered 2011-11-29: 8 mg via ORAL

## 2011-11-29 NOTE — Patient Instructions (Signed)
[facility] Discharge Instructions for Patients Receiving Chemotherapy  Today you received the following chemotherapy agents Velcade/Zometa  To help prevent nausea and vomiting after your treatment, we encourage you to take your nausea medications.  Begin taking it at 7pm and take it as often as prescribed for the next 24-48 hours.   Revlimid should be taken for 14 days and then stop. Please see new schedule for Velcade. Also, please make sure to take decadron/dexamethasone as scheduled. Pick up prescription for Acyclovir as well.  Contact us if you have any scheduling or medications questions.    If you develop nausea and vomiting that is not controlled by your nausea medication, call the clinic. If it is after clinic hours your family physician or the after hours number for the clinic or go to the Emergency Department.   BELOW ARE SYMPTOMS THAT SHOULD BE REPORTED IMMEDIATELY:  *FEVER GREATER THAN 100.5 F  *CHILLS WITH OR WITHOUT FEVER  NAUSEA AND VOMITING THAT IS NOT CONTROLLED WITH YOUR NAUSEA MEDICATION  *UNUSUAL SHORTNESS OF BREATH  *UNUSUAL BRUISING OR BLEEDING  TENDERNESS IN MOUTH AND THROAT WITH OR WITHOUT PRESENCE OF ULCERS  *URINARY PROBLEMS  *BOWEL PROBLEMS  UNUSUAL RASH Items with * indicate a potential emergency and should be followed up as soon as possible.  One of the nurses will contact you 24 hours after your treatment. Please let the nurse know about any problems that you may have experienced. Feel free to call the clinic you have any questions or concerns. The clinic phone number is [phone].   I have been informed and understand all the instructions given to me. I know to contact the clinic, my physician, or go to the Emergency Department if any problems should occur. I do not have any questions at this time, but understand that I may call the clinic during office hours   should I have any questions or need assistance in obtaining follow  up care.    __________________________________________  _____________  __________ Signature of Patient or Authorized Representative            Date                   Time    __________________________________________ Nurse's Signature

## 2011-12-02 ENCOUNTER — Telehealth: Payer: Self-pay | Admitting: *Deleted

## 2011-12-02 NOTE — Telephone Encounter (Signed)
Chemo f/u call 

## 2011-12-02 NOTE — Telephone Encounter (Signed)
Message left requesting a return call for f/u. 

## 2011-12-02 NOTE — Telephone Encounter (Signed)
Message copied by Augusto Garbe on Mon Dec 02, 2011  9:46 AM ------      Message from: Melton Krebs D      Created: Fri Nov 29, 2011 12:37 PM      Regarding: 1st SQ Velcade Injection Patient Follow Up (Dr. Truett Perna)       Chad [name] received his first dose of Sub-Q Velcade on 11/29/2011. He is a patient of Dr. Truett Perna. Would you please follow up?            Thank You,

## 2011-12-06 ENCOUNTER — Ambulatory Visit (HOSPITAL_BASED_OUTPATIENT_CLINIC_OR_DEPARTMENT_OTHER): Payer: BC Managed Care – PPO

## 2011-12-06 ENCOUNTER — Other Ambulatory Visit (HOSPITAL_BASED_OUTPATIENT_CLINIC_OR_DEPARTMENT_OTHER): Payer: BC Managed Care – PPO | Admitting: Lab

## 2011-12-06 VITALS — BP 146/86 | HR 97 | Temp 98.4°F

## 2011-12-06 DIAGNOSIS — Z5112 Encounter for antineoplastic immunotherapy: Secondary | ICD-10-CM

## 2011-12-06 DIAGNOSIS — C9000 Multiple myeloma not having achieved remission: Secondary | ICD-10-CM

## 2011-12-06 LAB — CBC WITH DIFFERENTIAL/PLATELET
Basophils Absolute: 0.1 10*3/uL (ref 0.0–0.1)
Eosinophils Absolute: 0.4 10*3/uL (ref 0.0–0.5)
HGB: 13.3 g/dL (ref 13.0–17.1)
MONO#: 0.5 10*3/uL (ref 0.1–0.9)
MONO%: 9.2 % (ref 0.0–14.0)
NEUT#: 3.2 10*3/uL (ref 1.5–6.5)
RBC: 4.25 10*6/uL (ref 4.20–5.82)
RDW: 14.5 % (ref 11.0–14.6)
WBC: 5.1 10*3/uL (ref 4.0–10.3)
lymph#: 1 10*3/uL (ref 0.9–3.3)

## 2011-12-06 MED ORDER — ONDANSETRON HCL 8 MG PO TABS
8.0000 mg | ORAL_TABLET | Freq: Once | ORAL | Status: AC
  Administered 2011-12-06: 8 mg via ORAL

## 2011-12-06 MED ORDER — BORTEZOMIB CHEMO SQ INJECTION 3.5 MG (2.5MG/ML)
1.3000 mg/m2 | Freq: Once | INTRAMUSCULAR | Status: AC
  Administered 2011-12-06: 3 mg via SUBCUTANEOUS
  Filled 2011-12-06: qty 3

## 2011-12-06 NOTE — Patient Instructions (Signed)
[facility] Discharge Instructions for Patients Receiving Chemotherapy  Today you received the following chemotherapy agents velcade  To help prevent nausea and vomiting after your treatment, we encourage you to take your nausea medication and take it as often as prescribed   If you develop nausea and vomiting that is not controlled by your nausea medication, call the clinic. If it is after clinic hours your family physician or the after hours number for the clinic or go to the Emergency Department.   BELOW ARE SYMPTOMS THAT SHOULD BE REPORTED IMMEDIATELY:  *FEVER GREATER THAN 100.5 F  *CHILLS WITH OR WITHOUT FEVER  NAUSEA AND VOMITING THAT IS NOT CONTROLLED WITH YOUR NAUSEA MEDICATION  *UNUSUAL SHORTNESS OF BREATH  *UNUSUAL BRUISING OR BLEEDING  TENDERNESS IN MOUTH AND THROAT WITH OR WITHOUT PRESENCE OF ULCERS  *URINARY PROBLEMS  *BOWEL PROBLEMS  UNUSUAL RASH Items with * indicate a potential emergency and should be followed up as soon as possible.  One of the nurses will contact you 24 hours after your treatment. Please let the nurse know about any problems that you may have experienced. Feel free to call the clinic you have any questions or concerns. The clinic phone number is [phone].   I have been informed and understand all the instructions given to me. I know to contact the clinic, my physician, or go to the Emergency Department if any problems should occur. I do not have any questions at this time, but understand that I may call the clinic during office hours   should I have any questions or need assistance in obtaining follow up care.    __________________________________________  _____________  __________ Signature of Patient or Authorized Representative            Date                   Time    __________________________________________ Nurse's Signature    

## 2011-12-11 ENCOUNTER — Other Ambulatory Visit: Payer: Self-pay | Admitting: *Deleted

## 2011-12-11 MED ORDER — LENALIDOMIDE 25 MG PO CAPS: 25.0000 mg | ORAL_CAPSULE | Freq: Every day | ORAL | Status: DC

## 2011-12-12 ENCOUNTER — Other Ambulatory Visit: Payer: Self-pay | Admitting: Oncology

## 2011-12-13 ENCOUNTER — Ambulatory Visit (HOSPITAL_BASED_OUTPATIENT_CLINIC_OR_DEPARTMENT_OTHER): Payer: BC Managed Care – PPO

## 2011-12-13 ENCOUNTER — Other Ambulatory Visit (HOSPITAL_BASED_OUTPATIENT_CLINIC_OR_DEPARTMENT_OTHER): Payer: BC Managed Care – PPO | Admitting: Lab

## 2011-12-13 VITALS — BP 130/78 | HR 79 | Temp 97.8°F

## 2011-12-13 DIAGNOSIS — C9000 Multiple myeloma not having achieved remission: Secondary | ICD-10-CM

## 2011-12-13 DIAGNOSIS — Z5112 Encounter for antineoplastic immunotherapy: Secondary | ICD-10-CM

## 2011-12-13 LAB — CBC WITH DIFFERENTIAL/PLATELET
Basophils Absolute: 0.1 10*3/uL (ref 0.0–0.1)
Eosinophils Absolute: 0.2 10*3/uL (ref 0.0–0.5)
HGB: 12.6 g/dL — ABNORMAL LOW (ref 13.0–17.1)
LYMPH%: 34.4 % (ref 14.0–49.0)
MCV: 88.1 fL (ref 79.3–98.0)
MONO#: 0.3 10*3/uL (ref 0.1–0.9)
MONO%: 9.4 % (ref 0.0–14.0)
NEUT#: 1.6 10*3/uL (ref 1.5–6.5)
Platelets: 186 10*3/uL (ref 140–400)
RBC: 4.12 10*6/uL — ABNORMAL LOW (ref 4.20–5.82)
RDW: 15.2 % — ABNORMAL HIGH (ref 11.0–14.6)
WBC: 3.4 10*3/uL — ABNORMAL LOW (ref 4.0–10.3)

## 2011-12-13 MED ORDER — ONDANSETRON HCL 8 MG PO TABS
8.0000 mg | ORAL_TABLET | Freq: Once | ORAL | Status: AC
  Administered 2011-12-13: 8 mg via ORAL

## 2011-12-13 MED ORDER — BORTEZOMIB CHEMO SQ INJECTION 3.5 MG (2.5MG/ML)
1.3000 mg/m2 | Freq: Once | INTRAMUSCULAR | Status: AC
  Administered 2011-12-13: 3 mg via SUBCUTANEOUS
  Filled 2011-12-13: qty 3

## 2011-12-13 NOTE — Patient Instructions (Signed)
Pt will call with problems and questions

## 2011-12-16 ENCOUNTER — Telehealth: Payer: Self-pay | Admitting: *Deleted

## 2011-12-16 ENCOUNTER — Ambulatory Visit (HOSPITAL_BASED_OUTPATIENT_CLINIC_OR_DEPARTMENT_OTHER): Payer: BC Managed Care – PPO | Admitting: Oncology

## 2011-12-16 ENCOUNTER — Other Ambulatory Visit (HOSPITAL_BASED_OUTPATIENT_CLINIC_OR_DEPARTMENT_OTHER): Payer: BC Managed Care – PPO | Admitting: Lab

## 2011-12-16 ENCOUNTER — Telehealth: Payer: Self-pay | Admitting: Oncology

## 2011-12-16 VITALS — BP 145/89 | HR 69 | Temp 99.6°F | Ht 72.0 in | Wt 219.8 lb

## 2011-12-16 DIAGNOSIS — C9000 Multiple myeloma not having achieved remission: Secondary | ICD-10-CM

## 2011-12-16 DIAGNOSIS — M25519 Pain in unspecified shoulder: Secondary | ICD-10-CM

## 2011-12-16 LAB — CBC WITH DIFFERENTIAL/PLATELET
Basophils Absolute: 0.1 10*3/uL (ref 0.0–0.1)
EOS%: 0.6 % (ref 0.0–7.0)
Eosinophils Absolute: 0 10*3/uL (ref 0.0–0.5)
HGB: 12.7 g/dL — ABNORMAL LOW (ref 13.0–17.1)
LYMPH%: 41.4 % (ref 14.0–49.0)
MCH: 31.9 pg (ref 27.2–33.4)
MCV: 93.1 fL (ref 79.3–98.0)
MONO%: 11.2 % (ref 0.0–14.0)
NEUT#: 1.4 10*3/uL — ABNORMAL LOW (ref 1.5–6.5)
Platelets: 166 10*3/uL (ref 140–400)
RBC: 3.98 10*6/uL — ABNORMAL LOW (ref 4.20–5.82)
RDW: 16.2 % — ABNORMAL HIGH (ref 11.0–14.6)

## 2011-12-16 NOTE — Telephone Encounter (Signed)
Per staff message from Eunice, I have scheduled treatment appts.  JMW  

## 2011-12-16 NOTE — Telephone Encounter (Signed)
called pts home lmovm that his appts for june where complete and he can pick june and july scheduled on 06/12

## 2011-12-16 NOTE — Progress Notes (Signed)
Chad [name]    OFFICE PROGRESS NOTE   INTERVAL HISTORY:   He began Velcade on 11/29/2011. He completed 2 weeks of Revlimid on may 23rd 2013. He continues weekly Decadron.  Chad [name] has noted improvement in the right shoulder pain. He now has pain with abduction at the right shoulder. No other pain. No significant neuropathy symptoms. He reports no further difficulty with urination or bowel movements.  Objective:  Vital signs in last 24 hours:  Blood pressure 145/89, pulse 69, temperature 99.6 F (37.6 C), temperature source Oral, height 6' (1.829 m), weight 219 lb 12.8 oz (99.701 kg).    HEENT: No thrush or ulcers Resp: Lungs clear bilaterally Cardio: Regular rate and rhythm GI: Nontender, no hepatosplenomegaly Vascular: No leg edema  Lab Results:  Lab Results  Component Value Date   WBC 3.1* 12/16/2011   HGB 12.7* 12/16/2011   HCT 37.0* 12/16/2011   MCV 93.1 12/16/2011   PLT 166 12/16/2011   ANC 1.4    Medications: I have reviewed the patient's current medications.  Assessment/Plan: 1. Multiple myeloma-status post high-dose chemotherapy with autologous stem cell support in November of 2004. The sero monoclonal protein was slowly rising and a restaging bone marrow biopsy in November 2009 confirmed 10 to 20% plasma cells. The serum light chains were elevated 07/25/2008. He completed 12 cycles of salvage therapy with Revlimid/Decadron with the last cycle initiated 06/16/2009. On 07/13/2009 the serum free light chains were normal and a serum protein electrophoresis/immunofixation revealed no monoclonal protein. Elevated serum M spike and serum free kappa light chains March 2013. He began cycle 1 of Revlimid 25 mg daily x21 days followed by a 7 day break on 09/27/2011. He began a second cycle of salvage Revlimid beginning on 10/25/2011. -The serum M spike was stable and the serum free kappa light chains were slightly more elevated on 11/19/2011.       -He began another  cycle of Revlimid/Decadron on 11/22/2011. Velcade was added beginning on 11/29/2011. 2. A serum immunofixation confirmed a monoclonal protein and the serum Kappa and Lambda light chains were mildly elevated in June of 2012.  3. History of elevated liver enzymes. 4. Right lung pneumonia June, 2009 -- resolved after treatment with Bactrim and azithromycin. 5. 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  6. History of hypokalemia -he is no longer taking a potassium supplement 7. Pneumonia 01/06/2009 -- requiring hospitalization 01/10/2011 -- status post broad spectrum antibiotic therapy with clinical resolution. 8. Upper respiratory infection in February, 2011 -- likely a viral URI.  9. Right shoulder/upper arm pain. Plain x-ray of the right shoulder/humerus on 10/16/2011 showed 2 small lytic lesions at the proximal humeral shaft suspicious for myeloma involvement. MRI of the right shoulder 10/27/2011 confirmed multiple myelomatous lesions involving the proximal right humerus, scapula, and ribs. A large destructive lesion was noted in the scapular body with an associated soft tissue component. He completed palliative radiation to the right scapula on 11/19/2011 with partial improvement in the shoulder discomfort. 10. "hemorrhoid "discomfort and urinary hesitancy 11/15/2011-he appeared to have an ulcer or fissure at the anal verge. His symptoms have resolved.    Disposition:  He appears stable. He has completed one cycle of treatment with Revlimid (14 days), Velcade (3 weeks) and weekly Decadron therapy.  We will followup on the serum light chains from today. He is scheduled to begin another cycle of Revlimid on 12/19/2012. He will begin  the next 3 week cycle of Velcade on 12/27/2011 because he has completed 3 consecutive weeks of Velcade therapy.  He will return for the next restaging evaluation on 01/10/2012. Chad [name] is scheduled  for an office visit on 01/13/2012.  The plan is to get him back to Dr. Lance Bosch if he is responding to the RVD regimen.  Thornton Papas, MD  12/16/2011  5:44 PM

## 2011-12-17 ENCOUNTER — Other Ambulatory Visit: Payer: Self-pay | Admitting: *Deleted

## 2011-12-17 MED ORDER — LENALIDOMIDE 25 MG PO CAPS: 25.0000 mg | ORAL_CAPSULE | Freq: Every day | ORAL | Status: DC

## 2011-12-18 ENCOUNTER — Telehealth: Payer: Self-pay | Admitting: *Deleted

## 2011-12-18 LAB — PROTEIN ELECTROPHORESIS, SERUM
Albumin ELP: 58.5 % (ref 55.8–66.1)
Alpha-1-Globulin: 4.6 % (ref 2.9–4.9)
Alpha-2-Globulin: 11.8 % (ref 7.1–11.8)
Beta 2: 15.9 % — ABNORMAL HIGH (ref 3.2–6.5)
Beta Globulin: 6 % (ref 4.7–7.2)
Total Protein, Serum Electrophoresis: 6.7 g/dL (ref 6.0–8.3)

## 2011-12-18 LAB — COMPREHENSIVE METABOLIC PANEL
Alkaline Phosphatase: 55 U/L (ref 39–117)
CO2: 24 mEq/L (ref 19–32)
Creatinine, Ser: 0.95 mg/dL (ref 0.50–1.35)
Glucose, Bld: 92 mg/dL (ref 70–99)
Sodium: 140 mEq/L (ref 135–145)
Total Bilirubin: 0.4 mg/dL (ref 0.3–1.2)

## 2011-12-18 LAB — KAPPA/LAMBDA LIGHT CHAINS
Kappa:Lambda Ratio: 7.32 — ABNORMAL HIGH (ref 0.26–1.65)
Lambda Free Lght Chn: 0.77 mg/dL (ref 0.57–2.63)

## 2011-12-18 NOTE — Telephone Encounter (Signed)
Patient wanted to confirm we had contacted [facility] to confirm his Revlimid cycle is 14 days on & 14 days off. Also asking when next Zometa is due and for copy of his protein study labs be faxed to his home [phone] Confirmed Revlimid dosing with [facility] and Zometa is q 3 months per Dr. Truett Perna. Labs faxed as requested along with note about Zometa.

## 2011-12-23 NOTE — Progress Notes (Signed)
Encounter addended by: Tessa Lerner, RN on: 12/23/2011 12:11 PM<BR>     Documentation filed: Charges VN

## 2011-12-25 ENCOUNTER — Encounter: Payer: Self-pay | Admitting: Radiation Oncology

## 2011-12-25 ENCOUNTER — Ambulatory Visit
Admission: RE | Admit: 2011-12-25 | Discharge: 2011-12-25 | Disposition: A | Discharge status: Home / Self Care | Payer: BC Managed Care – PPO | Source: Ambulatory Visit | Attending: Radiation Oncology | Admitting: Radiation Oncology

## 2011-12-25 VITALS — BP 165/97 | HR 101 | Temp 99.0°F | Resp 18 | Wt 219.8 lb

## 2011-12-25 DIAGNOSIS — C9000 Multiple myeloma not having achieved remission: Secondary | ICD-10-CM

## 2011-12-25 NOTE — Progress Notes (Signed)
  Radiation Oncology         [phone] ________________________________  Name: Chad [name] MRN: [id number]  Date: 12/25/2011  DOB: 1955-08-27  Follow-Up Visit Note  CC: Michele Mcalpine, MD  Ladene Artist, MD  Diagnosis:   Multiple myeloma  Interval Since Last Radiation:  1 months  Narrative:  The patient returns today for routine follow-up.  His pain in the right shoulder and scapula area is much improved as well as his mobility. He rates his pain on the scale of 1-10 is being a 2. Patient is on Velcade and seems to be tolerating this well. Patient will undergo re- imaging later this month ordered by Dr. Truett Perna.                              ALLERGIES:  is allergic to lisinopril; penicillins; and valsartan.  Meds: Current Outpatient Prescriptions  Medication Sig Dispense Refill  . Bortezomib (VELCADE IV) Inject into the skin.      . Zoledronic Acid (ZOMETA IV) Inject into the vein.      Marland Kitchen acetaminophen (TYLENOL) 325 MG tablet Take 650 mg by mouth every 6 (six) hours as needed.      Marland Kitchen acyclovir (ZOVIRAX) 400 MG tablet Take 1 tablet (400 mg total) by mouth daily.  90 tablet  3  . aspirin 81 MG tablet Take 81 mg by mouth daily.        Marland Kitchen dexamethasone (DECADRON) 4 MG tablet Take 40 mg by mouth Once a week.      Marland Kitchen HYDROcodone-acetaminophen (VICODIN) 5-500 MG per tablet Take 1 tablet by mouth every 4 (four) hours as needed. 1 tab po q4h prn pain Qty 30 1 refill      . lenalidomide (REVLIMID) 25 MG capsule Take 1 capsule (25 mg total) by mouth daily. 14 days on 14 days off.  14 capsule  0  . prochlorperazine (COMPAZINE) 10 MG tablet Take 1 tablet (10 mg total) by mouth every 6 (six) hours as needed.  30 tablet  3    Physical Findings: The patient is in no acute distress. Patient is alert and oriented.  weight is 219 lb 12.8 oz (99.701 kg). His oral temperature is 99 F (37.2 C). His blood pressure is 165/97 and his pulse is 101. His respiration is 18. Marland Kitchen  No palpable cervical or  subclavicular or axillary adenopathy. There is no appreciable radiation changes in the right shoulder or scapular area. Palpation along this area reveals no point tenderness. Patient does have good range of movement in his right arm and shoulder area at this time. The lungs are clear. The heart has a regular rhythm and rate.  Lab Findings: Lab Results  Component Value Date   WBC 3.1* 12/16/2011   HGB 12.7* 12/16/2011   HCT 37.0* 12/16/2011   MCV 93.1 12/16/2011   PLT 166 12/16/2011    @LASTCHEM @  Radiographic Findings: No results found.  Impression:  The patient is recovering from the effects of radiation.  His pain in right shoulder and mobility is  much improved.  Plan:  When necessary followup.  The patient will be under close followup with Dr. Truett Perna in light of his ongoing chemotherapy.  _____________________________________   Billie Lade, PhD, MD

## 2011-12-25 NOTE — Progress Notes (Signed)
HERE TODAY FOR FU OF MULTIPLE MYELOMA WITH TX TO RIGHT SHOULDER.  HAS PAIN WITH MOVEMENT IN SHOULDER, RATES 2/10.  IS NOT TAKING ANY PAIN MED FOR THIS.  CURRENTLY TAKING CHEMO, REVALMID AND VELCADE.  TAKES DECADRON 40 MG WEEKLY.

## 2011-12-26 ENCOUNTER — Other Ambulatory Visit: Payer: Self-pay | Admitting: Oncology

## 2011-12-26 ENCOUNTER — Telehealth: Payer: Self-pay | Admitting: *Deleted

## 2011-12-26 NOTE — Telephone Encounter (Signed)
Per Dr. Lily Lovings back for fever,chills or dyspnea. Plan on Velcade even with cold symptoms. If necessary can order CXR tomorrow.

## 2011-12-26 NOTE — Telephone Encounter (Signed)
Has URI--congestion and cough that is non-productive. No fever or dyspnea. Due Velcade tomorrow.Marland Kitchen

## 2011-12-27 ENCOUNTER — Other Ambulatory Visit (HOSPITAL_BASED_OUTPATIENT_CLINIC_OR_DEPARTMENT_OTHER): Payer: BC Managed Care – PPO | Admitting: Lab

## 2011-12-27 ENCOUNTER — Ambulatory Visit (HOSPITAL_BASED_OUTPATIENT_CLINIC_OR_DEPARTMENT_OTHER): Payer: BC Managed Care – PPO

## 2011-12-27 VITALS — BP 149/83 | HR 97 | Temp 98.1°F

## 2011-12-27 DIAGNOSIS — Z5112 Encounter for antineoplastic immunotherapy: Secondary | ICD-10-CM

## 2011-12-27 DIAGNOSIS — C9000 Multiple myeloma not having achieved remission: Secondary | ICD-10-CM

## 2011-12-27 LAB — CBC WITH DIFFERENTIAL/PLATELET
Basophils Absolute: 0 10*3/uL (ref 0.0–0.1)
Eosinophils Absolute: 0.3 10*3/uL (ref 0.0–0.5)
HGB: 13.1 g/dL (ref 13.0–17.1)
LYMPH%: 7.8 % — ABNORMAL LOW (ref 14.0–49.0)
MCV: 93.9 fL (ref 79.3–98.0)
MONO%: 7.9 % (ref 0.0–14.0)
NEUT#: 4.7 10*3/uL (ref 1.5–6.5)
NEUT%: 78.6 % — ABNORMAL HIGH (ref 39.0–75.0)
Platelets: 150 10*3/uL (ref 140–400)

## 2011-12-27 MED ORDER — BORTEZOMIB CHEMO SQ INJECTION 3.5 MG (2.5MG/ML)
1.3000 mg/m2 | Freq: Once | INTRAMUSCULAR | Status: AC
  Administered 2011-12-27: 3 mg via SUBCUTANEOUS
  Filled 2011-12-27: qty 3

## 2011-12-27 MED ORDER — ONDANSETRON HCL 8 MG PO TABS
8.0000 mg | ORAL_TABLET | Freq: Once | ORAL | Status: AC
  Administered 2011-12-27: 8 mg via ORAL

## 2011-12-27 NOTE — Patient Instructions (Signed)
[facility] Discharge Instructions for Patients Receiving Chemotherapy  Today you received the following chemotherapy agents:  Velcade  To help prevent nausea and vomiting after your treatment, we encourage you to take your nausea medication as ordered per MD.    If you develop nausea and vomiting that is not controlled by your nausea medication, call the clinic. If it is after clinic hours your family physician or the after hours number for the clinic or go to the Emergency Department.   BELOW ARE SYMPTOMS THAT SHOULD BE REPORTED IMMEDIATELY:  *FEVER GREATER THAN 100.5 F  *CHILLS WITH OR WITHOUT FEVER  NAUSEA AND VOMITING THAT IS NOT CONTROLLED WITH YOUR NAUSEA MEDICATION  *UNUSUAL SHORTNESS OF BREATH  *UNUSUAL BRUISING OR BLEEDING  TENDERNESS IN MOUTH AND THROAT WITH OR WITHOUT PRESENCE OF ULCERS  *URINARY PROBLEMS  *BOWEL PROBLEMS  UNUSUAL RASH Items with * indicate a potential emergency and should be followed up as soon as possible.   Please let the nurse know about any problems that you may have experienced. Feel free to call the clinic you have any questions or concerns. The clinic phone number is [phone].   I have been informed and understand all the instructions given to me. I know to contact the clinic, my physician, or go to the Emergency Department if any problems should occur. I do not have any questions at this time, but understand that I may call the clinic during office hours   should I have any questions or need assistance in obtaining follow up care.    __________________________________________  _____________  __________ Signature of Patient or Authorized Representative            Date                   Time    __________________________________________ Nurse's Signature    

## 2011-12-30 ENCOUNTER — Encounter: Payer: Self-pay | Admitting: Dietician

## 2011-12-30 NOTE — Progress Notes (Signed)
Brief Out-patient Oncology Nutrition Note  Reason: Positive Nutrition Risk Screen for Unintentional Weight Loss and Decreased Appetite  [name] is a 57 year old male patient of Dr. Truett Perna, diagnosed with multiple myeloma. Attempted to contact patient via telephone for positive nutrition risk. Left patient voice mail with RD contact information.   Wt Readings from Last 10 Encounters:  12/25/11 219 lb 12.8 oz (99.701 kg)  12/16/11 219 lb 12.8 oz (99.701 kg)  11/22/11 222 lb 3.2 oz (100.789 kg)  11/19/11 221 lb 12.8 oz (100.608 kg)  11/15/11 223 lb 14.4 oz (101.56 kg)  11/12/11 223 lb 1.6 oz (101.197 kg)  10/30/11 228 lb 4.8 oz (103.556 kg)  10/22/11 228 lb 4.8 oz (103.556 kg)  09/20/11 236 lb 14.4 oz (107.457 kg)  12/17/10 232 lb 9.6 oz (105.507 kg)   Adron Bene [phone]

## 2012-01-01 ENCOUNTER — Other Ambulatory Visit: Payer: Self-pay | Admitting: Oncology

## 2012-01-03 ENCOUNTER — Ambulatory Visit (HOSPITAL_BASED_OUTPATIENT_CLINIC_OR_DEPARTMENT_OTHER): Payer: BC Managed Care – PPO

## 2012-01-03 ENCOUNTER — Other Ambulatory Visit (HOSPITAL_BASED_OUTPATIENT_CLINIC_OR_DEPARTMENT_OTHER): Payer: BC Managed Care – PPO | Admitting: Lab

## 2012-01-03 VITALS — BP 162/94 | HR 80 | Temp 98.2°F

## 2012-01-03 DIAGNOSIS — C9000 Multiple myeloma not having achieved remission: Secondary | ICD-10-CM

## 2012-01-03 DIAGNOSIS — Z5112 Encounter for antineoplastic immunotherapy: Secondary | ICD-10-CM

## 2012-01-03 LAB — CBC WITH DIFFERENTIAL/PLATELET
Basophils Absolute: 0 10*3/uL (ref 0.0–0.1)
EOS%: 11.4 % — ABNORMAL HIGH (ref 0.0–7.0)
Eosinophils Absolute: 0.5 10*3/uL (ref 0.0–0.5)
HGB: 12.4 g/dL — ABNORMAL LOW (ref 13.0–17.1)
NEUT#: 2.4 10*3/uL (ref 1.5–6.5)
RBC: 3.99 10*6/uL — ABNORMAL LOW (ref 4.20–5.82)
RDW: 15.5 % — ABNORMAL HIGH (ref 11.0–14.6)
lymph#: 0.8 10*3/uL — ABNORMAL LOW (ref 0.9–3.3)

## 2012-01-03 MED ORDER — ONDANSETRON HCL 8 MG PO TABS
8.0000 mg | ORAL_TABLET | Freq: Once | ORAL | Status: AC
  Administered 2012-01-03: 8 mg via ORAL

## 2012-01-03 MED ORDER — BORTEZOMIB CHEMO SQ INJECTION 3.5 MG (2.5MG/ML)
1.3000 mg/m2 | Freq: Once | INTRAMUSCULAR | Status: AC
  Administered 2012-01-03: 3 mg via SUBCUTANEOUS
  Filled 2012-01-03: qty 3

## 2012-01-03 NOTE — Patient Instructions (Addendum)
[facility] Discharge Instructions for Patients Receiving Chemotherapy  Today you received the following chemotherapy agents Velcade To help prevent nausea and vomiting after your treatment, we encourage you to take your nausea medication Begin taking it at 7pm and take it as often as prescribed for the next 24-72 hours.   If you develop nausea and vomiting that is not controlled by your nausea medication, call the clinic. If it is after clinic hours your family physician or the after hours number for the clinic or go to the Emergency Department.   BELOW ARE SYMPTOMS THAT SHOULD BE REPORTED IMMEDIATELY:  *FEVER GREATER THAN 100.5 F  *CHILLS WITH OR WITHOUT FEVER  NAUSEA AND VOMITING THAT IS NOT CONTROLLED WITH YOUR NAUSEA MEDICATION  *UNUSUAL SHORTNESS OF BREATH  *UNUSUAL BRUISING OR BLEEDING  TENDERNESS IN MOUTH AND THROAT WITH OR WITHOUT PRESENCE OF ULCERS  *URINARY PROBLEMS  *BOWEL PROBLEMS  UNUSUAL RASH Items with * indicate a potential emergency and should be followed up as soon as possible.  One of the nurses will contact you 24 hours after your treatment. Please let the nurse know about any problems that you may have experienced. Feel free to call the clinic you have any questions or concerns. The clinic phone number is [phone].   I have been informed and understand all the instructions given to me. I know to contact the clinic, my physician, or go to the Emergency Department if any problems should occur. I do not have any questions at this time, but understand that I may call the clinic during office hours   should I have any questions or need assistance in obtaining follow up care.    __________________________________________  _____________  __________ Signature of Patient or Authorized Representative            Date                   Time    __________________________________________ Nurse's Signature    

## 2012-01-08 ENCOUNTER — Other Ambulatory Visit: Payer: Self-pay | Admitting: Oncology

## 2012-01-09 ENCOUNTER — Other Ambulatory Visit: Payer: Self-pay | Admitting: *Deleted

## 2012-01-09 NOTE — Telephone Encounter (Signed)
Received refill request X 2 for Revlimid. Called and made pharmacy aware that restaging labs being done on 6/28 to determine if current treatment will continue. Will refill based upon results.

## 2012-01-10 ENCOUNTER — Other Ambulatory Visit (HOSPITAL_BASED_OUTPATIENT_CLINIC_OR_DEPARTMENT_OTHER): Payer: BC Managed Care – PPO | Admitting: Lab

## 2012-01-10 ENCOUNTER — Ambulatory Visit (HOSPITAL_BASED_OUTPATIENT_CLINIC_OR_DEPARTMENT_OTHER): Payer: BC Managed Care – PPO

## 2012-01-10 VITALS — BP 135/82 | HR 88 | Temp 97.1°F

## 2012-01-10 DIAGNOSIS — C9000 Multiple myeloma not having achieved remission: Secondary | ICD-10-CM

## 2012-01-10 DIAGNOSIS — Z5112 Encounter for antineoplastic immunotherapy: Secondary | ICD-10-CM

## 2012-01-10 LAB — CBC WITH DIFFERENTIAL/PLATELET
Basophils Absolute: 0.1 10*3/uL (ref 0.0–0.1)
Eosinophils Absolute: 0.1 10*3/uL (ref 0.0–0.5)
HGB: 12.7 g/dL — ABNORMAL LOW (ref 13.0–17.1)
MCV: 92.4 fL (ref 79.3–98.0)
MONO#: 0.3 10*3/uL (ref 0.1–0.9)
MONO%: 6 % (ref 0.0–14.0)
NEUT#: 2.7 10*3/uL (ref 1.5–6.5)
RBC: 4.01 10*6/uL — ABNORMAL LOW (ref 4.20–5.82)
RDW: 17.1 % — ABNORMAL HIGH (ref 11.0–14.6)
WBC: 4.2 10*3/uL (ref 4.0–10.3)

## 2012-01-10 MED ORDER — ONDANSETRON HCL 8 MG PO TABS
8.0000 mg | ORAL_TABLET | Freq: Once | ORAL | Status: AC
  Administered 2012-01-10: 8 mg via ORAL

## 2012-01-10 MED ORDER — BORTEZOMIB CHEMO SQ INJECTION 3.5 MG (2.5MG/ML)
1.3000 mg/m2 | Freq: Once | INTRAMUSCULAR | Status: AC
  Administered 2012-01-10: 3 mg via SUBCUTANEOUS
  Filled 2012-01-10: qty 3

## 2012-01-13 ENCOUNTER — Encounter: Payer: Self-pay | Admitting: Oncology

## 2012-01-13 NOTE — Progress Notes (Signed)
I fax information to the Leukemia and Lymphoma co-pay assistance program for the patient,there # [phone] and fax #[phone].

## 2012-01-14 ENCOUNTER — Telehealth: Payer: Self-pay | Admitting: *Deleted

## 2012-01-14 LAB — KAPPA/LAMBDA LIGHT CHAINS: Lambda Free Lght Chn: 0.6 mg/dL (ref 0.57–2.63)

## 2012-01-14 LAB — BASIC METABOLIC PANEL
BUN: 9 mg/dL (ref 6–23)
CO2: 22 mEq/L (ref 19–32)
Chloride: 104 mEq/L (ref 96–112)
Glucose, Bld: 153 mg/dL — ABNORMAL HIGH (ref 70–99)
Potassium: 3.8 mEq/L (ref 3.5–5.3)
Sodium: 138 mEq/L (ref 135–145)

## 2012-01-14 LAB — PROTEIN ELECTROPHORESIS, SERUM
Alpha-2-Globulin: 13.6 % — ABNORMAL HIGH (ref 7.1–11.8)
Beta 2: 10.4 % — ABNORMAL HIGH (ref 3.2–6.5)
Beta Globulin: 6.8 % (ref 4.7–7.2)
Gamma Globulin: 4.9 % — ABNORMAL LOW (ref 11.1–18.8)
M-Spike, %: 0.39 g/dL
Total Protein, Serum Electrophoresis: 6.4 g/dL (ref 6.0–8.3)

## 2012-01-14 NOTE — Telephone Encounter (Signed)
Left VM for patient that MD wants to hold on further Revlimid since protein studies are improved. Needs to see Dr. Lance Bosch asap to consider stem cell therapy. Patient had called and left message 7/2 that he needed refill (still has #7 capsules on hand from when cycle was changed). Called [facility] and made appointment for 02/04/12 at 2:30 pm with Dr. Radene Ou and left this information on his voice mail. Dr. Truett Perna will be emailing him today. Copy of labs mailed to patient's home. Will have medical records fax labs/office notes to Dr. Lance Bosch.

## 2012-01-15 ENCOUNTER — Telehealth: Payer: Self-pay | Admitting: *Deleted

## 2012-01-15 ENCOUNTER — Other Ambulatory Visit: Payer: Self-pay | Admitting: *Deleted

## 2012-01-15 DIAGNOSIS — C9000 Multiple myeloma not having achieved remission: Secondary | ICD-10-CM

## 2012-01-15 MED ORDER — LENALIDOMIDE 25 MG PO CAPS: 25.0000 mg | ORAL_CAPSULE | Freq: Every day | ORAL | Status: DC

## 2012-01-15 NOTE — Telephone Encounter (Signed)
Dr. Lance Bosch has requested one more cycle of Revlimid/Velcade prior to his visit on 7/23. Patient notified of plan and agrees. Will start next cycle on 01/20/12 per Dr. Truett Perna.

## 2012-01-17 ENCOUNTER — Telehealth: Payer: Self-pay | Admitting: Oncology

## 2012-01-17 NOTE — Telephone Encounter (Signed)
Added tx to 7/8 lb/fu per 7/3 pof. Start time has not changed.

## 2012-01-20 ENCOUNTER — Ambulatory Visit (HOSPITAL_BASED_OUTPATIENT_CLINIC_OR_DEPARTMENT_OTHER): Payer: BC Managed Care – PPO

## 2012-01-20 ENCOUNTER — Ambulatory Visit (HOSPITAL_BASED_OUTPATIENT_CLINIC_OR_DEPARTMENT_OTHER): Payer: BC Managed Care – PPO | Admitting: Oncology

## 2012-01-20 ENCOUNTER — Telehealth: Payer: Self-pay | Admitting: *Deleted

## 2012-01-20 ENCOUNTER — Telehealth: Payer: Self-pay | Admitting: Oncology

## 2012-01-20 ENCOUNTER — Other Ambulatory Visit (HOSPITAL_BASED_OUTPATIENT_CLINIC_OR_DEPARTMENT_OTHER): Payer: BC Managed Care – PPO

## 2012-01-20 VITALS — BP 152/91 | HR 82 | Temp 98.1°F

## 2012-01-20 VITALS — BP 154/90 | HR 80 | Temp 98.6°F | Ht 72.0 in | Wt 220.6 lb

## 2012-01-20 DIAGNOSIS — C9000 Multiple myeloma not having achieved remission: Secondary | ICD-10-CM

## 2012-01-20 DIAGNOSIS — Z5112 Encounter for antineoplastic immunotherapy: Secondary | ICD-10-CM

## 2012-01-20 LAB — CBC WITH DIFFERENTIAL/PLATELET
BASO%: 1.9 % (ref 0.0–2.0)
Eosinophils Absolute: 0.1 10*3/uL (ref 0.0–0.5)
HCT: 37.4 % — ABNORMAL LOW (ref 38.4–49.9)
LYMPH%: 20.7 % (ref 14.0–49.0)
MCHC: 35.3 g/dL (ref 32.0–36.0)
MCV: 89.5 fL (ref 79.3–98.0)
MONO#: 0.2 10*3/uL (ref 0.1–0.9)
MONO%: 5.9 % (ref 0.0–14.0)
NEUT%: 69.3 % (ref 39.0–75.0)
Platelets: 167 10*3/uL (ref 140–400)
RBC: 4.18 10*6/uL — ABNORMAL LOW (ref 4.20–5.82)
WBC: 3.7 10*3/uL — ABNORMAL LOW (ref 4.0–10.3)
nRBC: 0 % (ref 0–0)

## 2012-01-20 MED ORDER — BORTEZOMIB CHEMO SQ INJECTION 3.5 MG (2.5MG/ML)
1.3000 mg/m2 | Freq: Once | INTRAMUSCULAR | Status: AC
  Administered 2012-01-20: 3 mg via SUBCUTANEOUS
  Filled 2012-01-20: qty 3

## 2012-01-20 MED ORDER — ONDANSETRON HCL 8 MG PO TABS
8.0000 mg | ORAL_TABLET | Freq: Once | ORAL | Status: AC
  Administered 2012-01-20: 8 mg via ORAL

## 2012-01-20 NOTE — Progress Notes (Signed)
[facility]    OFFICE PROGRESS NOTE   INTERVAL HISTORY:   He returns as scheduled. He has completed 2 cycles of Revlimid/Velcade/Decadron. He reports tolerating the chemotherapy well. Mild neuropathy symptoms lasting for one day  occurring several days after Velcade. He reports mild discomfort in the right shoulder. Chad [name] had a recent upper respiratory infection with a cough. This resolved over 2 weeks.  Objective:  Vital signs in last 24 hours:  Blood pressure 154/90, pulse 80, temperature 98.6 F (37 C), temperature source Oral, height 6' (1.829 m), weight 220 lb 9.6 oz (100.064 kg).    HEENT: No thrush or ulcers Resp: Lungs clear bilaterally Cardio: Regular rate and rhythm GI: No hepatosplenomegaly Vascular: No leg edema Muscle skeletal: Mild tenderness at the lateral and superior aspect of the right scapula near the shoulder joint.  Lab Results:  Lab Results  Component Value Date   WBC 3.7* 01/20/2012   HGB 13.2 01/20/2012   HCT 37.4* 01/20/2012   MCV 89.5 01/20/2012   PLT 167 01/20/2012   2.6  Kappa free light chains 3.07 on 01/10/2012 Serum M spike 0.39 on 01/10/2012  Medications: I have reviewed the patient's current medications.  Assessment/Plan: 1. Multiple myeloma-status post high-dose chemotherapy with autologous stem cell support in November of 2004. The sero monoclonal protein was slowly rising and a restaging bone marrow biopsy in November 2009 confirmed 10 to 20% plasma cells. The serum light chains were elevated 07/25/2008. He completed 12 cycles of salvage therapy with Revlimid/Decadron with the last cycle initiated 06/16/2009. On 07/13/2009 the serum free light chains were normal and a serum protein electrophoresis/immunofixation revealed no monoclonal protein. Elevated serum M spike and serum free kappa light chains March 2013. He began cycle 1 of Revlimid 25 mg daily x21 days followed by a 7 day break on 09/27/2011. He began a second cycle of  salvage Revlimid beginning on 10/25/2011. -The serum M spike was stable and the serum free kappa light chains were slightly more elevated on 11/19/2011. -He began another cycle of Revlimid/Decadron on 11/22/2011. Velcade was added beginning on 11/29/2011. He has completed 2 cycles of Revlimid/Velcade/Decadron and the serum M spike/serum free kappa light chains are improved. 2. A serum immunofixation confirmed a monoclonal protein and the serum Kappa and Lambda light chains were mildly elevated in June of 2012.  3. History of elevated liver enzymes. 4. Right lung pneumonia June, 2009 -- resolved after treatment with Bactrim and azithromycin. 5. 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  6. History of hypokalemia -he is no longer taking a potassium supplement 7. Pneumonia 01/06/2009 -- requiring hospitalization 01/10/2011 -- status post broad spectrum antibiotic therapy with clinical resolution. 8. Upper respiratory infection in February, 2011 -- likely a viral URI.  9. Right shoulder/upper arm pain. Plain x-ray of the right shoulder/humerus on 10/16/2011 showed 2 small lytic lesions at the proximal humeral shaft suspicious for myeloma involvement. MRI of the right shoulder 10/27/2011 confirmed multiple myelomatous lesions involving the proximal right humerus, scapula, and ribs. A large destructive lesion was noted in the scapular body with an associated soft tissue component. He completed palliative radiation to the right scapula on 11/19/2011 with partial improvement in the shoulder discomfort. 10. "hemorrhoid "discomfort and urinary hesitancy 11/15/2011-he appeared to have an ulcer or fissure at the anal verge. His symptoms have resolved.   Disposition:  He has completed 2 cycles of Revlimid/Velcade/Decadron after no significant  improvement in the serum M spike/light chains following single agent Revlimid therapy. The serum free  kappa light chains and serum M spike are improved. I contacted Dr. Lance Bosch in the plan is to complete a third cycle of Revlimid/Velcade/Decadron. He will then be evaluated at [facility] to consider a second stem cell procedure.  Mr. Eley will be out of town on vacation next week. He will complete 2 weeks of Revlimid beginning today. Velcade is scheduled for today, 01/24/2012, and 02/03/2012. He will return for an office visit on 02/10/2012. He is maintained on Zometa every 3 months.   Thornton Papas, MD  01/20/2012  9:30 AM

## 2012-01-20 NOTE — Patient Instructions (Signed)
[facility] Discharge Instructions for Patients Receiving Chemotherapy  Today you received the following chemotherapy agents Velcade.  To help prevent nausea and vomiting after your treatment, we encourage you to take your nausea medication as prescribed.   If you develop nausea and vomiting that is not controlled by your nausea medication, call the clinic. If it is after clinic hours your family physician or the after hours number for the clinic or go to the Emergency Department.   BELOW ARE SYMPTOMS THAT SHOULD BE REPORTED IMMEDIATELY:  *FEVER GREATER THAN 100.5 F  *CHILLS WITH OR WITHOUT FEVER  NAUSEA AND VOMITING THAT IS NOT CONTROLLED WITH YOUR NAUSEA MEDICATION  *UNUSUAL SHORTNESS OF BREATH  *UNUSUAL BRUISING OR BLEEDING  TENDERNESS IN MOUTH AND THROAT WITH OR WITHOUT PRESENCE OF ULCERS  *URINARY PROBLEMS  *BOWEL PROBLEMS  UNUSUAL RASH Items with * indicate a potential emergency and should be followed up as soon as possible.  One of the nurses will contact you 24 hours after your treatment. Please let the nurse know about any problems that you may have experienced. Feel free to call the clinic you have any questions or concerns. The clinic phone number is [phone].   I have been informed and understand all the instructions given to me. I know to contact the clinic, my physician, or go to the Emergency Department if any problems should occur. I do not have any questions at this time, but understand that I may call the clinic during office hours   should I have any questions or need assistance in obtaining follow up care.    __________________________________________  _____________  __________ Signature of Patient or Authorized Representative            Date                   Time    __________________________________________ Nurse's Signature    

## 2012-01-20 NOTE — Telephone Encounter (Signed)
appts made and mw to add tx and print for pt,pt aware  Aom

## 2012-01-20 NOTE — Telephone Encounter (Signed)
Per staff message I have scheudled appts.  JMW  

## 2012-01-24 ENCOUNTER — Ambulatory Visit (HOSPITAL_BASED_OUTPATIENT_CLINIC_OR_DEPARTMENT_OTHER): Payer: BC Managed Care – PPO

## 2012-01-24 ENCOUNTER — Other Ambulatory Visit (HOSPITAL_BASED_OUTPATIENT_CLINIC_OR_DEPARTMENT_OTHER): Payer: BC Managed Care – PPO | Admitting: Lab

## 2012-01-24 VITALS — BP 154/90 | HR 73 | Temp 97.5°F

## 2012-01-24 DIAGNOSIS — C9000 Multiple myeloma not having achieved remission: Secondary | ICD-10-CM

## 2012-01-24 DIAGNOSIS — Z5112 Encounter for antineoplastic immunotherapy: Secondary | ICD-10-CM

## 2012-01-24 LAB — CBC WITH DIFFERENTIAL/PLATELET
BASO%: 0.4 % (ref 0.0–2.0)
Eosinophils Absolute: 0.2 10*3/uL (ref 0.0–0.5)
LYMPH%: 20.1 % (ref 14.0–49.0)
MCHC: 35.3 g/dL (ref 32.0–36.0)
MONO#: 0.3 10*3/uL (ref 0.1–0.9)
NEUT#: 3.6 10*3/uL (ref 1.5–6.5)
Platelets: 155 10*3/uL (ref 140–400)
RBC: 3.97 10*6/uL — ABNORMAL LOW (ref 4.20–5.82)
RDW: 15.9 % — ABNORMAL HIGH (ref 11.0–14.6)
WBC: 5.2 10*3/uL (ref 4.0–10.3)
lymph#: 1 10*3/uL (ref 0.9–3.3)
nRBC: 0 % (ref 0–0)

## 2012-01-24 MED ORDER — ONDANSETRON HCL 8 MG PO TABS
8.0000 mg | ORAL_TABLET | Freq: Once | ORAL | Status: AC
  Administered 2012-01-24: 8 mg via ORAL

## 2012-01-24 MED ORDER — BORTEZOMIB CHEMO SQ INJECTION 3.5 MG (2.5MG/ML)
1.3000 mg/m2 | Freq: Once | INTRAMUSCULAR | Status: AC
  Administered 2012-01-24: 3 mg via SUBCUTANEOUS
  Filled 2012-01-24: qty 3

## 2012-02-03 ENCOUNTER — Ambulatory Visit: Payer: BC Managed Care – PPO | Admitting: Oncology

## 2012-02-03 ENCOUNTER — Other Ambulatory Visit (HOSPITAL_BASED_OUTPATIENT_CLINIC_OR_DEPARTMENT_OTHER): Payer: BC Managed Care – PPO | Admitting: Lab

## 2012-02-03 ENCOUNTER — Telehealth: Payer: Self-pay | Admitting: *Deleted

## 2012-02-03 ENCOUNTER — Other Ambulatory Visit: Payer: BC Managed Care – PPO | Admitting: Lab

## 2012-02-03 ENCOUNTER — Ambulatory Visit (HOSPITAL_BASED_OUTPATIENT_CLINIC_OR_DEPARTMENT_OTHER): Payer: BC Managed Care – PPO

## 2012-02-03 VITALS — BP 152/87 | HR 78 | Temp 99.0°F

## 2012-02-03 DIAGNOSIS — Z5112 Encounter for antineoplastic immunotherapy: Secondary | ICD-10-CM

## 2012-02-03 DIAGNOSIS — C9000 Multiple myeloma not having achieved remission: Secondary | ICD-10-CM

## 2012-02-03 LAB — CBC WITH DIFFERENTIAL/PLATELET
BASO%: 0.4 % (ref 0.0–2.0)
Basophils Absolute: 0 10*3/uL (ref 0.0–0.1)
EOS%: 12.2 % — ABNORMAL HIGH (ref 0.0–7.0)
HCT: 37.8 % — ABNORMAL LOW (ref 38.4–49.9)
HGB: 13.3 g/dL (ref 13.0–17.1)
MCH: 31.6 pg (ref 27.2–33.4)
MONO#: 0.4 10*3/uL (ref 0.1–0.9)
RDW: 16.2 % — ABNORMAL HIGH (ref 11.0–14.6)
WBC: 5 10*3/uL (ref 4.0–10.3)
lymph#: 0.9 10*3/uL (ref 0.9–3.3)

## 2012-02-03 MED ORDER — ONDANSETRON HCL 8 MG PO TABS
8.0000 mg | ORAL_TABLET | Freq: Once | ORAL | Status: AC
  Administered 2012-02-03: 8 mg via ORAL

## 2012-02-03 MED ORDER — BORTEZOMIB CHEMO SQ INJECTION 3.5 MG (2.5MG/ML)
1.3000 mg/m2 | Freq: Once | INTRAMUSCULAR | Status: AC
  Administered 2012-02-03: 3 mg via SUBCUTANEOUS
  Filled 2012-02-03: qty 3

## 2012-02-03 NOTE — Patient Instructions (Addendum)
[facility] Discharge Instructions for Patients Receiving Chemotherapy  Today you received the following chemotherapy agents: Velcade  To help prevent nausea and vomiting after your treatment, we encourage you to take your nausea medication.  Take it as often as prescribed for the next 48-72 hours.   If you develop nausea and vomiting that is not controlled by your nausea medication, call the clinic. If it is after clinic hours your family physician or the after hours number for the clinic or go to the Emergency Department.   BELOW ARE SYMPTOMS THAT SHOULD BE REPORTED IMMEDIATELY:  *FEVER GREATER THAN 100.5 F  *CHILLS WITH OR WITHOUT FEVER  NAUSEA AND VOMITING THAT IS NOT CONTROLLED WITH YOUR NAUSEA MEDICATION  *UNUSUAL SHORTNESS OF BREATH  *UNUSUAL BRUISING OR BLEEDING  TENDERNESS IN MOUTH AND THROAT WITH OR WITHOUT PRESENCE OF ULCERS  *URINARY PROBLEMS  *BOWEL PROBLEMS  UNUSUAL RASH Items with * indicate a potential emergency and should be followed up as soon as possible.  Feel free to call the clinic you have any questions or concerns. The clinic phone number is [phone].   I have been informed and understand all the instructions given to me. I know to contact the clinic, my physician, or go to the Emergency Department if any problems should occur. I do not have any questions at this time, but understand that I may call the clinic during office hours   should I have any questions or need assistance in obtaining follow up care.    __________________________________________  _____________  __________ Signature of Patient or Authorized Representative            Date                   Time    __________________________________________ Nurse's Signature     

## 2012-02-03 NOTE — Telephone Encounter (Signed)
Patient left VM that [employer] is telling him they never received the completed form from our office. Asking how to get another form sent to office to be sent to them? Per [employer] note, Carolynne Edouard in managed care faxed the form on 01/13/12. Left VM for managed care to follow up on this.

## 2012-02-04 ENCOUNTER — Encounter: Payer: Self-pay | Admitting: Oncology

## 2012-02-04 NOTE — Progress Notes (Signed)
I did re-fax the information to the leukemia and lymphoma society ,and I did speak with with sue and I told her that I did fax it on 01/13/12 she said that she could not find it.I told her that I will call back on Thursday to check and see if they have receive the fax,she said it takes two to three days to up load in there system.

## 2012-02-09 ENCOUNTER — Other Ambulatory Visit: Payer: Self-pay | Admitting: Oncology

## 2012-02-10 ENCOUNTER — Telehealth: Payer: Self-pay | Admitting: Oncology

## 2012-02-10 ENCOUNTER — Other Ambulatory Visit (HOSPITAL_BASED_OUTPATIENT_CLINIC_OR_DEPARTMENT_OTHER): Payer: BC Managed Care – PPO

## 2012-02-10 ENCOUNTER — Ambulatory Visit: Payer: BC Managed Care – PPO

## 2012-02-10 ENCOUNTER — Other Ambulatory Visit: Payer: BC Managed Care – PPO | Admitting: Lab

## 2012-02-10 ENCOUNTER — Ambulatory Visit: Payer: BC Managed Care – PPO | Admitting: Oncology

## 2012-02-10 ENCOUNTER — Ambulatory Visit (HOSPITAL_BASED_OUTPATIENT_CLINIC_OR_DEPARTMENT_OTHER): Payer: BC Managed Care – PPO | Admitting: Oncology

## 2012-02-10 VITALS — BP 137/87 | HR 78 | Temp 98.5°F | Ht 72.0 in | Wt 219.9 lb

## 2012-02-10 DIAGNOSIS — M25519 Pain in unspecified shoulder: Secondary | ICD-10-CM

## 2012-02-10 DIAGNOSIS — C9000 Multiple myeloma not having achieved remission: Secondary | ICD-10-CM

## 2012-02-10 DIAGNOSIS — M79609 Pain in unspecified limb: Secondary | ICD-10-CM

## 2012-02-10 LAB — CBC WITH DIFFERENTIAL/PLATELET
Basophils Absolute: 0.1 10*3/uL (ref 0.0–0.1)
Eosinophils Absolute: 0.2 10*3/uL (ref 0.0–0.5)
HGB: 12.4 g/dL — ABNORMAL LOW (ref 13.0–17.1)
MONO#: 0.3 10*3/uL (ref 0.1–0.9)
NEUT#: 1.9 10*3/uL (ref 1.5–6.5)
Platelets: 150 10*3/uL (ref 140–400)
RBC: 3.94 10*6/uL — ABNORMAL LOW (ref 4.20–5.82)
RDW: 16.2 % — ABNORMAL HIGH (ref 11.0–14.6)
WBC: 3.2 10*3/uL — ABNORMAL LOW (ref 4.0–10.3)
nRBC: 0 % (ref 0–0)

## 2012-02-10 NOTE — Progress Notes (Signed)
Faxed refill request for Revlimid back to [facility] with note: Tx D/C'd-proceed to transplant.

## 2012-02-10 NOTE — Telephone Encounter (Signed)
appts made and printed for pt aom °

## 2012-02-10 NOTE — Progress Notes (Signed)
[facility]    OFFICE PROGRESS NOTE   INTERVAL HISTORY:   Chad [name] returns as scheduled. Chad [name] continues to have intermittent discomfort at the right shoulder. No other complaint. Chad [name] completed another cycle of Velcade/Revlimid beginning on 01/20/2012. Chad [name] saw Dr.Shea last week and is scheduled to undergo a pretransplant evaluation later this week. Chad [name] reports Dr.Shea would like for him to continue weekly Decadron.  Objective:  Vital signs in last 24 hours:  Blood pressure 137/87, pulse 78, temperature 98.5 F (36.9 C), temperature source Oral, height 6' (1.829 m), weight 219 lb 14.4 oz (99.746 kg).    HEENT: No thrush or ulcers Resp: Lungs clear bilaterally Cardio: Regular rate and rhythm GI: No hepatosplenomegaly, nontender Vascular: No leg edema   Lab Results:  Lab Results  Component Value Date   WBC 3.2* 02/10/2012   HGB 12.4* 02/10/2012   HCT 35.3* 02/10/2012   MCV 89.6 02/10/2012   PLT 150 02/10/2012   ANC 1.9    Medications: I have reviewed the patient's current medications.  Assessment/Plan: 1. Multiple myeloma-status post high-dose chemotherapy with autologous stem cell support in November of 2004. The sero monoclonal protein was slowly rising and a restaging bone marrow biopsy in November 2009 confirmed 10 to 20% plasma cells. The serum light chains were elevated 07/25/2008. Chad [name] completed 12 cycles of salvage therapy with Revlimid/Decadron with the last cycle initiated 06/16/2009. On 07/13/2009 the serum free light chains were normal and a serum protein electrophoresis/immunofixation revealed no monoclonal protein. Elevated serum M spike and serum free kappa light chains March 2013. Chad [name] began cycle 1 of Revlimid 25 mg daily x21 days followed by a 7 day break on 09/27/2011. Chad [name] began a second cycle of salvage Revlimid beginning on 10/25/2011. -The serum M spike was stable and the serum free kappa light chains were slightly more elevated on 11/19/2011. -Chad [name] began  another cycle of Revlimid/Decadron on 11/22/2011. Velcade was added beginning on 11/29/2011. Chad [name] has completed 3 cycles of Revlimid/Velcade/Decadron and the serum M spike/serum free kappa light chains are improved. The last cycle was initiated on 01/20/2012. 2. A serum immunofixation confirmed a monoclonal protein and the serum Kappa and Lambda light chains were mildly elevated in June of 2012.  3. History of elevated liver enzymes. 4. Right lung pneumonia June, 2009 -- resolved after treatment with Bactrim and azithromycin. 5. 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  6. History of hypokalemia -Chad [name] is no longer taking a potassium supplement 7. Pneumonia 01/06/2009 -- requiring hospitalization 01/10/2011 -- status post broad spectrum antibiotic therapy with clinical resolution. 8. Upper respiratory infection in February, 2011 -- likely a viral URI.  9. Right shoulder/upper arm pain. Plain x-ray of the right shoulder/humerus on 10/16/2011 showed 2 small lytic lesions at the proximal humeral shaft suspicious for myeloma involvement. MRI of the right shoulder 10/27/2011 confirmed multiple myelomatous lesions involving the proximal right humerus, scapula, and ribs. A large destructive lesion was noted in the scapular body with an associated soft tissue component. Chad [name] completed palliative radiation to the right scapula on 11/19/2011 with partial improvement in the shoulder discomfort. 10. "hemorrhoid "discomfort and urinary hesitancy 11/15/2011-Chad [name] appeared to have an ulcer or fissure at the anal verge. His symptoms have resolved.    Disposition:  Chad [name] appears stable. We will followup on records from Dr.Shea. Chad [name] is scheduled to undergo a pretransplant evaluation at [facility] later this week. Chad [name] will return for [facility] to  begin conditioning chemotherapy next week. Chad [name] last received Zometa on 11/29/2011. We will ask [facility] to administer Zometa when Chad [name]  is there next week.  Chad [name] plans to undergo  stem cell therapy in September. Chad [name] will return for an office visit here in October. We will see him sooner as needed.   Thornton Papas, MD  02/10/2012  4:26 PM

## 2012-02-11 ENCOUNTER — Telehealth: Payer: Self-pay | Admitting: *Deleted

## 2012-02-11 NOTE — Telephone Encounter (Signed)
Chad [name] with Dr. Lance Bosch and requested they administer his Zometa dose on 8/15 when next dose is due while he is there for transplant preparation.

## 2012-03-19 ENCOUNTER — Encounter: Payer: Self-pay | Admitting: Internal Medicine

## 2012-04-13 ENCOUNTER — Telehealth: Payer: Self-pay | Admitting: Oncology

## 2012-04-13 ENCOUNTER — Telehealth: Payer: Self-pay | Admitting: *Deleted

## 2012-04-13 NOTE — Telephone Encounter (Signed)
Patient left VM requesting appointment for lab/office visit week of 04/20/12. Has been released by [facility].

## 2012-04-13 NOTE — Telephone Encounter (Signed)
Pt called and gave him appt date for 05/04/12 MD visit only

## 2012-04-14 ENCOUNTER — Other Ambulatory Visit: Payer: Self-pay | Admitting: *Deleted

## 2012-04-14 DIAGNOSIS — C9000 Multiple myeloma not having achieved remission: Secondary | ICD-10-CM

## 2012-04-15 ENCOUNTER — Telehealth: Payer: Self-pay | Admitting: Nurse Practitioner

## 2012-04-15 NOTE — Telephone Encounter (Signed)
s.w. pt and advised on 10.8.13 appts....sed

## 2012-04-17 ENCOUNTER — Telehealth: Payer: Self-pay | Admitting: Oncology

## 2012-04-17 NOTE — Telephone Encounter (Signed)
pt called to r/s his 10/8 due to a conflict    aom

## 2012-04-20 ENCOUNTER — Other Ambulatory Visit (HOSPITAL_BASED_OUTPATIENT_CLINIC_OR_DEPARTMENT_OTHER): Payer: BC Managed Care – PPO | Admitting: Lab

## 2012-04-20 ENCOUNTER — Telehealth: Payer: Self-pay | Admitting: Oncology

## 2012-04-20 ENCOUNTER — Ambulatory Visit (HOSPITAL_BASED_OUTPATIENT_CLINIC_OR_DEPARTMENT_OTHER): Payer: BC Managed Care – PPO | Admitting: Nurse Practitioner

## 2012-04-20 VITALS — BP 151/86 | HR 98 | Temp 98.6°F | Resp 20 | Ht 72.0 in | Wt 200.5 lb

## 2012-04-20 DIAGNOSIS — C9000 Multiple myeloma not having achieved remission: Secondary | ICD-10-CM

## 2012-04-20 LAB — CBC WITH DIFFERENTIAL/PLATELET
Eosinophils Absolute: 0 10*3/uL (ref 0.0–0.5)
HCT: 28.6 % — ABNORMAL LOW (ref 38.4–49.9)
HGB: 9.8 g/dL — ABNORMAL LOW (ref 13.0–17.1)
LYMPH%: 29.8 % (ref 14.0–49.0)
MONO#: 0.5 10*3/uL (ref 0.1–0.9)
NEUT#: 1.3 10*3/uL — ABNORMAL LOW (ref 1.5–6.5)
NEUT%: 49.9 % (ref 39.0–75.0)
Platelets: 127 10*3/uL — ABNORMAL LOW (ref 140–400)
WBC: 2.5 10*3/uL — ABNORMAL LOW (ref 4.0–10.3)

## 2012-04-20 NOTE — Progress Notes (Signed)
OFFICE PROGRESS NOTE  Interval history:  Chad [name] returns as scheduled. He received melphalan 200 mg per meter squared IV on 03/17/2012 with autologous stem cell support on 03/18/2012. Course complicated by fever/sepsis requiring ICU admission. He was discharged home on 04/03/2012.  Chad [name] reports slow improvement in his energy level. He denies any fevers. No cough or shortness of breath. He denies bleeding. Appetite is beginning to improve. No bowel or bladder problems. He intermittently notes numbness in the feet. The numbness typically occurs at nighttime.   Objective: Blood pressure 151/86, pulse 98, temperature 98.6 F (37 C), temperature source Oral, resp. rate 20, height 6' (1.829 m), weight 200 lb 8 oz (90.946 kg).  Alopecia. Oropharynx is without thrush or ulceration. Mucous membranes are pink and moist. Faint inspiratory rales at the right lung base. Regular cardiac rhythm. Abdomen is soft and nontender. No organomegaly. Extremities are without edema.  Lab Results: Lab Results  Component Value Date   WBC 2.5* 04/20/2012   HGB 9.8* 04/20/2012   HCT 28.6* 04/20/2012   MCV 93.8 04/20/2012   PLT 127* 04/20/2012    Chemistry:    Chemistry      Component Value Date/Time   NA 138 01/10/2012 1324   K 3.8 01/10/2012 1324   CL 104 01/10/2012 1324   CO2 22 01/10/2012 1324   BUN 9 01/10/2012 1324   CREATININE 0.85 01/10/2012 1324      Component Value Date/Time   CALCIUM 9.0 01/10/2012 1324   ALKPHOS 55 12/16/2011 0810   AST 16 12/16/2011 0810   ALT 22 12/16/2011 0810   BILITOT 0.4 12/16/2011 0810       Studies/Results: No results found.  Medications: I have reviewed the patient's current medications.  Assessment/Plan:  1. Multiple myeloma-status post high-dose chemotherapy with autologous stem cell support in November of 2004. The sero monoclonal protein was slowly rising and a restaging bone marrow biopsy in November 2009 confirmed 10 to 20% plasma cells. The serum light chains were  elevated 07/25/2008. He completed 12 cycles of salvage therapy with Revlimid/Decadron with the last cycle initiated 06/16/2009. On 07/13/2009 the serum free light chains were normal and a serum protein electrophoresis/immunofixation revealed no monoclonal protein. Elevated serum M spike and serum free kappa light chains March 2013. He began cycle 1 of Revlimid 25 mg daily x21 days followed by a 7 day break on 09/27/2011. He began a second cycle of salvage Revlimid beginning on 10/25/2011. -The serum M spike was stable and the serum free kappa light chains were slightly more elevated on 11/19/2011. -He began another cycle of Revlimid/Decadron on 11/22/2011. Velcade was added beginning on 11/29/2011. He completed 3 cycles of Revlimid/Velcade/Decadron with improvement in the serum M spike/serum free kappa light chains. The last cycle was initiated on 01/20/2012. He received melphalan 200 mg per meter squared on 03/17/2012 with autologous stem cell support on 03/18/2012. 2. A serum immunofixation confirmed a monoclonal protein and the serum Kappa and Lambda light chains were mildly elevated in June of 2012.  3. History of elevated liver enzymes. 4. Right lung pneumonia June, 2009 -- resolved after treatment with Bactrim and azithromycin. 5. 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 He continues to have intermittent numbness in the feet. 6. History of hypokalemia -he is no longer taking a potassium supplement 7. Pneumonia 01/06/2009 -- requiring hospitalization 01/10/2011 -- status post broad spectrum antibiotic therapy with clinical resolution. 8. Upper respiratory infection in February, 2011 --  likely a viral URI.  9. Right shoulder/upper arm pain. Plain x-ray of the right shoulder/humerus on 10/16/2011 showed 2 small lytic lesions at the proximal humeral shaft suspicious for myeloma involvement. MRI of the right shoulder 10/27/2011 confirmed multiple  myelomatous lesions involving the proximal right humerus, scapula, and ribs. A large destructive lesion was noted in the scapular body with an associated soft tissue component. He completed palliative radiation to the right scapula on 11/19/2011 with partial improvement in the shoulder discomfort. 10. "Hemorrhoid" discomfort and urinary hesitancy on 11/15/2011. He appeared to have an ulcer or fissure at the anal verge. Symptoms have resolved. 11. Fever/sepsis/bacteremia/question pneumonia September 2013 following stem cell therapy.  Disposition-Chad [name] appears stable. He continues Bactrim and Valtrex prophylaxis. He will return for labs and a followup visit in 2 weeks. He will contact the office in the interim with any problems.  Plan reviewed with Dr. Truett Perna.   Lonna Cobb ANP/GNP-BC

## 2012-04-20 NOTE — Telephone Encounter (Signed)
Gave pt appt for October 2013 lab and MD 

## 2012-04-21 ENCOUNTER — Other Ambulatory Visit: Payer: BC Managed Care – PPO | Admitting: Lab

## 2012-04-21 ENCOUNTER — Ambulatory Visit: Payer: BC Managed Care – PPO | Admitting: Nurse Practitioner

## 2012-04-22 ENCOUNTER — Encounter: Payer: Self-pay | Admitting: *Deleted

## 2012-04-22 NOTE — Telephone Encounter (Signed)
No charge, error in opening chart, Attempting to acquire MyChart activation code for patient.

## 2012-05-04 ENCOUNTER — Ambulatory Visit (HOSPITAL_BASED_OUTPATIENT_CLINIC_OR_DEPARTMENT_OTHER): Payer: BC Managed Care – PPO | Admitting: Oncology

## 2012-05-04 ENCOUNTER — Telehealth: Payer: Self-pay | Admitting: *Deleted

## 2012-05-04 ENCOUNTER — Other Ambulatory Visit (HOSPITAL_BASED_OUTPATIENT_CLINIC_OR_DEPARTMENT_OTHER): Payer: BC Managed Care – PPO | Admitting: Lab

## 2012-05-04 ENCOUNTER — Telehealth: Payer: Self-pay | Admitting: Oncology

## 2012-05-04 VITALS — BP 143/83 | HR 92 | Temp 99.2°F | Resp 20 | Ht 72.0 in | Wt 207.7 lb

## 2012-05-04 DIAGNOSIS — C9000 Multiple myeloma not having achieved remission: Secondary | ICD-10-CM

## 2012-05-04 LAB — CBC WITH DIFFERENTIAL/PLATELET
BASO%: 1.4 % (ref 0.0–2.0)
Basophils Absolute: 0.1 10*3/uL (ref 0.0–0.1)
EOS%: 7.2 % — ABNORMAL HIGH (ref 0.0–7.0)
HCT: 32.5 % — ABNORMAL LOW (ref 38.4–49.9)
LYMPH%: 23.8 % (ref 14.0–49.0)
MCH: 34.6 pg — ABNORMAL HIGH (ref 27.2–33.4)
MCHC: 35.3 g/dL (ref 32.0–36.0)
MONO#: 0.4 10*3/uL (ref 0.1–0.9)
NEUT%: 59.1 % (ref 39.0–75.0)
Platelets: 152 10*3/uL (ref 140–400)

## 2012-05-04 MED ORDER — SULFAMETHOXAZOLE-TMP DS 800-160 MG PO TABS: 1.0000 | ORAL_TABLET | Freq: Two times a day (BID) | ORAL | Status: DC

## 2012-05-04 MED ORDER — VALACYCLOVIR HCL 500 MG PO TABS: 500.0000 mg | ORAL_TABLET | Freq: Every day | ORAL | Status: DC

## 2012-05-04 NOTE — Telephone Encounter (Signed)
Per staff message and POF I have scheduled appt.  JMW  

## 2012-05-04 NOTE — Progress Notes (Signed)
[facility]    OFFICE PROGRESS NOTE   INTERVAL HISTORY:   He returns as scheduled. His energy level has improved and he has gained weight. No fever.  Objective:  Vital signs in last 24 hours:  Blood pressure 143/83, pulse 92, temperature 99.2 F (37.3 C), resp. rate 20, height 6' (1.829 m), weight 207 lb 11.2 oz (94.212 kg).    HEENT: No thrush or ulcers Resp: Lungs clear bilaterally Cardio: Regular rate and rhythm GI: No hepatosplenomegaly Vascular: No leg edema   Lab Results:  Lab Results  Component Value Date   WBC 4.4 05/04/2012   HGB 11.5* 05/04/2012   HCT 32.5* 05/04/2012   MCV 98.0 05/04/2012   PLT 152 05/04/2012   ANC 2.6   Medications: I have reviewed the patient's current medications.  Assessment/Plan: 1. Multiple myeloma-status post high-dose chemotherapy with autologous stem cell support in November of 2004. The sero monoclonal protein was slowly rising and a restaging bone marrow biopsy in November 2009 confirmed 10 to 20% plasma cells. The serum light chains were elevated 07/25/2008. He completed 12 cycles of salvage therapy with Revlimid/Decadron with the last cycle initiated 06/16/2009. On 07/13/2009 the serum free light chains were normal and a serum protein electrophoresis/immunofixation revealed no monoclonal protein. Elevated serum M spike and serum free kappa light chains March 2013. He began cycle 1 of Revlimid 25 mg daily x21 days followed by a 7 day break on 09/27/2011. He began a second cycle of salvage Revlimid beginning on 10/25/2011. -The serum M spike was stable and the serum free kappa light chains were slightly more elevated on 11/19/2011. -He began another cycle of Revlimid/Decadron on 11/22/2011. Velcade was added beginning on 11/29/2011. He completed 3 cycles of Revlimid/Velcade/Decadron with improvement in the serum M spike/serum free kappa light chains. The last cycle was initiated on 01/20/2012. He received melphalan 200  mg per meter squared on 03/17/2012 with autologous stem cell support on 03/18/2012. 2. A serum immunofixation confirmed a monoclonal protein and the serum Kappa and Lambda light chains were mildly elevated in June of 2012.  3. History of elevated liver enzymes. 4. Right lung pneumonia June, 2009 -- resolved after treatment with Bactrim and azithromycin. 5. 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 He continues to have intermittent numbness in the feet. 6. History of hypokalemia -he is no longer taking a potassium supplement 7. Pneumonia 01/06/2009 -- requiring hospitalization 01/10/2011 -- status post broad spectrum antibiotic therapy with clinical resolution. 8. Upper respiratory infection in February, 2011 -- likely a viral URI.  9. Right shoulder/upper arm pain. Plain x-ray of the right shoulder/humerus on 10/16/2011 showed 2 small lytic lesions at the proximal humeral shaft suspicious for myeloma involvement. MRI of the right shoulder 10/27/2011 confirmed multiple myelomatous lesions involving the proximal right humerus, scapula, and ribs. A large destructive lesion was noted in the scapular body with an associated soft tissue component. He completed palliative radiation to the right scapula on 11/19/2011 with partial improvement in the shoulder discomfort. 10. "Hemorrhoid" discomfort and urinary hesitancy on 11/15/2011. He appeared to have an ulcer or fissure at the anal verge. Symptoms have resolved. 11. Fever/sepsis/bacteremia/question pneumonia September 2013 following stem cell therapy.   Disposition:  He appears well. The white count and platelets have recovered following the stem cell therapy last month. He continues Bactrim and Valtrex prophylaxis.  Mr. Trester is scheduled for an appointment at [facility] in 2 weeks. He will  discuss the indication for "maintenance "therapy, an influenza vaccine, and continuation of Zometa with Dr.  Lance Bosch.  He will return for an office visit here on 06/19/2012. We will see him sooner as needed.   Thornton Papas, MD  05/04/2012  6:05 PM

## 2012-05-04 NOTE — Telephone Encounter (Signed)
Printed and gv appt schedule for DEC.Marland Kitchenthe pt is aware of zometa after appt....emailed michelle to add.

## 2012-06-19 ENCOUNTER — Ambulatory Visit (HOSPITAL_BASED_OUTPATIENT_CLINIC_OR_DEPARTMENT_OTHER): Payer: BC Managed Care – PPO

## 2012-06-19 ENCOUNTER — Ambulatory Visit (HOSPITAL_BASED_OUTPATIENT_CLINIC_OR_DEPARTMENT_OTHER): Payer: BC Managed Care – PPO | Admitting: Oncology

## 2012-06-19 ENCOUNTER — Other Ambulatory Visit: Payer: Self-pay | Admitting: *Deleted

## 2012-06-19 VITALS — BP 159/90 | HR 72 | Temp 97.0°F | Resp 18 | Ht 72.0 in | Wt 217.2 lb

## 2012-06-19 DIAGNOSIS — Z9484 Stem cells transplant status: Secondary | ICD-10-CM

## 2012-06-19 DIAGNOSIS — C9000 Multiple myeloma not having achieved remission: Secondary | ICD-10-CM

## 2012-06-19 MED ORDER — LENALIDOMIDE 10 MG PO CAPS: 10.0000 mg | ORAL_CAPSULE | Freq: Every day | ORAL | Status: DC

## 2012-06-19 MED ORDER — ZOLEDRONIC ACID 4 MG/5ML IV CONC
4.0000 mg | Freq: Once | INTRAVENOUS | Status: AC
  Administered 2012-06-19: 4 mg via INTRAVENOUS
  Filled 2012-06-19: qty 5

## 2012-06-19 MED ORDER — CALCIUM 600-200 MG-UNIT PO TABS: 1.0000 | ORAL_TABLET | Freq: Two times a day (BID) | ORAL | Status: DC

## 2012-06-19 MED ORDER — SODIUM CHLORIDE 0.9 % IV SOLN
Freq: Once | INTRAVENOUS | Status: AC
  Administered 2012-06-19: 11:00:00 via INTRAVENOUS

## 2012-06-19 NOTE — Telephone Encounter (Signed)
Called and spoke with pt; per Dr. Truett Perna instructed pt to start taking Caltrate 600-200mg  twice a day.  Pt verbalized understanding of instructions.

## 2012-06-19 NOTE — Progress Notes (Signed)
[facility]    OFFICE PROGRESS NOTE   INTERVAL HISTORY:   He returns as scheduled. His energy level has improved. He has returned to work. Mild intermittent "tingling" in the feet. No pain. No recent infection.  He saw Dr. Lance Bosch on 05/19/2012. Dr. Lance Bosch recommends maintenance Revlimid and every 3 month Zometa.  Objective:  Vital signs in last 24 hours:  Blood pressure 159/90, pulse 72, temperature 97 F (36.1 C), temperature source Oral, resp. rate 18, height 6' (1.829 m), weight 217 lb 3.2 oz (98.521 kg).    HEENT: No thrush or ulcers Resp: Lungs clear bilaterally Cardio: Regular rate and rhythm GI: No hepatomegaly Vascular: No leg edema   Lab Results:  Labs from [facility] on 05/19/2012-platelets 157, hemoglobin 12.3, white count 4.3, ANC 2.6., Creatinine 0.92, calcium 9.4  Serum immunofixation-monoclonal IgG kappa, too low to accurately quantify. Serum free kappa light chains-2.13 (0.33-1.94)   Medications: I have reviewed the patient's current medications.  Assessment/Plan: 1. Multiple myeloma-status post high-dose chemotherapy with autologous stem cell support in November of 2004. The sero monoclonal protein was slowly rising and a restaging bone marrow biopsy in November 2009 confirmed 10 to 20% plasma cells. The serum light chains were elevated 07/25/2008. He completed 12 cycles of salvage therapy with Revlimid/Decadron with the last cycle initiated 06/16/2009. On 07/13/2009 the serum free light chains were normal and a serum protein electrophoresis/immunofixation revealed no monoclonal protein. Elevated serum M spike and serum free kappa light chains March 2013. He began cycle 1 of Revlimid 25 mg daily x21 days followed by a 7 day break on 09/27/2011. He began a second cycle of salvage Revlimid beginning on 10/25/2011. -The serum M spike was stable and the serum free kappa light chains were slightly more elevated on 11/19/2011. -He began another cycle of  Revlimid/Decadron on 11/22/2011. Velcade was added beginning on 11/29/2011. He completed 3 cycles of Revlimid/Velcade/Decadron with improvement in the serum M spike/serum free kappa light chains. The last cycle was initiated on 01/20/2012. He received melphalan 200 mg per meter squared on 03/17/2012 with autologous stem cell support on 03/18/2012.       -Serum immunofixation confirmed a monoclonal IgG kappa protein, too low to quantify and a mild elevation of the serum free kappa light chains on 05/19/2012. 2. A serum immunofixation confirmed a monoclonal protein and the serum Kappa and Lambda light chains were mildly elevated in June of 2012.  3. History of elevated liver enzymes. 4. Right lung pneumonia June, 2009 -- resolved after treatment with Bactrim and azithromycin. 5. 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 He continues to have intermittent numbness in the feet. 6. History of hypokalemia -he is no longer taking a potassium supplement 7. Pneumonia 01/06/2009 -- requiring hospitalization 01/10/2011 -- status post broad spectrum antibiotic therapy with clinical resolution. 8. Upper respiratory infection in February, 2011 -- likely a viral URI.  9. Right shoulder/upper arm pain. Plain x-ray of the right shoulder/humerus on 10/16/2011 showed 2 small lytic lesions at the proximal humeral shaft suspicious for myeloma involvement. MRI of the right shoulder 10/27/2011 confirmed multiple myelomatous lesions involving the proximal right humerus, scapula, and ribs. A large destructive lesion was noted in the scapular body with an associated soft tissue component. He completed palliative radiation to the right scapula on 11/19/2011 with partial improvement in the shoulder discomfort. 10. "Hemorrhoid" discomfort and urinary hesitancy on 11/15/2011. He appeared to have an ulcer or fissure  at the anal verge. Symptoms have  resolved. 11. Fever/sepsis/bacteremia/question pneumonia September 2013 following stem cell therapy.   Disposition:  He has recovered from the stem cell therapy. Dr. Lance Bosch recommends maintenance Revlimid. Mr. Cayer will begin Revlimid at a dose of 10 mg daily on approximately 06/26/2012. He will begin every 3 month Zometa today. He is scheduled for a CBC on 07/10/2012 and 07/24/2012. Mr. Salzman will return for an office visit and a serum protein electrophoresis/serum free light chain analysis on 08/07/2012.   Thornton Papas, MD  06/19/2012  3:55 PM

## 2012-06-19 NOTE — Patient Instructions (Signed)
Zometa (Zoledronic Acid) What is this medicine? ZOLEDRONIC ACID (ZOE le dron ik AS id) lowers the amount of calcium loss from bone. It is used to treat too much calcium in your blood from cancer. It is also used to prevent complications of cancer that has spread to the bone. This medicine may be used for other purposes; ask your health care provider or pharmacist if you have questions.  What should I tell my health care provider before I take this medicine? They need to know if you have any of these conditions: -aspirin-sensitive asthma -dental disease -kidney disease -an unusual or allergic reaction to zoledronic acid, other medicines, foods, dyes, or preservatives -pregnant or trying to get pregnant -breast-feeding  How should I use this medicine? This medicine is for infusion into a vein. It is given by a health care professional in a hospital or clinic setting. Talk to your pediatrician regarding the use of this medicine in children. Special care may be needed. Overdosage: If you think you have taken too much of this medicine contact a poison control center or emergency room at once. NOTE: This medicine is only for you. Do not share this medicine with others. What if I miss a dose? It is important not to miss your dose. Call your doctor or health care professional if you are unable to keep an appointment.  What may interact with this medicine? -certain antibiotics given by injection -NSAIDs, medicines for pain and inflammation, like ibuprofen or naproxen -some diuretics like bumetanide, furosemide -teriparatide -thalidomide This list may not describe all possible interactions. Give your health care provider a list of all the medicines, herbs, non-prescription drugs, or dietary supplements you use. Also tell them if you smoke, drink alcohol, or use illegal drugs. Some items may interact with your medicine.  What should I watch for while using this medicine? Visit your doctor or  health care professional for regular checkups. It may be some time before you see the benefit from this medicine. Do not stop taking your medicine unless your doctor tells you to. Your doctor may order blood tests or other tests to see how you are doing. Women should inform their doctor if they wish to become pregnant or think they might be pregnant. There is a potential for serious side effects to an unborn child. Talk to your health care professional or pharmacist for more information. You should make sure that you get enough calcium and vitamin D while you are taking this medicine. Discuss the foods you eat and the vitamins you take with your health care professional. Some people who take this medicine have severe bone, joint, and/or muscle pain. This medicine may also increase your risk for a broken thigh bone. Tell your doctor right away if you have pain in your upper leg or groin. Tell your doctor if you have any pain that does not go away or that gets worse.  What side effects may I notice from receiving this medicine? Side effects that you should report to your doctor or health care professional as soon as possible: -allergic reactions like skin rash, itching or hives, swelling of the face, lips, or tongue -anxiety, confusion, or depression -breathing problems -changes in vision -feeling faint or lightheaded, falls -jaw burning, cramping, pain -muscle cramps, stiffness, or weakness -trouble passing urine or change in the amount of urine  Side effects that usually do not require medical attention (report to your doctor or health care professional if they continue or are bothersome): -bone,  joint, or muscle pain -fever -hair loss -irritation at site where injected -loss of appetite -nausea, vomiting -stomach upset -tired This list may not describe all possible side effects. Call your doctor for medical advice about side effects. You may report side effects to FDA at  1-800-FDA-1088. Where should I keep my medicine? This drug is given in a hospital or clinic and will not be stored at home. NOTE: This sheet is a summary. It may not cover all possible information. If you have questions about this medicine, talk to your doctor, pharmacist, or health care provider.  2012, Elsevier/Gold Standard. (12/28/2010 9:06:58 AM)

## 2012-06-20 ENCOUNTER — Other Ambulatory Visit: Payer: Self-pay | Admitting: Oncology

## 2012-06-20 DIAGNOSIS — C9000 Multiple myeloma not having achieved remission: Secondary | ICD-10-CM

## 2012-06-22 MED ORDER — CALCIUM 600-200 MG-UNIT PO TABS: 1.0000 | ORAL_TABLET | Freq: Two times a day (BID) | ORAL | Status: AC

## 2012-06-23 ENCOUNTER — Other Ambulatory Visit: Payer: Self-pay | Admitting: *Deleted

## 2012-06-23 DIAGNOSIS — C9000 Multiple myeloma not having achieved remission: Secondary | ICD-10-CM

## 2012-06-23 MED ORDER — LENALIDOMIDE 10 MG PO CAPS: 10.0000 mg | ORAL_CAPSULE | Freq: Every day | ORAL | Status: DC

## 2012-06-23 NOTE — Telephone Encounter (Signed)
Script for Revlimid forwarded to managed care department. Left VM for patient to call [employer] to do his patient survey and to call office when he receives the drug so we can document the start date.

## 2012-06-29 ENCOUNTER — Telehealth: Payer: Self-pay | Admitting: *Deleted

## 2012-06-29 NOTE — Telephone Encounter (Signed)
Patient called to inquire of status of his Revlimid script? Has not heard from pharmacy. Already did his patient survey last week as requested. Called managed care department for status update. Lanora Manis will follow up.

## 2012-06-30 ENCOUNTER — Encounter: Payer: Self-pay | Admitting: Oncology

## 2012-07-01 NOTE — Progress Notes (Signed)
Faxed Revlimid Rx to [facility].  05/01/12 Fax from [facility] stating they received the RX. They will have to send this to [facility].

## 2012-07-02 NOTE — Telephone Encounter (Signed)
RECEIVED A FAX FROM [facility] CONCERNING A NOTICE OF RX TRANSFER TO [facility]. NOTIFIED DR. [name]'S NURSES, AMY HORTON,RN AND SUSAN COWARD,RN.

## 2012-07-07 ENCOUNTER — Telehealth: Payer: Self-pay | Admitting: *Deleted

## 2012-07-07 NOTE — Telephone Encounter (Signed)
Confirmed with [facility] that Revlimid 10 mg is ready for delivery. Patient needs to call #[phone] to arrange for delivery.  Notified patient and he will call. Agrees to call office with start date of medication so it can be documented and and follow up labs can be scheduled.

## 2012-07-10 ENCOUNTER — Other Ambulatory Visit: Payer: BC Managed Care – PPO | Admitting: Lab

## 2012-07-10 ENCOUNTER — Other Ambulatory Visit: Payer: BC Managed Care – PPO

## 2012-07-13 ENCOUNTER — Telehealth: Payer: Self-pay | Admitting: *Deleted

## 2012-07-13 NOTE — Telephone Encounter (Signed)
Spoke with patient, ok for 1st labs to be 07/24/12 after starting revlimid on 07/11/12.

## 2012-07-24 ENCOUNTER — Other Ambulatory Visit (HOSPITAL_BASED_OUTPATIENT_CLINIC_OR_DEPARTMENT_OTHER): Payer: BC Managed Care – PPO

## 2012-07-24 DIAGNOSIS — C9000 Multiple myeloma not having achieved remission: Secondary | ICD-10-CM

## 2012-07-24 LAB — CBC WITH DIFFERENTIAL/PLATELET
BASO%: 1.3 % (ref 0.0–2.0)
EOS%: 5.3 % (ref 0.0–7.0)
HGB: 13.5 g/dL (ref 13.0–17.1)
MCH: 33.9 pg — ABNORMAL HIGH (ref 27.2–33.4)
MCHC: 35.8 g/dL (ref 32.0–36.0)
MCV: 94.8 fL (ref 79.3–98.0)
MONO%: 11.2 % (ref 0.0–14.0)
RBC: 3.97 10*6/uL — ABNORMAL LOW (ref 4.20–5.82)
RDW: 13.7 % (ref 11.0–14.6)
lymph#: 1.1 10*3/uL (ref 0.9–3.3)

## 2012-07-30 ENCOUNTER — Other Ambulatory Visit: Payer: Self-pay | Admitting: *Deleted

## 2012-07-30 DIAGNOSIS — C9000 Multiple myeloma not having achieved remission: Secondary | ICD-10-CM

## 2012-07-30 MED ORDER — LENALIDOMIDE 10 MG PO CAPS: 10.0000 mg | ORAL_CAPSULE | Freq: Every day | ORAL | Status: DC

## 2012-07-31 ENCOUNTER — Encounter: Payer: Self-pay | Admitting: Pulmonary Disease

## 2012-07-31 ENCOUNTER — Ambulatory Visit (INDEPENDENT_AMBULATORY_CARE_PROVIDER_SITE_OTHER)
Admission: RE | Admit: 2012-07-31 | Discharge: 2012-07-31 | Disposition: A | Discharge status: Home / Self Care | Payer: BC Managed Care – PPO | Source: Ambulatory Visit | Attending: Pulmonary Disease | Admitting: Pulmonary Disease

## 2012-07-31 ENCOUNTER — Other Ambulatory Visit (INDEPENDENT_AMBULATORY_CARE_PROVIDER_SITE_OTHER): Payer: BC Managed Care – PPO

## 2012-07-31 ENCOUNTER — Ambulatory Visit (INDEPENDENT_AMBULATORY_CARE_PROVIDER_SITE_OTHER): Payer: BC Managed Care – PPO | Admitting: Pulmonary Disease

## 2012-07-31 VITALS — BP 140/90 | HR 74 | Temp 98.3°F | Ht 72.0 in | Wt 218.4 lb

## 2012-07-31 DIAGNOSIS — D126 Benign neoplasm of colon, unspecified: Secondary | ICD-10-CM

## 2012-07-31 DIAGNOSIS — E7800 Pure hypercholesterolemia, unspecified: Secondary | ICD-10-CM

## 2012-07-31 DIAGNOSIS — C9000 Multiple myeloma not having achieved remission: Secondary | ICD-10-CM

## 2012-07-31 DIAGNOSIS — I2510 Atherosclerotic heart disease of native coronary artery without angina pectoris: Secondary | ICD-10-CM

## 2012-07-31 DIAGNOSIS — Z0000 Encounter for general adult medical examination without abnormal findings: Secondary | ICD-10-CM

## 2012-07-31 DIAGNOSIS — I10 Essential (primary) hypertension: Secondary | ICD-10-CM

## 2012-07-31 DIAGNOSIS — E663 Overweight: Secondary | ICD-10-CM

## 2012-07-31 LAB — HEPATIC FUNCTION PANEL
ALT: 30 U/L (ref 0–53)
Alkaline Phosphatase: 48 U/L (ref 39–117)
Bilirubin, Direct: 0.1 mg/dL (ref 0.0–0.3)
Total Bilirubin: 1 mg/dL (ref 0.3–1.2)

## 2012-07-31 LAB — LIPID PANEL
LDL Cholesterol: 104 mg/dL — ABNORMAL HIGH (ref 0–99)
Total CHOL/HDL Ratio: 3
Triglycerides: 116 mg/dL (ref 0.0–149.0)

## 2012-07-31 LAB — BASIC METABOLIC PANEL
Calcium: 9.8 mg/dL (ref 8.4–10.5)
Chloride: 104 mEq/L (ref 96–112)
Creatinine, Ser: 1 mg/dL (ref 0.4–1.5)
Sodium: 139 mEq/L (ref 135–145)

## 2012-07-31 LAB — PSA: PSA: 0.5 ng/mL (ref 0.10–4.00)

## 2012-07-31 NOTE — Patient Instructions (Addendum)
Today we updated your med list in our EPIC system...    Continue your current medications the same...  Today we did your follow up CXR & FASTING blood work...    We will contact you w/ the results when avail...  Call for any questions or if we can be of service in any way...  Let's plan a similar physical in 1 yr, call anytime if needed for problems.Marland KitchenMarland Kitchen

## 2012-08-01 ENCOUNTER — Encounter: Payer: Self-pay | Admitting: Pulmonary Disease

## 2012-08-01 NOTE — Progress Notes (Signed)
Subjective:    Patient ID: Chad [name], male    DOB: [birth year], 57 y.o.   MRN: [id number]  HPI 57 y/o WM, husb of Sedale Jenifer, here for a follow up visit & refill of meds... he is also followed closely by DrSherrill, Oncology w/ hx of Mult Myeloma> see below...  ~  December 17, 2010:  60yr ROV & Chad [name] feels that he is doing well> followed by DrSherrill for Heme as well as DrShea in [facility] he tells me he remains in remission from his Mult Myeloma after his salvage therapy w/ Revlilid & Decadron in 2010... He denies cough, sputum, CP, palpit, SOB/ DOE, edema, etc...  He ran out of his BP meds one month ago & his BP has been 130s/ 80s at home & he wants to try off meds for now;  Similarly he quit his Vytorin about a yr ago & trying "diet alone" but wt up to 233# & he has a long way to go w/ diet & exercise for this prob... F/u FLP w/ incr TG & LDL but he doesn't want meds yet & wants to try better diet & exercise program...  We reviewed recent labs from DrSherrill & we complimented those w/ FASTING labs at this time>> see below...  ~  July 31, 2012:  74mo ROV & Chad [name] has had an eventful interval> diagnosed w/ recurrent Myeloma 3/13 & treated w/ Revlimid, Decadron, Velcade chemoRx & referred back to [facility] 9/13 for another autologous StemCell transplant; he had a difficult post-Tx hosp w/ sepsis & was in the ICU but eventually recovered & now on maintenance Revlimid & Zometa from DrSherrill...  We reviewed the following medical problems during today's office visit >>      Hx Pneumonia> bouts in 2009 & 2010, resolved w/ antibiotics, baseline CXR w/ bibasilar atx & scarring    HBP> on diet alone; BP= 140/90 but better at home & DrSherrill's office ave 130s/80s; he denies CP, palpit, dizzy, SOB, edema...    CAD> on ASA81; known non-obstructive CAD (cath 2003) & he had Treadmill, 2DEcho, etc- all performed at [facility] prior to stem cell tx as part of a study & pt reports that they were OK...    CHOL> on diet alone & labs  are OK w/ FLP showing TChol 185, TG 116, HDL 58, LDL 104    Overweight> his peak wt was ~250# in 2007; states he lost down to ~200# in Sep2013 w/ stem cell tx, now back to 218# & asked to diet, exercise, keep weight down...    GI- colon polyps> last colonoscopy was 2008 w/ adenomatous polyp removed & f/u planned 106yrs but holding off due to the recent MM recurrence & stem cell Tx...     Mult Myeloma> on Valtrex500/d for 92yr for shingles prophylaxis after stem cell tx; on Maintenance Revlimid, Zometa from DrSherrill...    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    Hx urticaria & angioedema> Hx ACE cough, rash from Imipenum, then urticaria&angioedema from ?Avelox vs Diovan...   We reviewed prob list, meds, xrays and labs> see below for updates >> unable to get flu shot due to stem cell tx & chemoRx... CXR 1/14 showed normal heart size, clear lungs, NAD... LABS 1/14 showed FLP- at goals on diet w/ LDL=104;  Chems- wnl w/ BS=101;  CBC- wnl;  TSH=1.18;  PSA=0.50  Problem List:  Hx PNEUMONIA>  He had bouts of Pneumonia 6/09 (outpt) & 6/10 (hosp)> resolved w/ antibiotic Rx & no known sequellae... ~  CXR 2/11 showed bibasilar linear scarring & atx, no pneumonia etc...   HYPERTENSION (ICD-401.9) - prev controlled on HCTZ 25mg /d & K20/d, w/  BP in 2010 = 120/84... He tells me he ran out of his meds about 1 mo ago & BP today= 140/84 & he's been getting 130/80 at home since he ran out... denies HA, fatigue, visual changes, CP, palipit, dizziness, syncope, dyspnea, edema, etc... He wants to try off the med going forward & will monitor his BP carefully... ~  1/14: on diet alone; BP= 140/90 but better at home & DrSherrill's office ave 130s/80s; he denies CP, palpit, dizzy, SOB, edema   CORONARY ARTERY DISEASE (ICD-414.00) - on ASA 81mg /d... adm w/ CP in 2003 w/ cath showing non-obstructive CAD- 40% lesion in sm  ramus branch off the LAD, 30-40% midCIRC, otherw neg, EF= 65%... med rx w/ ASA 81mg /d + secondary risk factor reduction strategy... ~  NuclearStressTest 3/00 was neg- no ischmia, no infarct, EF= 69%... ~  6/12:  I reminded pt that secondary risk factor reduction strategy includes BP & Chol prominently! ~  1/14:  He had extensive testing w/ treadmill & 2DEcho at [facility] prior to his stem cell tx 9/13...  HYPERCHOLESTEROLEMIA (ICD-272.0) - prev on VYTORIN 10-20 daily, he stopped it on his own some time in 2011 for no particular reason> ~  FLP 7/06 on Vytor10-20 showed TChol 161, TG 66, HDL 44, LDL 104. ~  FLP 3/08 on Vytor10-20 showed TChol 125, TG 66, HDL 46, LDL 66 ~  FLP 4/10 on Vytor10-20 showed TChol 149, TG 79, HDL 64, LDL 69 ~  FLP 6/12 off med for 33yr showed TChol 192, TG 207, HDL 58, LDL 129... He doesn't want to go back on meds, therefore needs much better diet! ~  FLP 1/14 on diet alone showed TChol 185, TG 116, HDL 58, LDL 104  OVERWEIGHT (ICD-278.02) - we discussed diet + exercise recommendations... ~  peak weight = 248# in 2007... ~  weight 10/08 = 244# ~  weight 3/10 = 226# ~  Weight 6/12 = 233#... We reviewed diet + exercise program required for wt reduction. ~  Weight 1/14 = 218#... He reports down to 2003 after stem cell tx 9/13.  COLONIC POLYPS (ICD-211.3) - last colonoscopy 10/08 by DrDBrodie showed 6mm desc colon polyp & grI hems... path= adenomatous w/ f/u planned for 51yrs...  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 since then he's been followed regularly at [facility] and by DrSherrill (on Zometa thru 2007)... his monoclonal protein had been slowly rising & restaging bone marrow bx 11/09 showed 10-20% plasma cells... started on Revlimid (Thalidomide)/ weekly Decadron Jan10 for 12 cycles thru Dec10 at which time his SPE/ IEP showed no  monoclonal protein... Serial labs showed return of Myeloma markers 3/13 w/ Revlimid, Decadron, Velcade chemotherapy restarted; he had 10XRT treatments for Bone lesion in right humerus/ scapula/ ribs;  then referred back to [facility] for another autologous stem cell transplant 9/13;  He had a stormy hosp course w/ sepsis & was quite ill in ICU etc; eventually recovered & disch w/ remission of his myeloma- now on maintenance Revlimid (& Zometa Q27mo) per DrSherrill... ~  He gets most  of his labs at [facility] & DrShea communicates w/ DrSherrill... ~  [facility] plans re-vaccinations 43yr after his stem cell tx (this will be 9/14)...  Hx of URTICARIA (ICD-708.9) & Hx of ANGIOEDEMA (ICD-995.1) - he developed angioedema of the lips and hives all over after being given Avelox for pneumonia by an [facility] in [place]... he was also on Diovan at the time and this was felt to be the more likely culprit... hx of ACE cough in the past & Imipenim rash yrs ago...   No past surgical history on file.   Outpatient Encounter Prescriptions as of 07/31/2012  Medication Sig Dispense Refill  . acetaminophen (TYLENOL) 325 MG tablet Take 650 mg by mouth every 6 (six) hours as needed.      Marland Kitchen aspirin 81 MG tablet Take 81 mg by mouth daily.        . Calcium 600-200 MG-UNIT per tablet Take 1 tablet by mouth 2 (two) times daily.  60 tablet  1  . lenalidomide (REVLIMID) 10 MG capsule Take 1 capsule (10 mg total) by mouth daily.  28 capsule  0  . prochlorperazine (COMPAZINE) 10 MG tablet Take 1 tablet (10 mg total) by mouth every 6 (six) hours as needed.  30 tablet  3  . valACYclovir (VALTREX) 500 MG tablet Take 1 tablet (500 mg total) by mouth daily.  30 tablet  5  . Zoledronic Acid (ZOMETA IV) Inject 4 mg into the vein every 3 (three) months.         Allergies  Allergen Reactions  . Lisinopril     REACTION: ALLERGIC to ACE w/ cough...  . Penicillins     rash  . Valsartan     REACTION: ALLERGIC to ARB w/ Urticaria \\T \ Angioedema     Current Medications, Allergies, Past Medical History, Past Surgical History, Family History, and Social History were reviewed in [employer] record.   Review of Systems       See HPI - all other systems neg except as noted... The patient denies anorexia, fever, weight loss, weight gain, vision loss, decreased hearing, hoarseness, chest pain, syncope, dyspnea on exertion, peripheral edema, prolonged cough, headaches, hemoptysis, abdominal pain, melena, hematochezia, severe indigestion/heartburn, hematuria, incontinence, muscle weakness, suspicious skin lesions, transient blindness, difficulty walking, depression, unusual weight change, abnormal bleeding, enlarged lymph nodes, and angioedema.    Objective:   Physical Exam     WD, Overweight, 57 y/o WM in NAD... GENERAL:  Alert & oriented; pleasant & cooperative. HEENT:  [place]/AT, EOM-wnl, PERRLA, Fundi-benign, EACs-clear, TMs-wnl, NOSE-clear, THROAT-clear & wnl. NECK:  Supple w/ fairROM; no JVD; normal carotid impulses w/o bruits; no thyromegaly or nodules palpated; no lymphadenopathy. CHEST:  Clear to P & A; without wheezes/ rales/ or rhonchi. HEART:  Regular Rhythm; without murmurs/ rubs/ or gallops. ABDOMEN:  Soft & nontender; normal bowel sounds; no organomegaly or masses detected. EXT: without deformities or arthritic changes; no varicose veins/ venous insuffic/ or edema. NEURO:  CN's intact; motor testing normal; sensory testing normal; gait normal & balance OK. DERM:  No lesions noted; no rash etc...  RADIOLOGY DATA:  Reviewed in the EPIC EMR & discussed w/ the patient...  LABORATORY DATA:  Reviewed in the EPIC EMR & discussed w/ the patient...   Assessment & Plan:    HBP>  He does not want to restart BP meds at this time; I reminded him of the coronary risk factors of HBP & Chol...  HYPERCHOL>  Off all lipid meds, current lipid  profile is good & he wants diet + exercise rx...  OVERWT>  Risk factor  control is dependent upon wt reduction & we discussed this...  Screening>  Up to date on colonoscopy & his PSA is wnl (0.50)...  MYELOMA>  followed by DrSherrill & DrShea; we reviewed their records...  Other medical problems as noted...   Patient's Medications  New Prescriptions   No medications on file  Previous Medications   ACETAMINOPHEN (TYLENOL) 325 MG TABLET    Take 650 mg by mouth every 6 (six) hours as needed.   ASPIRIN 81 MG TABLET    Take 81 mg by mouth daily.     CALCIUM 600-200 MG-UNIT PER TABLET    Take 1 tablet by mouth 2 (two) times daily.   LENALIDOMIDE (REVLIMID) 10 MG CAPSULE    Take 1 capsule (10 mg total) by mouth daily.   PROCHLORPERAZINE (COMPAZINE) 10 MG TABLET    Take 1 tablet (10 mg total) by mouth every 6 (six) hours as needed.   VALACYCLOVIR (VALTREX) 500 MG TABLET    Take 1 tablet (500 mg total) by mouth daily.   ZOLEDRONIC ACID (ZOMETA IV)    Inject 4 mg into the vein every 3 (three) months.   Modified Medications   No medications on file  Discontinued Medications   No medications on file

## 2012-08-04 NOTE — Progress Notes (Signed)
Quick Note:  ATC patient, no answer [id number] ______ 

## 2012-08-04 NOTE — Progress Notes (Signed)
Quick Note:  ATC patient no answer [id number] ______ 

## 2012-08-07 ENCOUNTER — Other Ambulatory Visit: Payer: BC Managed Care – PPO | Admitting: Lab

## 2012-08-07 ENCOUNTER — Ambulatory Visit (HOSPITAL_BASED_OUTPATIENT_CLINIC_OR_DEPARTMENT_OTHER): Payer: BC Managed Care – PPO | Admitting: Oncology

## 2012-08-07 ENCOUNTER — Telehealth: Payer: Self-pay | Admitting: *Deleted

## 2012-08-07 ENCOUNTER — Telehealth: Payer: Self-pay | Admitting: Oncology

## 2012-08-07 VITALS — BP 145/84 | HR 77 | Temp 99.3°F | Resp 18 | Ht 72.0 in | Wt 220.4 lb

## 2012-08-07 DIAGNOSIS — C9000 Multiple myeloma not having achieved remission: Secondary | ICD-10-CM

## 2012-08-07 LAB — CBC WITH DIFFERENTIAL/PLATELET
BASO%: 3.1 % — ABNORMAL HIGH (ref 0.0–2.0)
HCT: 39.6 % (ref 38.4–49.9)
MCHC: 35.9 g/dL (ref 32.0–36.0)
MONO#: 0.4 10*3/uL (ref 0.1–0.9)
NEUT%: 56.3 % (ref 39.0–75.0)
RBC: 4.15 10*6/uL — ABNORMAL LOW (ref 4.20–5.82)
RDW: 14 % (ref 11.0–14.6)
WBC: 4.4 10*3/uL (ref 4.0–10.3)
lymph#: 1 10*3/uL (ref 0.9–3.3)

## 2012-08-07 LAB — BASIC METABOLIC PANEL (CC13)
Calcium: 9.8 mg/dL (ref 8.4–10.4)
Potassium: 3.8 mEq/L (ref 3.5–5.1)
Sodium: 140 mEq/L (ref 136–145)

## 2012-08-07 NOTE — Telephone Encounter (Signed)
Appt made and printed for pt,email to mw for zometa and  and pt is aware that it will be added

## 2012-08-07 NOTE — Progress Notes (Signed)
[facility]    OFFICE PROGRESS NOTE   INTERVAL HISTORY:   He returns as scheduled. He is taking Revlimid daily. Mild tingling at the feet. He has noted frequent bowel movements for several months. No nausea or other gastrointestinal symptoms. No recent infection. No other complaint.  Objective:  Vital signs in last 24 hours:  Blood pressure 145/84, pulse 77, temperature 99.3 F (37.4 C), temperature source Oral, resp. rate 18, height 6' (1.829 m), weight 220 lb 6.4 oz (99.973 kg).    HEENT: No thrush Resp: Lungs clear bilaterally Cardio: Regular rate and rhythm GI: Nontender, no hepatosplenomegaly Vascular: No leg edema   Lab Results:  Lab Results  Component Value Date   WBC 4.4 08/07/2012   HGB 14.2 08/07/2012   HCT 39.6 08/07/2012   MCV 95.4 08/07/2012   PLT 152 08/07/2012   ANC 2.5 Potassium 3.8, creatinine 1.1    Medications: I have reviewed the patient's current medications.  Assessment/Plan: 1. Multiple myeloma-status post high-dose chemotherapy with autologous stem cell support in November of 2004. The sero monoclonal protein was slowly rising and a restaging bone marrow biopsy in November 2009 confirmed 10 to 20% plasma cells. The serum light chains were elevated 07/25/2008. He completed 12 cycles of salvage therapy with Revlimid/Decadron with the last cycle initiated 06/16/2009. On 07/13/2009 the serum free light chains were normal and a serum protein electrophoresis/immunofixation revealed no monoclonal protein. Elevated serum M spike and serum free kappa light chains March 2013. He began cycle 1 of Revlimid 25 mg daily x21 days followed by a 7 day break on 09/27/2011. He began a second cycle of salvage Revlimid beginning on 10/25/2011. -The serum M spike was stable and the serum free kappa light chains were slightly more elevated on 11/19/2011. -He began another cycle of Revlimid/Decadron on 11/22/2011. Velcade was added beginning on 11/29/2011. He  completed 3 cycles of Revlimid/Velcade/Decadron with improvement in the serum M spike/serum free kappa light chains. The last cycle was initiated on 01/20/2012. He received melphalan 200 mg per meter squared on 03/17/2012 with autologous stem cell support on 03/18/2012. -Serum immunofixation confirmed a monoclonal IgG kappa protein, too low to quantify and a mild elevation of the serum free kappa light chains on 05/19/2012. -He began maintenance Revlimid on 07/11/2012 2. A serum immunofixation confirmed a monoclonal protein and the serum Kappa and Lambda light chains were mildly elevated in June of 2012.  3. History of elevated liver enzymes. 4. Right lung pneumonia June, 2009 -- resolved after treatment with Bactrim and azithromycin. 5. 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 He continues to have intermittent numbness in the feet. 6. History of hypokalemia -he is no longer taking a potassium supplement 7. Pneumonia 01/06/2009 -- requiring hospitalization 01/10/2011 -- status post broad spectrum antibiotic therapy with clinical resolution. 8. Upper respiratory infection in February, 2011 -- likely a viral URI.  9. Right shoulder/upper arm pain. Plain x-ray of the right shoulder/humerus on 10/16/2011 showed 2 small lytic lesions at the proximal humeral shaft suspicious for myeloma involvement. MRI of the right shoulder 10/27/2011 confirmed multiple myelomatous lesions involving the proximal right humerus, scapula, and ribs. A large destructive lesion was noted in the scapular body with an associated soft tissue component. He completed palliative radiation to the right scapula on 11/19/2011 with partial improvement in the shoulder discomfort. 10. "Hemorrhoid" discomfort and urinary hesitancy on 11/15/2011. He appeared to have an ulcer or fissure at  the anal verge. Symptoms have resolved. 11. Fever/sepsis/bacteremia/question pneumonia September 2013  following stem cell therapy.  Disposition:  He appears stable. The plan is to continue maintenance Revlimid. We will followup on the serum protein electrophoresis and serum free light chains from today. He will return for a CBC in 3 weeks. He is scheduled for an office visit, bone survey, and Zometa on 09/18/2012.   The frequent bowel movements may be related to Revlimid. He plans to try a probiotic.   Thornton Papas, MD  08/07/2012  9:24 AM

## 2012-08-07 NOTE — Telephone Encounter (Signed)
Per staff message and POF I have scheduled appts.  JMW  

## 2012-08-11 LAB — PROTEIN ELECTROPHORESIS, SERUM
Albumin ELP: 60.3 % (ref 55.8–66.1)
Alpha-1-Globulin: 3.9 % (ref 2.9–4.9)
Alpha-2-Globulin: 11.8 % (ref 7.1–11.8)
Total Protein, Serum Electrophoresis: 7 g/dL (ref 6.0–8.3)

## 2012-08-11 LAB — KAPPA/LAMBDA LIGHT CHAINS
Kappa free light chain: 3.04 mg/dL — ABNORMAL HIGH (ref 0.33–1.94)
Kappa:Lambda Ratio: 1.33 (ref 0.26–1.65)
Lambda Free Lght Chn: 2.29 mg/dL (ref 0.57–2.63)

## 2012-08-12 ENCOUNTER — Encounter: Payer: Self-pay | Admitting: *Deleted

## 2012-08-12 ENCOUNTER — Telehealth: Payer: Self-pay | Admitting: *Deleted

## 2012-08-12 NOTE — Telephone Encounter (Signed)
Message copied by Caleb Popp on Wed Aug 12, 2012  1:10 PM ------      Message from: Ladene Artist      Created: Tue Aug 11, 2012  9:45 PM       Please call patient, m-spike is better,lt.chains are stable

## 2012-08-28 ENCOUNTER — Other Ambulatory Visit (HOSPITAL_BASED_OUTPATIENT_CLINIC_OR_DEPARTMENT_OTHER): Payer: BC Managed Care – PPO | Admitting: Lab

## 2012-08-28 ENCOUNTER — Ambulatory Visit (HOSPITAL_BASED_OUTPATIENT_CLINIC_OR_DEPARTMENT_OTHER): Payer: BC Managed Care – PPO

## 2012-08-28 ENCOUNTER — Ambulatory Visit (HOSPITAL_BASED_OUTPATIENT_CLINIC_OR_DEPARTMENT_OTHER): Payer: BC Managed Care – PPO | Admitting: Oncology

## 2012-08-28 ENCOUNTER — Telehealth: Payer: Self-pay | Admitting: *Deleted

## 2012-08-28 VITALS — BP 147/94 | HR 92 | Temp 97.7°F | Resp 20 | Ht 72.0 in | Wt 211.6 lb

## 2012-08-28 DIAGNOSIS — C9000 Multiple myeloma not having achieved remission: Secondary | ICD-10-CM

## 2012-08-28 DIAGNOSIS — R197 Diarrhea, unspecified: Secondary | ICD-10-CM

## 2012-08-28 DIAGNOSIS — R5381 Other malaise: Secondary | ICD-10-CM

## 2012-08-28 DIAGNOSIS — R634 Abnormal weight loss: Secondary | ICD-10-CM

## 2012-08-28 LAB — COMPREHENSIVE METABOLIC PANEL (CC13)
AST: 22 U/L (ref 5–34)
Albumin: 4 g/dL (ref 3.5–5.0)
BUN: 14.3 mg/dL (ref 7.0–26.0)
CO2: 21 mEq/L — ABNORMAL LOW (ref 22–29)
Calcium: 8.5 mg/dL (ref 8.4–10.4)
Chloride: 103 mEq/L (ref 98–107)
Creatinine: 1.1 mg/dL (ref 0.7–1.3)
Glucose: 103 mg/dl — ABNORMAL HIGH (ref 70–99)
Potassium: 3.4 mEq/L — ABNORMAL LOW (ref 3.5–5.1)

## 2012-08-28 LAB — CBC WITH DIFFERENTIAL/PLATELET
BASO%: 0.8 % (ref 0.0–2.0)
Eosinophils Absolute: 0.4 10*3/uL (ref 0.0–0.5)
HCT: 38.8 % (ref 38.4–49.9)
LYMPH%: 23.1 % (ref 14.0–49.0)
MONO#: 0.2 10*3/uL (ref 0.1–0.9)
NEUT#: 1.6 10*3/uL (ref 1.5–6.5)
NEUT%: 52.8 % (ref 39.0–75.0)
Platelets: 176 10*3/uL (ref 140–400)
WBC: 2.9 10*3/uL — ABNORMAL LOW (ref 4.0–10.3)
lymph#: 0.7 10*3/uL — ABNORMAL LOW (ref 0.9–3.3)

## 2012-08-28 MED ORDER — DIPHENOXYLATE-ATROPINE 2.5-0.025 MG PO TABS: ORAL_TABLET | ORAL | Status: DC

## 2012-08-28 MED ORDER — POTASSIUM CHLORIDE CRYS ER 20 MEQ PO TBCR: 20.0000 meq | EXTENDED_RELEASE_TABLET | Freq: Every day | ORAL | Status: DC

## 2012-08-28 NOTE — Patient Instructions (Addendum)
Take potassium daily while having diarrhea, use imodium  Up to 8 doses per day for diarrhea  Hold Revlimid  Call 2/17 with report on diarrhea

## 2012-08-28 NOTE — Telephone Encounter (Signed)
Reports diarrhea for at least a week and nausea w/vomiting. No fever. Feels "terrible" and not getting better. Asking to be seen today. Instructed her to bring him in for lab and work-in visit.

## 2012-08-28 NOTE — Progress Notes (Signed)
[facility]    OFFICE PROGRESS NOTE   INTERVAL HISTORY:   He presents for an unscheduled visit. He developed diarrhea approximately 1 week ago. There is associated "gas "discomfort in the abdomen. He also has a sore throat, hoarseness, and sinus drainage. No shortness of breath or fever. He had an episode of nausea after eating on 08/27/2012. He complains of malaise. He is tolerating fluids. He has taken 2 Imodium tablets per day. He continues Revlimid.   Objective:  Vital signs in last 24 hours:  Blood pressure 147/94, pulse 92, temperature 97.7 F (36.5 C), temperature source Oral, resp. rate 20, height 6' (1.829 m), weight 211 lb 9.6 oz (95.981 kg).    HEENT: No sinus tenderness. Pharynx without erythema or exudate. Resp: end inspiratory fine rales at the extreme posterior base bilaterally, otherwise clear, good air movement bilaterally Cardio: Regular rate and rhythm GI: Nontender, no hepatosplenomegaly, bowel sounds are present Vascular: No leg edema   Lab Results:  Lab Results  Component Value Date   WBC 2.9* 08/28/2012   HGB 13.7 08/28/2012   HCT 38.8 08/28/2012   MCV 94.9 08/28/2012   PLT 176 08/28/2012   ANC 1.6 Potassium 3.4, BUN 14.3, creatinine 1.1, bilirubin 1.01   Medications: I have reviewed the patient's current medications.  Assessment/Plan: 1. Multiple myeloma-status post high-dose chemotherapy with autologous stem cell support in November of 2004. The sero monoclonal protein was slowly rising and a restaging bone marrow biopsy in November 2009 confirmed 10 to 20% plasma cells. The serum light chains were elevated 07/25/2008. He completed 12 cycles of salvage therapy with Revlimid/Decadron with the last cycle initiated 06/16/2009. On 07/13/2009 the serum free light chains were normal and a serum protein electrophoresis/immunofixation revealed no monoclonal protein. Elevated serum M spike and serum free kappa light chains March 2013. He began  cycle 1 of Revlimid 25 mg daily x21 days followed by a 7 day break on 09/27/2011. He began a second cycle of salvage Revlimid beginning on 10/25/2011. -The serum M spike was stable and the serum free kappa light chains were slightly more elevated on 11/19/2011. -He began another cycle of Revlimid/Decadron on 11/22/2011. Velcade was added beginning on 11/29/2011. He completed 3 cycles of Revlimid/Velcade/Decadron with improvement in the serum M spike/serum free kappa light chains. The last cycle was initiated on 01/20/2012. He received melphalan 200 mg per meter squared on 03/17/2012 with autologous stem cell support on 03/18/2012. -Serum immunofixation confirmed a monoclonal IgG kappa protein, too low to quantify and a mild elevation of the serum free kappa light chains on 05/19/2012.                                                                                                                                                                         -  He began maintenance Revlimid on 07/11/2012                                                                                                                                            -serum M spike 0.25, serum free kappa light chains 3.04 on 08/07/2012 2. A serum immunofixation confirmed a monoclonal protein and the serum Kappa and Lambda light chains were mildly elevated in June of 2012.  3. History of elevated liver enzymes. 4. Right lung pneumonia June, 2009 -- resolved after treatment with Bactrim and azithromycin. 5. 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 He continues to have intermittent numbness in the feet. 6. History of hypokalemia -he is no longer taking a potassium supplement 7. Pneumonia 01/06/2009 -- requiring hospitalization 01/10/2011 -- status post broad spectrum antibiotic therapy with clinical resolution. 8. Upper respiratory infection in February, 2011 -- likely a viral URI.   9. Right shoulder/upper arm pain. Plain x-ray of the right shoulder/humerus on 10/16/2011 showed 2 small lytic lesions at the proximal humeral shaft suspicious for myeloma involvement. MRI of the right shoulder 10/27/2011 confirmed multiple myelomatous lesions involving the proximal right humerus, scapula, and ribs. A large destructive lesion was noted in the scapular body with an associated soft tissue component. He completed palliative radiation to the right scapula on 11/19/2011 with partial improvement in the shoulder discomfort. 10. "Hemorrhoid" discomfort and urinary hesitancy on 11/15/2011. He appeared to have an ulcer or fissure at the anal verge. Symptoms have resolved. 11. Fever/sepsis/bacteremia/question pneumonia September 2013 following stem cell therapy. 12. Sore throat/sinus drainage/diarrhea-most likely a systemic viral infection   Disposition:  He presents today for an unscheduled visit. He complains of malaise, diarrhea, and sinus drainage. I suspect he has a viral infection. He has lost weight over the past few weeks. I encouraged him to push fluids. He will take up to 8 Imodium tablets per day for diarrhea. I doubt the Revlimid is causing the diarrhea, but he will hold the Revlimid for the next several days. He will take potassium once daily while having diarrhea.  Mr. Daisey will contact us on 08/31/2012 with a report on his symptoms. He will return for a scheduled office visit and Zometa 09/18/2012.   Thornton Papas, MD  08/28/2012  12:58 PM

## 2012-08-28 NOTE — Addendum Note (Signed)
Addended by: Wandalee Ferdinand on: 08/28/2012 02:21 PM   Modules accepted: Orders

## 2012-08-31 ENCOUNTER — Other Ambulatory Visit: Payer: Self-pay | Admitting: *Deleted

## 2012-08-31 DIAGNOSIS — C9000 Multiple myeloma not having achieved remission: Secondary | ICD-10-CM

## 2012-08-31 MED ORDER — LENALIDOMIDE 10 MG PO CAPS: 10.0000 mg | ORAL_CAPSULE | Freq: Every day | ORAL | Status: DC

## 2012-09-02 ENCOUNTER — Telehealth: Payer: Self-pay | Admitting: *Deleted

## 2012-09-02 NOTE — Telephone Encounter (Signed)
Patient left VM that he is doing better. No BM on Sat or Sunday. Normal BM 2/18. Sinus symptoms improved. No fever. Dr. Truett Perna notified. OK to resume Revlimid.

## 2012-09-02 NOTE — Telephone Encounter (Signed)
Left VM that message was received and if he is continuing to do well, he may resume his Revlimid.

## 2012-09-11 ENCOUNTER — Other Ambulatory Visit: Payer: Self-pay | Admitting: *Deleted

## 2012-09-11 ENCOUNTER — Ambulatory Visit (HOSPITAL_BASED_OUTPATIENT_CLINIC_OR_DEPARTMENT_OTHER): Payer: BC Managed Care – PPO | Admitting: Nurse Practitioner

## 2012-09-11 ENCOUNTER — Ambulatory Visit (HOSPITAL_COMMUNITY)
Admission: RE | Admit: 2012-09-11 | Discharge: 2012-09-11 | Disposition: A | Discharge status: Home / Self Care | Payer: BC Managed Care – PPO | Source: Ambulatory Visit | Attending: Oncology | Admitting: Oncology

## 2012-09-11 ENCOUNTER — Telehealth: Payer: Self-pay | Admitting: *Deleted

## 2012-09-11 ENCOUNTER — Ambulatory Visit (HOSPITAL_BASED_OUTPATIENT_CLINIC_OR_DEPARTMENT_OTHER): Payer: BC Managed Care – PPO | Admitting: Lab

## 2012-09-11 DIAGNOSIS — R509 Fever, unspecified: Secondary | ICD-10-CM | POA: Insufficient documentation

## 2012-09-11 DIAGNOSIS — R05 Cough: Secondary | ICD-10-CM

## 2012-09-11 DIAGNOSIS — R059 Cough, unspecified: Secondary | ICD-10-CM | POA: Insufficient documentation

## 2012-09-11 DIAGNOSIS — C9000 Multiple myeloma not having achieved remission: Secondary | ICD-10-CM

## 2012-09-11 DIAGNOSIS — J3489 Other specified disorders of nose and nasal sinuses: Secondary | ICD-10-CM | POA: Insufficient documentation

## 2012-09-11 LAB — CBC WITH DIFFERENTIAL/PLATELET
Basophils Absolute: 0 10*3/uL (ref 0.0–0.1)
EOS%: 7 % (ref 0.0–7.0)
Eosinophils Absolute: 0.3 10*3/uL (ref 0.0–0.5)
HCT: 33.9 % — ABNORMAL LOW (ref 38.4–49.9)
HGB: 12 g/dL — ABNORMAL LOW (ref 13.0–17.1)
LYMPH%: 11.6 % — ABNORMAL LOW (ref 14.0–49.0)
MCH: 33.6 pg — ABNORMAL HIGH (ref 27.2–33.4)
MCV: 95 fL (ref 79.3–98.0)
MONO%: 6.2 % (ref 0.0–14.0)
NEUT#: 3.1 10*3/uL (ref 1.5–6.5)
NEUT%: 74.3 % (ref 39.0–75.0)
Platelets: 153 10*3/uL (ref <2.0)
RDW: 15 % — ABNORMAL HIGH (ref 11.0–14.6)

## 2012-09-11 MED ORDER — AZITHROMYCIN 250 MG PO TABS: ORAL_TABLET | ORAL | Status: DC

## 2012-09-11 NOTE — Progress Notes (Signed)
Open in error

## 2012-09-11 NOTE — Progress Notes (Signed)
OFFICE PROGRESS NOTE  Interval history:  Chad [name] returns prior to scheduled followup. He reports persistent sinus symptoms over the past 3-4 weeks. Yesterday he noted an increased cough. The cough is intermittently productive with dark brown/green phlegm expectorated. His temperature this morning was 101.9. He has not taken anything to lower the temperature. No shaking chills. He denies shortness of breath. No chest pain. He has a sore throat which he attributes to the cough. No hematuria or dysuria. The diarrhea he was experiencing 2 weeks ago has resolved.   Objective: There were no vitals taken for this visit.  Oropharynx is without thrush or ulceration. Posterior pharynx is without erythema or exudate. Lungs are clear. No wheezes or rales. No respiratory distress. Regular cardiac rhythm. Abdomen is soft and nontender. No organomegaly. Extremities are without edema.  Lab Results: Lab Results  Component Value Date   WBC 4.2 09/11/2012   HGB 12.0* 09/11/2012   HCT 33.9* 09/11/2012   MCV 95.0 09/11/2012   PLT 153 09/11/2012    Chemistry:    Chemistry      Component Value Date/Time   NA 137 08/28/2012 1027   NA 139 07/31/2012 1057   K 3.4* 08/28/2012 1027   K 4.2 07/31/2012 1057   CL 103 08/28/2012 1027   CL 104 07/31/2012 1057   CO2 21* 08/28/2012 1027   CO2 26 07/31/2012 1057   BUN 14.3 08/28/2012 1027   BUN 11 07/31/2012 1057   CREATININE 1.1 08/28/2012 1027   CREATININE 1.0 07/31/2012 1057      Component Value Date/Time   CALCIUM 8.5 08/28/2012 1027   CALCIUM 9.8 07/31/2012 1057   ALKPHOS 62 08/28/2012 1027   ALKPHOS 48 07/31/2012 1057   AST 22 08/28/2012 1027   AST 21 07/31/2012 1057   ALT 33 08/28/2012 1027   ALT 30 07/31/2012 1057   BILITOT 1.01 08/28/2012 1027   BILITOT 1.0 07/31/2012 1057       Studies/Results: Dg Chest 2 View  09/11/2012  *RADIOLOGY REPORT*  Clinical Data: Fever and cough.  Head congestion.  CHEST - 2 VIEW  Comparison: 07/31/2012  Findings: Heart size and  pulmonary vascularity are normal and the lungs are clear.  No acute osseous abnormality.  IMPRESSION: No acute disease.   Original Report Authenticated By: Francene Boyers, M.D.     Medications: I have reviewed the patient's current medications.  Assessment/Plan:  1. Multiple myeloma-status post high-dose chemotherapy with autologous stem cell support in November of 2004. The sero monoclonal protein was slowly rising and a restaging bone marrow biopsy in November 2009 confirmed 10 to 20% plasma cells. The serum light chains were elevated 07/25/2008. He completed 12 cycles of salvage therapy with Revlimid/Decadron with the last cycle initiated 06/16/2009. On 07/13/2009 the serum free light chains were normal and a serum protein electrophoresis/immunofixation revealed no monoclonal protein. Elevated serum M spike and serum free kappa light chains March 2013. He began cycle 1 of Revlimid 25 mg daily x21 days followed by a 7 day break on 09/27/2011. He began a second cycle of salvage Revlimid beginning on 10/25/2011. The serum M spike was stable and the serum free kappa light chains were slightly more elevated on 11/19/2011. He began another cycle of Revlimid/Decadron on 11/22/2011. Velcade was added beginning on 11/29/2011. He completed 3 cycles of Revlimid/Velcade/Decadron with improvement in the serum M spike/serum free kappa light chains. The last cycle was initiated on 01/20/2012. He received melphalan 200 mg per meter squared on 03/17/2012 with autologous  stem cell support on 03/18/2012. Serum immunofixation confirmed a monoclonal IgG kappa protein, too low to quantify and a mild elevation of the serum free kappa light chains on 05/19/2012. He began maintenance Revlimid on 07/11/2012. Serum M spike 0.25, serum free kappa light chains 3.04 on 08/07/2012. He continues maintenance Revlimid. 2. A serum immunofixation confirmed a monoclonal protein and the serum Kappa and Lambda light chains were mildly elevated  in June of 2012.  3. History of elevated liver enzymes. 4. Right lung pneumonia June, 2009 -- resolved after treatment with Bactrim and azithromycin. 5. 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 He continues to have intermittent numbness in the feet. 6. History of hypokalemia -he is no longer taking a potassium supplement 7. Pneumonia 01/06/2009 -- requiring hospitalization 01/10/2011 -- status post broad spectrum antibiotic therapy with clinical resolution. 8. Upper respiratory infection in February, 2011 -- likely a viral URI.  9. Right shoulder/upper arm pain. Plain x-ray of the right shoulder/humerus on 10/16/2011 showed 2 small lytic lesions at the proximal humeral shaft suspicious for myeloma involvement. MRI of the right shoulder 10/27/2011 confirmed multiple myelomatous lesions involving the proximal right humerus, scapula, and ribs. A large destructive lesion was noted in the scapular body with an associated soft tissue component. He completed palliative radiation to the right scapula on 11/19/2011 with partial improvement in the shoulder discomfort. 10. "Hemorrhoid" discomfort and urinary hesitancy on 11/15/2011. He appeared to have an ulcer or fissure at the anal verge. Symptoms have resolved. 11. Fever/sepsis/bacteremia/question pneumonia September 2013 following stem cell therapy. 12. Sore throat/sinus drainage/diarrhea 08/28/2012 most likely a systemic viral infection. The diarrhea has resolved. The sinus symptoms have persisted. 13. Increased cough, fever. Temperature in the office is 99.5 (he has taken no medication to lower the temperature). Chest x-ray negative. Question bacterial sinus infection.  Disposition-Chad [name] presents today for an unscheduled visit. He has an increased cough and had a fever at home. He does not appear acutely ill. Chest x-ray is negative. He may have a bacterial sinus infection. He will complete a  Z-Pak. He and his wife understand to seek evaluation in the emergency department over the weekend with any change in his condition. Otherwise they will contact the office on 09/14/2012 with an update on his condition.  Plan reviewed with Dr. Truett Perna.    Lonna Cobb ANP/GNP-BC

## 2012-09-11 NOTE — Telephone Encounter (Signed)
Received message from pt stating "running a fever and coughing; can I be seen today?'  Returned call and spoke with family member stating pt is running a fever of 100.9 and has a deep cough.  Per Dr. Truett Perna instructed pt that Lonna Cobb, NP will see pt today at 1:45 and have labs & CXR before.  Pt verbalized understanding of instructions.

## 2012-09-14 ENCOUNTER — Telehealth: Payer: Self-pay | Admitting: *Deleted

## 2012-09-14 NOTE — Telephone Encounter (Signed)
Reports cough continues but improved. Still with sinus congestion. Fever 102-103 Friday and Saturday. Normal temp Sunday. Temp up to 101.1 today. Still on antibiotic. Overall, feels a little better-not worse. Dr. Truett Perna made aware and said to call office if he continues to spike fevers next 24-48 hours or symptoms become worse.

## 2012-09-16 ENCOUNTER — Inpatient Hospital Stay (HOSPITAL_COMMUNITY)
Admission: AD | Admit: 2012-09-16 | Discharge: 2012-09-27 | DRG: 089 | Disposition: A | Discharge status: Home / Self Care | Payer: BC Managed Care – PPO | Source: Ambulatory Visit | Attending: Oncology | Admitting: Oncology

## 2012-09-16 ENCOUNTER — Telehealth: Payer: Self-pay | Admitting: *Deleted

## 2012-09-16 ENCOUNTER — Ambulatory Visit (HOSPITAL_BASED_OUTPATIENT_CLINIC_OR_DEPARTMENT_OTHER): Payer: BC Managed Care – PPO | Admitting: Lab

## 2012-09-16 ENCOUNTER — Ambulatory Visit: Payer: BC Managed Care – PPO | Admitting: Nurse Practitioner

## 2012-09-16 ENCOUNTER — Encounter (HOSPITAL_COMMUNITY): Payer: Self-pay | Admitting: *Deleted

## 2012-09-16 ENCOUNTER — Inpatient Hospital Stay (HOSPITAL_COMMUNITY): Payer: BC Managed Care – PPO

## 2012-09-16 DIAGNOSIS — Z9221 Personal history of antineoplastic chemotherapy: Secondary | ICD-10-CM

## 2012-09-16 DIAGNOSIS — J189 Pneumonia, unspecified organism: Secondary | ICD-10-CM

## 2012-09-16 DIAGNOSIS — J101 Influenza due to other identified influenza virus with other respiratory manifestations: Secondary | ICD-10-CM

## 2012-09-16 DIAGNOSIS — C9000 Multiple myeloma not having achieved remission: Secondary | ICD-10-CM

## 2012-09-16 DIAGNOSIS — D696 Thrombocytopenia, unspecified: Secondary | ICD-10-CM | POA: Diagnosis present

## 2012-09-16 DIAGNOSIS — D703 Neutropenia due to infection: Secondary | ICD-10-CM | POA: Diagnosis present

## 2012-09-16 DIAGNOSIS — E7800 Pure hypercholesterolemia, unspecified: Secondary | ICD-10-CM | POA: Diagnosis present

## 2012-09-16 DIAGNOSIS — I2510 Atherosclerotic heart disease of native coronary artery without angina pectoris: Secondary | ICD-10-CM | POA: Diagnosis present

## 2012-09-16 DIAGNOSIS — R05 Cough: Secondary | ICD-10-CM

## 2012-09-16 DIAGNOSIS — E876 Hypokalemia: Secondary | ICD-10-CM | POA: Diagnosis present

## 2012-09-16 DIAGNOSIS — J09X1 Influenza due to identified novel influenza A virus with pneumonia: Principal | ICD-10-CM | POA: Diagnosis present

## 2012-09-16 DIAGNOSIS — D702 Other drug-induced agranulocytosis: Secondary | ICD-10-CM

## 2012-09-16 DIAGNOSIS — I10 Essential (primary) hypertension: Secondary | ICD-10-CM | POA: Diagnosis present

## 2012-09-16 LAB — CBC WITH DIFFERENTIAL/PLATELET
EOS%: 0.3 % (ref 0.0–7.0)
Eosinophils Absolute: 0 10*3/uL (ref 0.0–0.5)
LYMPH%: 41.6 % (ref 14.0–49.0)
MCH: 32.8 pg (ref 27.2–33.4)
MCV: 93.8 fL (ref 79.3–98.0)
MONO%: 11.2 % (ref 0.0–14.0)
NEUT#: 0.6 10*3/uL — ABNORMAL LOW (ref 1.5–6.5)
Platelets: 92 10*3/uL — ABNORMAL LOW (ref 140–400)
RBC: 3.87 10*6/uL — ABNORMAL LOW (ref 4.20–5.82)
RDW: 14.4 % (ref 11.0–14.6)

## 2012-09-16 LAB — BASIC METABOLIC PANEL
BUN: 13 mg/dL (ref 6–23)
CO2: 22 mEq/L (ref 19–32)
Calcium: 8.5 mg/dL (ref 8.4–10.5)
Creatinine, Ser: 0.99 mg/dL (ref 0.50–1.35)
Glucose, Bld: 97 mg/dL (ref 70–99)

## 2012-09-16 LAB — URINALYSIS, MICROSCOPIC ONLY
Glucose, UA: NEGATIVE mg/dL
Leukocytes, UA: NEGATIVE
Protein, ur: 100 mg/dL — AB
Urobilinogen, UA: 0.2 mg/dL (ref 0.0–1.0)

## 2012-09-16 MED ORDER — ASPIRIN 81 MG PO CHEW
81.0000 mg | CHEWABLE_TABLET | Freq: Every day | ORAL | Status: DC
  Administered 2012-09-17: 81 mg via ORAL
  Filled 2012-09-16: qty 1

## 2012-09-16 MED ORDER — ACETAMINOPHEN 325 MG PO TABS
ORAL_TABLET | ORAL | Status: AC
  Filled 2012-09-16: qty 2

## 2012-09-16 MED ORDER — VALACYCLOVIR HCL 500 MG PO TABS
500.0000 mg | ORAL_TABLET | Freq: Every day | ORAL | Status: DC
  Administered 2012-09-17 – 2012-09-27 (×11): 500 mg via ORAL
  Filled 2012-09-16 (×11): qty 1

## 2012-09-16 MED ORDER — VANCOMYCIN HCL IN DEXTROSE 1-5 GM/200ML-% IV SOLN
1000.0000 mg | Freq: Three times a day (TID) | INTRAVENOUS | Status: DC
  Administered 2012-09-17 (×4): 1000 mg via INTRAVENOUS
  Filled 2012-09-16 (×5): qty 200

## 2012-09-16 MED ORDER — VANCOMYCIN HCL 10 G IV SOLR
1500.0000 mg | Freq: Once | INTRAVENOUS | Status: AC
  Administered 2012-09-16: 1500 mg via INTRAVENOUS
  Filled 2012-09-16: qty 1500

## 2012-09-16 MED ORDER — MORPHINE SULFATE 2 MG/ML IJ SOLN: 2.0000 mg | INTRAMUSCULAR | Status: DC | PRN

## 2012-09-16 MED ORDER — ZOLPIDEM TARTRATE 5 MG PO TABS: 5.0000 mg | ORAL_TABLET | Freq: Every evening | ORAL | Status: DC | PRN

## 2012-09-16 MED ORDER — IOHEXOL 300 MG/ML  SOLN
80.0000 mL | Freq: Once | INTRAMUSCULAR | Status: AC | PRN
  Administered 2012-09-16: 80 mL via INTRAVENOUS

## 2012-09-16 MED ORDER — DEXTROSE 5 % IV SOLN
2.0000 g | Freq: Once | INTRAVENOUS | Status: AC
  Administered 2012-09-16: 2 g via INTRAVENOUS
  Filled 2012-09-16: qty 2

## 2012-09-16 MED ORDER — DEXTROSE 5 % IV SOLN
2.0000 g | Freq: Three times a day (TID) | INTRAVENOUS | Status: DC
  Administered 2012-09-17 – 2012-09-25 (×26): 2 g via INTRAVENOUS
  Filled 2012-09-16 (×29): qty 2

## 2012-09-16 MED ORDER — PROCHLORPERAZINE MALEATE 10 MG PO TABS
10.0000 mg | ORAL_TABLET | Freq: Four times a day (QID) | ORAL | Status: DC | PRN
  Administered 2012-09-16 – 2012-09-18 (×4): 10 mg via ORAL
  Filled 2012-09-16 (×4): qty 1

## 2012-09-16 MED ORDER — SODIUM CHLORIDE 0.9 % IV SOLN
INTRAVENOUS | Status: DC
  Administered 2012-09-16 – 2012-09-17 (×2): via INTRAVENOUS

## 2012-09-16 MED ORDER — ACETAMINOPHEN 325 MG PO TABS
650.0000 mg | ORAL_TABLET | Freq: Three times a day (TID) | ORAL | Status: DC | PRN
  Administered 2012-09-16 – 2012-09-21 (×12): 650 mg via ORAL
  Filled 2012-09-16 (×12): qty 2

## 2012-09-16 MED ORDER — GUAIFENESIN ER 600 MG PO TB12
1200.0000 mg | ORAL_TABLET | ORAL | Status: DC | PRN
  Administered 2012-09-21: 1200 mg via ORAL
  Filled 2012-09-16: qty 2

## 2012-09-16 MED ORDER — FILGRASTIM 480 MCG/1.6ML IJ SOLN
480.0000 ug | INTRAMUSCULAR | Status: DC
  Administered 2012-09-16 – 2012-09-17 (×2): 480 ug via SUBCUTANEOUS
  Filled 2012-09-16 (×3): qty 1.6

## 2012-09-16 NOTE — Progress Notes (Signed)
ANTIBIOTIC CONSULT NOTE - INITIAL  Pharmacy Consult for Vancomycin, Azactam Indication: neutropenic fever  Allergies  Allergen Reactions  . Primaxin (Imipenem) Rash  . Lisinopril     REACTION: ALLERGIC to ACE w/ cough...  . Penicillins     rash  . Valsartan     REACTION: ALLERGIC to ARB w/ Urticaria \\T \ Angioedema    Patient Measurements: Height: 6' 0.05" (183 cm) (as documented 08/2012) Weight: 211 lb 10.3 oz (96 kg) (as documented 08/2012) IBW/kg (Calculated) : 77.71  Vital Signs:   Intake/Output from previous day:   Intake/Output from this shift:    Labs:  Recent Labs  09/16/12 1230  WBC 1.4*  HGB 12.7*  PLT 92*   Estimated Creatinine Clearance: 88 ml/min (by C-G formula based on Cr of 1.1). No results found for this basename: VANCOTROUGH, VANCOPEAK, VANCORANDOM, GENTTROUGH, GENTPEAK, GENTRANDOM, TOBRATROUGH, TOBRAPEAK, TOBRARND, AMIKACINPEAK, AMIKACINTROU, AMIKACIN,  in the last 72 hours   Microbiology: No results found for this or any previous visit (from the past 720 hour(s)).  Medical History: Past Medical History  Diagnosis Date  . Unspecified essential hypertension   . Coronary atherosclerosis of unspecified type of vessel, native or graft   . Pure hypercholesterolemia   . Overweight   . Benign neoplasm of colon   . Multiple myeloma, without mention of having achieved remission(203.00) 2004  . Urticaria, unspecified   . 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 Cancer 10/26/11 ct    multiple myelomatous lesion proximal right humerus,scaspula and ribs,  . History of chemotherapy 05/2003    high dose chemotherapy with autologus stem cell support/revlimid started 09/27/11  . Pneumonia 12/2007    hx  12/2007 & agian 01/06/09  . Allergy   . URI (upper respiratory infection) 08/2009    viral URI  . Neuropathy     hx intermittement tingling/numbness in feet r/t? revlimid, , resolved  . Neuropathy 10/22/11    intermittent tingling feet  again with  revlimid therapy  . H/O cardiac catheterization 07/13/02  . H/O colonoscopy 04/2007    6mm desc colon polyp =adenomatous  . 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  Pt presented to the cancer center 2/28 with persistent sinus symptoms, fever, cough and was prescribed Z-pak (unremarkable CXR). Pt completed Z-prak 3/4 but fever and non-productive cough persisted.  Pt admitted to hospital 3/5, MD ordered to start Vancomycin and Azactam for neutropenic fever.  Neupogen started.    WBC 1.4 (ANC 0.6K).  Latest Scr was 2/14 and that was wnl for CG CrCl of 88 ml/min.   Goal of Therapy:  Vancomycin trough level 15-20 mcg/ml  Plan:   STAT BMET  Vanc 1500 mg IV x 1   Azactam 2gm IV x 1   Pharmacy will f/u renal function and redose  Geoffry Paradise, PharmD, BCPS Pager: [phone] 4:27 PM Pharmacy #: [id number]

## 2012-09-16 NOTE — Telephone Encounter (Signed)
Per Dr. Truett Perna, needs to be seen in office now and with possible admission. Patient coming in now.

## 2012-09-16 NOTE — Progress Notes (Signed)
ANTIBIOTIC CONSULT NOTE - brief  Pharmacy Consult for Vancomycin, Azactam Indication: neutropenic fever  Labs:  Recent Labs  09/16/12 1230 09/16/12 1637  WBC 1.4*  --   HGB 12.7*  --   PLT 92*  --   CREATININE  --  0.99   Estimated Creatinine Clearance: 97.8 ml/min (by C-G formula based on Cr of 0.99). No results found for this basename: VANCOTROUGH, VANCOPEAK, VANCORANDOM, GENTTROUGH, GENTPEAK, GENTRANDOM, TOBRATROUGH, TOBRAPEAK, TOBRARND, AMIKACINPEAK, AMIKACINTROU, AMIKACIN,  in the last 72 hours   Microbiology: No results found for this or any previous visit (from the past 720 hour(s)).  Assessment: SCr result is wnl, CrCl ~100.  Goal of Therapy:  Vancomycin trough level 15-20 mcg/ml  Plan:   Vanc 1g IV q8h.  Azactam 2g IV q8h. Measure Vanc trough at steady state. Follow up renal fxn and culture results.  Charolotte Eke, PharmD, pager [phone]. 09/16/2012,5:48 PM.

## 2012-09-16 NOTE — Progress Notes (Signed)
Mr. Henard is being admitted to the hospital. Please see the admission history and physical for details

## 2012-09-16 NOTE — Telephone Encounter (Signed)
@   0830 Left VM that he had fever 102-103 yesterday and continues today at 101.5 even after Tylenol.

## 2012-09-16 NOTE — H&P (Signed)
Admission History and Physical      Chief Complaint: Fever and cough. HPI: Chad [name] is a 57 year old man with long-standing multiple myeloma. He is status post high-dose chemotherapy with autologous stem cell support in November of 2004. The serum monoclonal protein was slowly rising and a restaging bone marrow biopsy in November 2009 confirmed 10 to 20% plasma cells. The serum light chains were elevated 07/25/2008. He completed 12 cycles of salvage therapy with Revlimid/Decadron with the last cycle initiated 06/16/2009. On 07/13/2009 the serum free light chains were normal and a serum protein electrophoresis/immunofixation revealed no monoclonal protein. Elevated serum M spike and serum free kappa light chains March 2013. He began cycle 1 of Revlimid 25 mg daily x21 days followed by a 7 day break on 09/27/2011. He began a second cycle of salvage Revlimid beginning on 10/25/2011. The serum M spike was stable and the serum free kappa light chains were slightly more elevated on 11/19/2011. He began another cycle of Revlimid/Decadron on 11/22/2011. Velcade was added beginning on 11/29/2011. He completed 3 cycles of Revlimid/Velcade/Decadron with improvement in the serum M spike/serum free kappa light chains. The last cycle was initiated on 01/20/2012. He received melphalan 200 mg per meter squared on 03/17/2012 with autologous stem cell support on 03/18/2012. Posttransplant course was complicated by fever/sepsis requiring ICU admission at [facility]. Serum immunofixation confirmed a monoclonal IgG kappa protein, too low to quantify and a mild elevation of the serum free kappa light chains on 05/19/2012. He began maintenance Revlimid on 07/11/2012. Serum M spike 0.25, serum free kappa light chains 3.04 on 08/07/2012. He has continued maintenance Revlimid at a dose of 10 mg daily.  Chad [name] was seen for an unscheduled visit on 09/11/2012 for persistent sinus symptoms, increased cough and fever. Chest x-ray was  unremarkable. He completed a Z-Pak.  The fevers initially improved but never normalized. Over the past 24 hours he has had persistent fevers to 102-103. He is taking Tylenol with minimal improvement. Last night he utilized ice packs. He finished the Z-Pak yesterday. He has a persistent nonproductive cough. He denies shortness of breath. He has had some chills. He notes increased weakness and a poor appetite. He has had some mild nausea. No vomiting. He has had some loose stools for the past few days 2 loose stools thus far today. He denies any pain over the sinuses. No dysuria. No rectal discomfort.   Past Medical History  Diagnosis Date  . Unspecified essential hypertension   . Coronary atherosclerosis of unspecified type of vessel, native or graft   . Pure hypercholesterolemia   . Overweight   . Benign neoplasm of colon   . Multiple myeloma, without mention of having achieved remission(203.00) 2004  . Urticaria, unspecified   . 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 Cancer 10/26/11 ct    multiple myelomatous lesion proximal right humerus,scaspula and ribs,  . History of chemotherapy 05/2003    high dose chemotherapy with autologus stem cell support/revlimid started 09/27/11  . Pneumonia 12/2007    hx  12/2007 & agian 01/06/09  . Allergy   . URI (upper respiratory infection) 08/2009    viral URI  . Neuropathy     hx intermittement tingling/numbness in feet r/t? revlimid, , resolved  . Neuropathy 10/22/11    intermittent tingling feet  again with revlimid therapy  . H/O cardiac catheterization 07/13/02  . H/O colonoscopy 04/2007    6mm desc colon polyp =adenomatous  . 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  No prescriptions prior to admission     Allergies  Allergen Reactions  . Primaxin (Imipenem) Rash  . Lisinopril     REACTION: ALLERGIC to ACE w/ cough...  . Penicillins     rash  . Valsartan     REACTION: ALLERGIC to ARB w/ Urticaria \\T \ Angioedema    Family history: Father recently deceased with non-Hodgkin's lymphoma.  Social history: He lives in [place]. He is married. He has 2 daughters. No alcohol or tobacco use.  ROS: In addition to the review of systems noted in the history of present illness he denies any unusual headaches. No vision change. No falls or balance problems. He denies bleeding. No skin rash.  Physical: Temperature 101.3, heart rate 96, blood pressure 176/79, weight 205.9 pounds.  General: Fatigued appearing man in no acute distress. HEENT: Pupils equal round and reactive to light. Extraocular movements intact. Sclera anicteric. Oropharynx is without thrush or ulceration. Nasal passages unremarkable. Chest: Question very faint rales at the left lung base and right mid lung field. No respiratory distress. Cardiovascular: Regular cardiac rhythm. Abdomen: Abdomen is soft and nontender. Nondistended. No organomegaly. Bowel sounds active. Extremities: No edema. Calves nontender. Neuro: He is alert and oriented. Follows commands. Motor strength 5 over 5.  Labs: Hemoglobin 12.7, total white count 1.4, absolute neutrophil count 0.6, platelet count 92,000.   Assessment/Plan 1. Long-standing multiple myeloma with previous treatment as outlined above. He is status post melphalan 200 mg per meter squared on 03/17/2012 with autologous stem cell support on 03/18/2012. He began maintenance Revlimid on 07/11/2012. 2. Neutropenia/thrombocytopenia likely multifactorial secondary to Revlimid and acute illness. 3. Persistent fever, cough with negative chest x-ray on 09/11/2012; status post Z-Pak beginning 09/11/2012. 4. Loose stools. 5. Hospitalization with fever/sepsis at [facility] 03/25/2012.  Mr.  [name] is a 57 year old man with long-standing multiple myeloma. He is significantly immunocompromised due to the myeloma and previous/current treatment. He is having persistent fevers and cough of unclear etiology with a negative chest x-ray on 09/11/2012. He has had pneumonia multiple times in the past.  He is being admitted to the hospital for initiation of broad-spectrum antibiotic coverage with Aztreonam and will also begin vancomycin. We will obtain cultures including blood and urine and a chest CT. Testing for C. difficile will also be done. Given the neutropenia he will be started on Neupogen 480 mcg subcutaneous daily. Antifungal coverage will be considered once the chest CT result is available.   Patient interviewed and examined by Dr. Gaylyn Rong; plan per Dr. Gaylyn Rong.  Lonna Cobb ANP/GNP-BC 09/16/2012, 2:42 PM

## 2012-09-16 NOTE — H&P (Signed)
I personally reviewed his history, examined patient, and formulated the plan as detailed.

## 2012-09-17 LAB — CBC WITH DIFFERENTIAL/PLATELET
Basophils Relative: 0 % (ref 0–1)
Eosinophils Relative: 2 % (ref 0–5)
Hemoglobin: 10.8 g/dL — ABNORMAL LOW (ref 13.0–17.0)
Lymphs Abs: 0.2 10*3/uL — ABNORMAL LOW (ref 0.7–4.0)
MCH: 33.1 pg (ref 26.0–34.0)
MCV: 92 fL (ref 78.0–100.0)
Monocytes Absolute: 0.1 10*3/uL (ref 0.1–1.0)
Neutro Abs: 2.2 10*3/uL (ref 1.7–7.7)
Platelets: 65 10*3/uL — ABNORMAL LOW (ref 150–400)
RBC: 3.26 MIL/uL — ABNORMAL LOW (ref 4.22–5.81)

## 2012-09-17 LAB — CBC
MCH: 32.9 pg (ref 26.0–34.0)
MCHC: 36 g/dL (ref 30.0–36.0)
MCV: 91.5 fL (ref 78.0–100.0)
Platelets: 70 10*3/uL — ABNORMAL LOW (ref 150–400)
RBC: 3.43 MIL/uL — ABNORMAL LOW (ref 4.22–5.81)
RDW: 14.2 % (ref 11.5–15.5)

## 2012-09-17 LAB — BASIC METABOLIC PANEL
BUN: 10 mg/dL (ref 6–23)
CO2: 23 mEq/L (ref 19–32)
Calcium: 7.7 mg/dL — ABNORMAL LOW (ref 8.4–10.5)
Creatinine, Ser: 1.1 mg/dL (ref 0.50–1.35)
Glucose, Bld: 98 mg/dL (ref 70–99)

## 2012-09-17 MED ORDER — LORAZEPAM 1 MG PO TABS
1.0000 mg | ORAL_TABLET | Freq: Every evening | ORAL | Status: DC | PRN
  Administered 2012-09-17: 1 mg via ORAL
  Filled 2012-09-17: qty 1

## 2012-09-17 MED ORDER — BOOST / RESOURCE BREEZE PO LIQD
1.0000 | Freq: Three times a day (TID) | ORAL | Status: DC
  Administered 2012-09-17 – 2012-09-27 (×23): 1 via ORAL

## 2012-09-17 MED ORDER — SODIUM CHLORIDE 0.9 % IV SOLN
INTRAVENOUS | Status: DC
  Administered 2012-09-17 – 2012-09-22 (×4): via INTRAVENOUS
  Filled 2012-09-17 (×12): qty 1000

## 2012-09-17 NOTE — Progress Notes (Signed)
INITIAL NUTRITION ASSESSMENT  Pt meets criteria for severe MALNUTRITION in the context of acute illness as evidenced by <50% estimated energy intake in the past week with 5.4% weight loss in the past 2 weeks.  DOCUMENTATION CODES Per approved criteria  -Severe malnutrition in the context of acute illness or injury   INTERVENTION: - Resource Breeze TID - Encouraged small frequent meals to improve appetite. Discussed ways to flavor food and strategies to help with dry mouth. - Recommend MD order Biotene to help with dry mouth - Will continue to monitor   NUTRITION DIAGNOSIS: Inadequate oral intake related to poor appetite, dry mouth, altered taste as evidenced by pt statement.   Goal: Pt to consume 100% of meals and supplements  Monitor:  Weights, labs, intake  Reason for Assessment: Nutrition risk   57 y.o. male  Admitting Dx: Fever and cough   ASSESSMENT: Pt with long-standing multiple myeloma s/p chemotherapy and autologous stem cell support. Met with pt and family who report pt with poor appetite in the past week with 12 pound unintended weight loss in the past 2 weeks. Family report for the past week pt was hardly eating anything and they would be lucky if he ate 1 sandwich in a day. Pt not on any nutritional supplements at home and states before the past week he was eating 2 meals/day. Pt reports he was able to eat some toast this morning. Pt reports recently he has had some dry mouth and that foods don't taste right.   Height: Ht Readings from Last 1 Encounters:  09/16/12 6' 0.05" (1.83 m)    Weight: Wt Readings from Last 1 Encounters:  09/16/12 211 lb 10.3 oz (96 kg)    Ideal Body Weight: 178 lb  % Ideal Body Weight: 118  Wt Readings from Last 10 Encounters:  09/16/12 211 lb 10.3 oz (96 kg)  08/28/12 211 lb 9.6 oz (95.981 kg)  08/07/12 220 lb 6.4 oz (99.973 kg)  07/31/12 218 lb 6.4 oz (99.066 kg)  06/19/12 217 lb 3.2 oz (98.521 kg)  05/04/12 207 lb 11.2 oz  (94.212 kg)  04/20/12 200 lb 8 oz (90.946 kg)  02/10/12 219 lb 14.4 oz (99.746 kg)  01/20/12 220 lb 9.6 oz (100.064 kg)  12/25/11 219 lb 12.8 oz (99.701 kg)    Usual Body Weight: 223 lb 2 weeks ago per pt report  % Usual Body Weight: 95  BMI:  Body mass index is 28.67 kg/(m^2).  Estimated Nutritional Needs: Kcal: 2200-2400 Protein: 100-120g Fluid: 2.2-2.4L/day  Skin: Intact   Diet Order: General  EDUCATION NEEDS: -Education needs addressed - discussed ways to make food taste more flavorful and strategies to help with dry mouth   Intake/Output Summary (Last 24 hours) at 09/17/12 1320 Last data filed at 09/17/12 0500  Gross per 24 hour  Intake      0 ml  Output    575 ml  Net   -575 ml    Last BM: 3/6 diarrhea  Labs:   Recent Labs Lab 09/16/12 1637 09/17/12 0355  NA 132* 130*  K 3.7 3.1*  CL 96 96  CO2 22 23  BUN 13 10  CREATININE 0.99 1.10  CALCIUM 8.5 7.7*  GLUCOSE 97 98    CBG (last 3)  No results found for this basename: GLUCAP,  in the last 72 hours  Scheduled Meds: . aspirin  81 mg Oral Daily  . aztreonam  2 g Intravenous Q8H  . filgrastim  480 mcg  Subcutaneous Q24H  . valACYclovir  500 mg Oral Daily  . vancomycin  1,000 mg Intravenous Q8H    Continuous Infusions: . sodium chloride 125 mL/hr at 09/17/12 0601    Past Medical History  Diagnosis Date  . Unspecified essential hypertension   . Coronary atherosclerosis of unspecified type of vessel, native or graft   . Pure hypercholesterolemia   . Overweight   . Benign neoplasm of colon   . Multiple myeloma, without mention of having achieved remission(203.00) 2004  . Urticaria, unspecified   . 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 Cancer 10/26/11 ct    multiple myelomatous lesion proximal right humerus,scaspula and ribs,  . History of chemotherapy 05/2003    high dose chemotherapy with autologus stem cell support/revlimid started 09/27/11  . Pneumonia 12/2007    hx  12/2007 &  agian 01/06/09  . Allergy   . URI (upper respiratory infection) 08/2009    viral URI  . Neuropathy     hx intermittement tingling/numbness in feet r/t? revlimid, , resolved  . Neuropathy 10/22/11    intermittent tingling feet  again with revlimid therapy  . H/O cardiac catheterization 07/13/02  . H/O colonoscopy 04/2007    6mm desc colon polyp =adenomatous  . 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 Limbal stem cell transplant  2004, 2013    stem cell transplant in 2004, 2013     Levon Hedger MS, RD, LDN [phone] Pager [phone] After Hours Pager

## 2012-09-17 NOTE — Progress Notes (Signed)
IP PROGRESS NOTE  Subjective:   He was admitted yesterday with a persistent high fever and malaise. He continues to have a fever, but reports feeling better this morning. He complains of anorexia.  Objective: Vital signs in last 24 hours: Blood pressure 133/64, pulse 98, temperature 103 F (39.4 C), temperature source Oral, resp. rate 18, height 6' 0.05" (1.83 m), weight 211 lb 10.3 oz (96 kg), SpO2 94.00%.  Intake/Output from previous day: 03/05 0701 - 03/06 0700 In: -  Out: 575 [Urine:575]  Physical Exam:  HEENT: No thrush Lungs: Inspiratory rhonchi at the right greater than left lower chest. No respiratory distress Cardiac: Regular rate and rhythm Abdomen: Nontender Extremities: No leg edema  Portacath/PICC-without erythema  Lab Results:  Recent Labs  09/16/12 1230 09/17/12 0355  WBC 1.4* 2.2*  HGB 12.7* 11.3*  HCT 36.3* 31.4*  PLT 92* 70*    BMET  Recent Labs  09/16/12 1637 09/17/12 0355  NA 132* 130*  K 3.7 3.1*  CL 96 96  CO2 22 23  GLUCOSE 97 98  BUN 13 10  CREATININE 0.99 1.10  CALCIUM 8.5 7.7*    Studies/Results: Ct Chest W Contrast  09/16/2012  *RADIOLOGY REPORT*  Clinical Data: Cough, fever, neutropenia  CT CHEST WITH CONTRAST  Technique:  Multidetector CT imaging of the chest was performed following the standard protocol during bolus administration of intravenous contrast.  Contrast: 80mL OMNIPAQUE IOHEXOL 300 MG/ML  SOLN  Comparison: None.  Findings: There is air space disease with air bronchograms in the posterior right lower lobe suggesting  lobar pneumonia.  There are scattered foci of ground-glass opacity within the right upper lobe which also could represent pulmonary infection.  Similar findings in the right middle lobe.  There are scattered ground-glass opacities in the left lung to a lesser degree.  No axillary supraclavicular lymphadenopathy.  No mediastinal or hilar lymphadenopathy.  No pericardial fluid.  Esophagus is normal.  Limited  view of the upper abdomen shows normal adrenal glands.  No focal hepatic lesion.  Review of the skeleton is unremarkable.  There are multiple lucent lesions within the spine which suggesting myeloma.  IMPRESSION:  1.  Right lower lobe lobar pneumonia. 2.  Scattered ground-glass opacities within the left and right lung also suggest pulmonary infection. 3.  No evidence of mediastinal lymphadenopathy. 4.  Multiple lucent lesions within the spine consistent with myeloma.   Original Report Authenticated By: Genevive Bi, M.D.     Medications: I have reviewed the patient's current medications.  Assessment/Plan:  1. Multiple myeloma-status post melphalan conditioning followed by autologous stem cell therapy in September of 2013, now maintained on maintenance Revlimid 2. Pneumonia-admitted on 09/16/2012 and started on vancomycin/aztreonam 3. Neutropenia/thrombocytopenia-most likely related to Revlimid and the acute infection 4. Loose stools on hospital admission-C. difficile toxin assay pending 5. Hypokalemia secondary to diarrhea-we will add potassium to the IV fluids  He is admitted with a persistent fever and malaise. The chest CT is consistent with pneumonia. The plan is to continue broad-spectrum intravenous antibiotics. We will followup on the urine/blood cultures and check a sputum culture. I will ask the infectious disease service to see him if the fever is not improved over the next 24 hours.   LOS: 1 day   [name], Jillyn Hidden  09/17/2012, 9:47 AM

## 2012-09-17 NOTE — Care Management (Signed)
CARE MANAGEMENT NOTE 09/17/2012  Patient:  SMITH, POTENZA   Account Number:  [id number]  Date Initiated:  09/17/2012  Documentation initiated by:  DAVIS,TYMEEKA  Subjective/Objective Assessment:   57 yo male admitted with neutropenia & PNA.     Action/Plan:   Home when stable   Anticipated DC Date:     Anticipated DC Plan:        DC Planning Services  CM consult      Choice offered to / List presented to:             Status of service:  In process, will continue to follow Medicare Important Message given?   (If response is "NO", the following Medicare IM given date fields will be blank) Date Medicare IM given:   Date Additional Medicare IM given:    Discharge Disposition:    Per UR Regulation:  Reviewed for med. necessity/level of care/duration of stay  If discussed at Long Length of Stay Meetings, dates discussed:    Comments:  09/17/12 1153 Tymeeka Davis,RN,BSN [phone]

## 2012-09-18 ENCOUNTER — Ambulatory Visit: Payer: BC Managed Care – PPO | Admitting: Oncology

## 2012-09-18 ENCOUNTER — Inpatient Hospital Stay (HOSPITAL_COMMUNITY): Payer: BC Managed Care – PPO

## 2012-09-18 ENCOUNTER — Ambulatory Visit: Payer: BC Managed Care – PPO

## 2012-09-18 ENCOUNTER — Other Ambulatory Visit (HOSPITAL_COMMUNITY): Payer: BC Managed Care – PPO

## 2012-09-18 LAB — CBC WITH DIFFERENTIAL/PLATELET
Basophils Absolute: 0 10*3/uL (ref 0.0–0.1)
Basophils Relative: 0 % (ref 0–1)
HCT: 28.9 % — ABNORMAL LOW (ref 39.0–52.0)
Hemoglobin: 10.5 g/dL — ABNORMAL LOW (ref 13.0–17.0)
Lymphocytes Relative: 8 % — ABNORMAL LOW (ref 12–46)
Monocytes Relative: 2 % — ABNORMAL LOW (ref 3–12)
Neutro Abs: 3.5 10*3/uL (ref 1.7–7.7)
WBC Morphology: INCREASED
WBC: 4 10*3/uL (ref 4.0–10.5)

## 2012-09-18 LAB — URINE CULTURE: Colony Count: NO GROWTH

## 2012-09-18 LAB — MAGNESIUM: Magnesium: 1.7 mg/dL (ref 1.5–2.5)

## 2012-09-18 LAB — BASIC METABOLIC PANEL
CO2: 22 mEq/L (ref 19–32)
GFR calc non Af Amer: 81 mL/min — ABNORMAL LOW (ref >90)
Glucose, Bld: 91 mg/dL (ref 70–99)
Potassium: 3 mEq/L — ABNORMAL LOW (ref 3.5–5.1)
Sodium: 132 mEq/L — ABNORMAL LOW (ref 135–145)

## 2012-09-18 LAB — EXPECTORATED SPUTUM ASSESSMENT W GRAM STAIN, RFLX TO RESP C

## 2012-09-18 MED ORDER — VANCOMYCIN HCL 10 G IV SOLR
1250.0000 mg | Freq: Three times a day (TID) | INTRAVENOUS | Status: DC
  Administered 2012-09-18 – 2012-09-19 (×4): 1250 mg via INTRAVENOUS
  Filled 2012-09-18 (×5): qty 1250

## 2012-09-18 MED ORDER — POTASSIUM CHLORIDE CRYS ER 20 MEQ PO TBCR
20.0000 meq | EXTENDED_RELEASE_TABLET | Freq: Two times a day (BID) | ORAL | Status: AC
  Administered 2012-09-18 – 2012-09-19 (×3): 20 meq via ORAL
  Filled 2012-09-18 (×3): qty 1

## 2012-09-18 NOTE — Progress Notes (Signed)
IP PROGRESS NOTE  Subjective:   He continues to have a fever, but reports feeling better. He is eating this morning.  Objective: Vital signs in last 24 hours: Blood pressure 139/65, pulse 101, temperature 101.9 F (38.8 C), temperature source Oral, resp. rate 18, height 6' 0.05" (1.83 m), weight 211 lb 10.3 oz (96 kg), SpO2 100.00%.  Intake/Output from previous day: 03/06 0701 - 03/07 0700 In: -  Out: 2350 [Urine:2350]  Physical Exam:   Lungs: Inspiratory rhonchi at the right greater than left lower chest. Scattered mild wheezing. No respiratory distress Cardiac: Regular rate and rhythm Extremities: No leg edema, faint erythema at the low leg bilaterally   Lab Results:  Recent Labs  09/17/12 1540 09/18/12 0345  WBC 2.6* 4.0  HGB 10.8* 10.5*  HCT 30.0* 28.9*  PLT 65* 64*   ANC 3.5  BMET  Recent Labs  09/17/12 0355 09/18/12 0345  NA 130* 132*  K 3.1* 3.0*  CL 96 100  CO2 23 22  GLUCOSE 98 91  BUN 10 7  CREATININE 1.10 1.00  CALCIUM 7.7* 7.3*    Studies/Results: Ct Chest W Contrast  09/16/2012  *RADIOLOGY REPORT*  Clinical Data: Cough, fever, neutropenia  CT CHEST WITH CONTRAST  Technique:  Multidetector CT imaging of the chest was performed following the standard protocol during bolus administration of intravenous contrast.  Contrast: 80mL OMNIPAQUE IOHEXOL 300 MG/ML  SOLN  Comparison: None.  Findings: There is air space disease with air bronchograms in the posterior right lower lobe suggesting  lobar pneumonia.  There are scattered foci of ground-glass opacity within the right upper lobe which also could represent pulmonary infection.  Similar findings in the right middle lobe.  There are scattered ground-glass opacities in the left lung to a lesser degree.  No axillary supraclavicular lymphadenopathy.  No mediastinal or hilar lymphadenopathy.  No pericardial fluid.  Esophagus is normal.  Limited view of the upper abdomen shows normal adrenal glands.  No focal  hepatic lesion.  Review of the skeleton is unremarkable.  There are multiple lucent lesions within the spine which suggesting myeloma.  IMPRESSION:  1.  Right lower lobe lobar pneumonia. 2.  Scattered ground-glass opacities within the left and right lung also suggest pulmonary infection. 3.  No evidence of mediastinal lymphadenopathy. 4.  Multiple lucent lesions within the spine consistent with myeloma.   Original Report Authenticated By: Genevive Bi, M.D.     Medications: I have reviewed the patient's current medications.  Assessment/Plan:  1. Multiple myeloma-status post melphalan conditioning followed by autologous stem cell therapy in September of 2013, now maintained on maintenance Revlimid 2. Pneumonia-admitted on 09/16/2012 and started on vancomycin/aztreonam, now day 3 3. Neutropenia/thrombocytopenia-most likely related to Revlimid and the acute infection, the neutropenia has resolved after 2 days of G-CSF 4. Loose stools on hospital admission-C. difficile toxin assay negative 5. Hypokalemia secondary to diarrhea-continue potassium in the IV fluids and we will add oral potassium today  His clinical status has improved and the fever is slightly better. Cultures of the blood and urine are negative to date. A sputum sample was submitted this morning. He will remain in the hospital on intravenous antibiotics. We will consult the infectious disease service if the high fever persists beyond today.  He is due for a metastatic bone survey. We will schedule this and a baseline chest x-ray for today.   LOS: 2 days   [name], [name]  09/18/2012, 12:09 PM

## 2012-09-18 NOTE — Progress Notes (Signed)
ANTIBIOTIC CONSULT NOTE - FOLLOW UP  Pharmacy Consult for vancomycin Indication: neutropenic fever   Allergies  Allergen Reactions  . Primaxin (Imipenem) Rash  . Lisinopril     REACTION: ALLERGIC to ACE w/ cough...  . Penicillins     rash  . Valsartan     REACTION: ALLERGIC to ARB w/ Urticaria \\T \ Angioedema    Patient Measurements: Height: 6' 0.05" (183 cm) (as documented 08/2012) Weight: 211 lb 10.3 oz (96 kg) (as documented 08/2012) IBW/kg (Calculated) : 77.71 Adjusted Body Weight:   Vital Signs: Temp: 99.6 F (37.6 C) (03/06 2133) Temp src: Oral (03/06 2133) BP: 162/76 mmHg (03/06 2133) Pulse Rate: 105 (03/06 2133) Intake/Output from previous day: 03/06 0701 - 03/07 0700 In: -  Out: 1500 [Urine:1500] Intake/Output from this shift: Total I/O In: -  Out: 300 [Urine:300]  Labs:  Recent Labs  09/16/12 1230 09/16/12 1637 09/17/12 0355 09/17/12 1540  WBC 1.4*  --  2.2* 2.6*  HGB 12.7*  --  11.3* 10.8*  PLT 92*  --  70* 65*  CREATININE  --  0.99 1.10  --    Estimated Creatinine Clearance: 88 ml/min (by C-G formula based on Cr of 1.1).  Recent Labs  09/17/12 2330  VANCOTROUGH 13.2     Microbiology: Recent Results (from the past 720 hour(s))  CULTURE, BLOOD (ROUTINE X 2)     Status: None   Collection Time    09/16/12  4:45 PM      Result Value Range Status   Specimen Description BLOOD LEFT HAND   Final   Special Requests BOTTLES DRAWN AEROBIC AND ANAEROBIC 10CC   Final   Culture  Setup Time 09/16/2012 21:52   Final   Culture     Final   Value:        BLOOD CULTURE RECEIVED NO GROWTH TO DATE CULTURE WILL BE HELD FOR 5 DAYS BEFORE ISSUING A FINAL NEGATIVE REPORT   Report Status PENDING   Incomplete  CULTURE, BLOOD (ROUTINE X 2)     Status: None   Collection Time    09/16/12  4:52 PM      Result Value Range Status   Specimen Description BLOOD LEFT ARM   Final   Special Requests BOTTLES DRAWN AEROBIC AND ANAEROBIC 10CC   Final   Culture  Setup Time  09/16/2012 21:52   Final   Culture     Final   Value:        BLOOD CULTURE RECEIVED NO GROWTH TO DATE CULTURE WILL BE HELD FOR 5 DAYS BEFORE ISSUING A FINAL NEGATIVE REPORT   Report Status PENDING   Incomplete  CLOSTRIDIUM DIFFICILE BY PCR     Status: None   Collection Time    09/17/12  8:25 AM      Result Value Range Status   C difficile by pcr NEGATIVE  NEGATIVE Final    Anti-infectives   Start     Dose/Rate Route Frequency Ordered Stop   09/18/12 0600  vancomycin (VANCOCIN) 1,250 mg in sodium chloride 0.9 % 250 mL IVPB     1,250 mg 166.7 mL/hr over 90 Minutes Intravenous Every 8 hours 09/18/12 0034     09/17/12 1000  valACYclovir (VALTREX) tablet 500 mg     500 mg Oral Daily 09/16/12 1636     09/16/12 2359  vancomycin (VANCOCIN) IVPB 1000 mg/200 mL premix  Status:  Discontinued     1,000 mg 200 mL/hr over 60 Minutes Intravenous Every 8 hours 09/16/12  1750 09/18/12 0034   09/16/12 2359  aztreonam (AZACTAM) 2 g in dextrose 5 % 50 mL IVPB     2 g 100 mL/hr over 30 Minutes Intravenous Every 8 hours 09/16/12 1750     09/16/12 1630  vancomycin (VANCOCIN) 1,500 mg in sodium chloride 0.9 % 500 mL IVPB     1,500 mg 250 mL/hr over 120 Minutes Intravenous  Once 09/16/12 1626 09/16/12 2005   09/16/12 1630  aztreonam (AZACTAM) 2 g in dextrose 5 % 50 mL IVPB     2 g 100 mL/hr over 30 Minutes Intravenous  Once 09/16/12 1626 09/16/12 1800      Assessment: Patient with low vancomycin level.    Goal of Therapy:  Vancomycin trough level 15-20 mcg/ml  Plan:  Measure antibiotic drug levels at steady state Follow up culture results Change to 1250mg  iv vancomycin q8hr, recheck level as needed  Aleene Davidson Crowford 09/18/2012,12:39 AM

## 2012-09-18 NOTE — Progress Notes (Signed)
Pt ambulated unit with wife. He had no dyspnea, SOB, weakness, or unsteadiness on his feet. Will continue to monitor.

## 2012-09-19 ENCOUNTER — Encounter (HOSPITAL_COMMUNITY): Payer: Self-pay | Admitting: Infectious Diseases

## 2012-09-19 DIAGNOSIS — R5081 Fever presenting with conditions classified elsewhere: Secondary | ICD-10-CM

## 2012-09-19 LAB — CBC WITH DIFFERENTIAL/PLATELET
Basophils Absolute: 0 10*3/uL (ref 0.0–0.1)
Basophils Relative: 0 % (ref 0–1)
Eosinophils Absolute: 0 10*3/uL (ref 0.0–0.7)
Eosinophils Relative: 0 % (ref 0–5)
HCT: 26.7 % — ABNORMAL LOW (ref 39.0–52.0)
MCH: 32.9 pg (ref 26.0–34.0)
MCHC: 36 g/dL (ref 30.0–36.0)
Monocytes Absolute: 0.1 10*3/uL (ref 0.1–1.0)
Monocytes Relative: 2 % — ABNORMAL LOW (ref 3–12)
Neutro Abs: 3.2 10*3/uL (ref 1.7–7.7)
RDW: 14.4 % (ref 11.5–15.5)

## 2012-09-19 LAB — VANCOMYCIN, TROUGH: Vancomycin Tr: 12.5 ug/mL (ref 10.0–20.0)

## 2012-09-19 LAB — AMYLASE: Amylase: 62 U/L (ref 0–105)

## 2012-09-19 LAB — CRYPTOCOCCAL ANTIGEN

## 2012-09-19 LAB — BASIC METABOLIC PANEL
BUN: 6 mg/dL (ref 6–23)
Calcium: 7.1 mg/dL — ABNORMAL LOW (ref 8.4–10.5)
Chloride: 103 mEq/L (ref 96–112)
Creatinine, Ser: 0.8 mg/dL (ref 0.50–1.35)
GFR calc Af Amer: >90 mL/min (ref >90)
GFR calc non Af Amer: >90 mL/min (ref >90)

## 2012-09-19 MED ORDER — ACETAMINOPHEN 325 MG PO TABS
650.0000 mg | ORAL_TABLET | Freq: Once | ORAL | Status: AC
  Administered 2012-09-19: 650 mg via ORAL

## 2012-09-19 MED ORDER — SODIUM CHLORIDE 0.9 % IV SOLN
100.0000 mg | Freq: Every day | INTRAVENOUS | Status: DC
  Administered 2012-09-19 – 2012-09-23 (×5): 100 mg via INTRAVENOUS
  Filled 2012-09-19 (×7): qty 100

## 2012-09-19 MED ORDER — VANCOMYCIN HCL 10 G IV SOLR
1500.0000 mg | Freq: Three times a day (TID) | INTRAVENOUS | Status: DC
  Administered 2012-09-19 – 2012-09-21 (×6): 1500 mg via INTRAVENOUS
  Filled 2012-09-19 (×10): qty 1500

## 2012-09-19 NOTE — Progress Notes (Signed)
Patient had fever of 102.1, MD Mohammed contacted and order for one time dose of acetaminophen 650 given.

## 2012-09-19 NOTE — Consult Note (Addendum)
INFECTIOUS DISEASE CONSULT NOTE  Date of Admission:  09/16/2012  Date of Consult:  09/19/2012  Reason for Consult: neutropenic fever Referring Physician: Truett Perna  Impression/Recommendation Neutropenic fever Pneumonia Loose BM (C diff -)  UCx - BCx ngtd Sputum Cx- upper resp flora  Would: Check sputum PCP Add mycafungin for fungal coverage.  Check BCx afb, fungus. Check H1N1/respiratory virus panel If fever persists or change in clinical status consider empiric PCP and atypical coverage Bronch is consideration if his status changes as well.   His family asks about investigating with echo. Will defer this until BCx +  Thank you so much for this interesting consult,   [name] [phone]  Chad [name] is an 57 y.o. male.  HPI: 57yo M with hx of multiple myeloma, s/p BM TXP 11-04 and then again 03-18-12 (auto). 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  He was seen for urgent h/o visit 2-28 with fever, sx of sinus infection, CXR -. He was treated with a z-pack. His fever persisted and he returned to the hospital 3-5 (completed z-pack 3-4) with temp 101.3 and non-productive cough. He was also noted to have loose stools. He was started on vanco/aztreonam. He also received neupogen as he had ANC 600 on admission.  He underwent CT scan (3-5) showing 1. Right lower lobe lobar pneumonia. 2. Scattered ground-glass opacities within the left and right lung also suggest pulmonary infection. 3. No evidence of mediastinal lymphadenopathy. 4. Multiple lucent lesions within the spine consistent with myeloma.  He has continued to have fever up to 103 in hospital. 102 today continues to have DOE as well.  He underwent bone scan and this was felt to show worsening pneumonia.  He does not have a port  Past Medical History  Diagnosis Date  . Unspecified essential hypertension   . Coronary atherosclerosis of unspecified type of vessel,  native or graft   . Pure hypercholesterolemia   . Overweight   . Benign neoplasm of colon   . Multiple myeloma, without mention of having achieved remission(203.00) 2004  . Urticaria, unspecified   . 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 Cancer 10/26/11 ct    multiple myelomatous lesion proximal right humerus,scaspula and ribs,  . History of chemotherapy 05/2003    high dose chemotherapy with autologus stem cell support/revlimid started 09/27/11  . Pneumonia 12/2007    hx  12/2007 & agian 01/06/09  . Allergy   . URI (upper respiratory infection) 08/2009    viral URI  . Neuropathy     hx intermittement tingling/numbness in feet r/t? revlimid, , resolved  . Neuropathy 10/22/11    intermittent tingling feet  again with revlimid therapy  . H/O cardiac catheterization 07/13/02  . H/O colonoscopy 04/2007    6mm desc colon polyp =adenomatous  . 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 Limbal stem cell transplant  2004, 2013    stem cell transplant in 2004, 2013     Allergies  Allergen Reactions  . Primaxin (Imipenem) Rash  . Cephalosporins   . Lisinopril     REACTION: ALLERGIC to ACE w/ cough...  . Moxifloxacin     Swollen lips  . Penicillins     rash  . Valsartan     REACTION: ALLERGIC to ARB w/ Urticaria \\T \ Angioedema    Medications:  Scheduled: .  aztreonam  2 g Intravenous Q8H  . feeding supplement  1 Container Oral TID BM  . valACYclovir  500 mg Oral Daily  . vancomycin  1,500 mg Intravenous Q8H    Total days of antibiotics 4 (vanco/imipenem)         Social History:  reports that he has never smoked. He has never used smokeless tobacco. He reports that  drinks alcohol. He reports that he does not use illicit drugs.  History reviewed. No pertinent family history. Father recently decesased with lymphoma.  Wife with CXR (-), PPD+. Also now with URI Mother in SNF with persistent cough over last  several months.   General ROS: normal urination, his BM have become solid, no sinus pain, no abd pain, denies sick contacts (see wife above), see HPI Denies rash. Denies insect exposures, no pets in the home.   Blood pressure 114/65, pulse 89, temperature 99.8 F (37.7 C), temperature source Oral, resp. rate 18, height 6' 0.05" (1.83 m), weight 96 kg (211 lb 10.3 oz), SpO2 94.00%. General appearance: alert, cooperative and no distress Eyes: negative findings: pupils equal, round, reactive to light and accomodation Throat: normal findings: oropharynx pink & moist without lesions or evidence of thrush Neck: no adenopathy, supple, symmetrical, trachea midline and FROM Lungs: clear to auscultation bilaterally Heart: regular rate and rhythm Abdomen: normal findings: bowel sounds normal and soft, non-tender and abnormal findings:  distended Extremities: edema none and faint petechial rash on LE Lymph nodes: Cervical, supraclavicular, and axillary nodes normal.   Results for orders placed during the hospital encounter of 09/16/12 (from the past 48 hour(s))  CBC WITH DIFFERENTIAL     Status: Abnormal   Collection Time    09/17/12  3:40 PM      Result Value Range   WBC 2.6 (*) 4.0 - 10.5 K/uL   RBC 3.26 (*) 4.22 - 5.81 MIL/uL   Hemoglobin 10.8 (*) 13.0 - 17.0 g/dL   HCT 16.1 (*) 09.6 - 04.5 %   MCV 92.0  78.0 - 100.0 fL   MCH 33.1  26.0 - 34.0 pg   MCHC 36.0  30.0 - 36.0 g/dL   RDW 40.9  81.1 - 91.4 %   Platelets 65 (*) 150 - 400 K/uL   Neutrophils Relative 86 (*) 43 - 77 %   Lymphocytes Relative 9 (*) 12 - 46 %   Monocytes Relative 3  3 - 12 %   Eosinophils Relative 2  0 - 5 %   Basophils Relative 0  0 - 1 %   Neutro Abs 2.2  1.7 - 7.7 K/uL   Lymphs Abs 0.2 (*) 0.7 - 4.0 K/uL   Monocytes Absolute 0.1  0.1 - 1.0 K/uL   Eosinophils Absolute 0.1  0.0 - 0.7 K/uL   Basophils Absolute 0.0  0.0 - 0.1 K/uL   WBC Morphology MILD LEFT SHIFT (1-5% METAS, OCC MYELO, OCC BANDS)    VANCOMYCIN,  TROUGH     Status: None   Collection Time    09/17/12 11:30 PM      Result Value Range   Vancomycin Tr 13.2  10.0 - 20.0 ug/mL  CBC WITH DIFFERENTIAL     Status: Abnormal   Collection Time    09/18/12  3:45 AM      Result Value Range   WBC 4.0  4.0 - 10.5 K/uL   RBC 3.18 (*) 4.22 - 5.81 MIL/uL   Hemoglobin 10.5 (*) 13.0 - 17.0 g/dL   HCT 78.2 (*)  39.0 - 52.0 %   MCV 90.9  78.0 - 100.0 fL   MCH 33.0  26.0 - 34.0 pg   MCHC 36.3 (*) 30.0 - 36.0 g/dL   RDW 16.1  09.6 - 04.5 %   Platelets 64 (*) 150 - 400 K/uL   Comment: SPECIMEN CHECKED FOR CLOTS     REPEATED TO VERIFY     CONSISTENT WITH PREVIOUS RESULT   Neutrophils Relative 88 (*) 43 - 77 %   Lymphocytes Relative 8 (*) 12 - 46 %   Monocytes Relative 2 (*) 3 - 12 %   Eosinophils Relative 2  0 - 5 %   Basophils Relative 0  0 - 1 %   Neutro Abs 3.5  1.7 - 7.7 K/uL   Lymphs Abs 0.3 (*) 0.7 - 4.0 K/uL   Monocytes Absolute 0.1  0.1 - 1.0 K/uL   Eosinophils Absolute 0.1  0.0 - 0.7 K/uL   Basophils Absolute 0.0  0.0 - 0.1 K/uL   RBC Morphology TEARDROP CELLS     WBC Morphology INCREASED BANDS (>20% BANDS)     Comment: DOHLE BODIES  BASIC METABOLIC PANEL     Status: Abnormal   Collection Time    09/18/12  3:45 AM      Result Value Range   Sodium 132 (*) 135 - 145 mEq/L   Potassium 3.0 (*) 3.5 - 5.1 mEq/L   Chloride 100  96 - 112 mEq/L   CO2 22  19 - 32 mEq/L   Glucose, Bld 91  70 - 99 mg/dL   BUN 7  6 - 23 mg/dL   Creatinine, Ser 4.09  0.50 - 1.35 mg/dL   Calcium 7.3 (*) 8.4 - 10.5 mg/dL   GFR calc non Af Amer 81 (*) >90 mL/min   GFR calc Af Amer >90  >90 mL/min   Comment:            The eGFR has been calculated     using the CKD EPI equation.     This calculation has not been     validated in all clinical     situations.     eGFR's persistently     <90 mL/min signify     possible Chronic Kidney Disease.  MAGNESIUM     Status: None   Collection Time    09/18/12  3:45 AM      Result Value Range   Magnesium 1.7  1.5 -  2.5 mg/dL  CULTURE, EXPECTORATED SPUTUM-ASSESSMENT     Status: None   Collection Time    09/18/12  8:45 AM      Result Value Range   Specimen Description SPUTUM     Special Requests NONE     Sputum evaluation       Value: THIS SPECIMEN IS ACCEPTABLE. RESPIRATORY CULTURE REPORT TO FOLLOW.   Report Status 09/18/2012 FINAL    CULTURE, RESPIRATORY (NON-EXPECTORATED)     Status: None   Collection Time    09/18/12  8:45 AM      Result Value Range   Specimen Description SPUTUM     Special Requests NONE     Gram Stain       Value: NO WBC SEEN     RARE SQUAMOUS EPITHELIAL CELLS PRESENT     RARE GRAM NEGATIVE COCCI     RARE GRAM POSITIVE RODS     RARE GRAM POSITIVE COCCI IN PAIRS   Culture NORMAL OROPHARYNGEAL FLORA     Report  Status PENDING    CBC WITH DIFFERENTIAL     Status: Abnormal   Collection Time    09/19/12  5:05 AM      Result Value Range   WBC 3.6 (*) 4.0 - 10.5 K/uL   RBC 2.92 (*) 4.22 - 5.81 MIL/uL   Hemoglobin 9.6 (*) 13.0 - 17.0 g/dL   HCT 47.4 (*) 25.9 - 56.3 %   MCV 91.4  78.0 - 100.0 fL   MCH 32.9  26.0 - 34.0 pg   MCHC 36.0  30.0 - 36.0 g/dL   RDW 87.5  64.3 - 32.9 %   Platelets 65 (*) 150 - 400 K/uL   Comment: CONSISTENT WITH PREVIOUS RESULT   Neutrophils Relative 89 (*) 43 - 77 %   Neutro Abs 3.2  1.7 - 7.7 K/uL   Lymphocytes Relative 9 (*) 12 - 46 %   Lymphs Abs 0.3 (*) 0.7 - 4.0 K/uL   Monocytes Relative 2 (*) 3 - 12 %   Monocytes Absolute 0.1  0.1 - 1.0 K/uL   Eosinophils Relative 0  0 - 5 %   Eosinophils Absolute 0.0  0.0 - 0.7 K/uL   Basophils Relative 0  0 - 1 %   Basophils Absolute 0.0  0.0 - 0.1 K/uL  BASIC METABOLIC PANEL     Status: Abnormal   Collection Time    09/19/12  5:05 AM      Result Value Range   Sodium 135  135 - 145 mEq/L   Potassium 2.9 (*) 3.5 - 5.1 mEq/L   Chloride 103  96 - 112 mEq/L   CO2 21  19 - 32 mEq/L   Glucose, Bld 102 (*) 70 - 99 mg/dL   BUN 6  6 - 23 mg/dL   Creatinine, Ser 5.18  0.50 - 1.35 mg/dL   Calcium 7.1  (*) 8.4 - 10.5 mg/dL   GFR calc non Af Amer >90  >90 mL/min   GFR calc Af Amer >90  >90 mL/min   Comment:            The eGFR has been calculated     using the CKD EPI equation.     This calculation has not been     validated in all clinical     situations.     eGFR's persistently     <90 mL/min signify     possible Chronic Kidney Disease.  VANCOMYCIN, TROUGH     Status: None   Collection Time    09/19/12  5:05 AM      Result Value Range   Vancomycin Tr 12.5  10.0 - 20.0 ug/mL      Component Value Date/Time   SDES SPUTUM 09/18/2012 0845   SDES SPUTUM 09/18/2012 0845   SPECREQUEST NONE 09/18/2012 0845   SPECREQUEST NONE 09/18/2012 0845   CULT NORMAL OROPHARYNGEAL FLORA 09/18/2012 0845   REPTSTATUS 09/18/2012 FINAL 09/18/2012 0845   REPTSTATUS PENDING 09/18/2012 0845   Dg Bone Survey Met  09/18/2012  *RADIOLOGY REPORT*  Clinical Data: History multiple myeloma.  METASTATIC BONE SURVEY  Comparison: Osseous survey 10/19/2010 and 09/18/2011. CT chest 09/16/2012 and PA and lateral chest 09/11/2012.  Findings: Single view of the chest demonstrates right lower lobe airspace disease.  There is new airspace disease in the lingula. Heart size is normal.  No pneumothorax or pleural fluid is seen.  Tiny lucent lesions in the calvarium seen on most recent osseous survey are difficult to appreciate on today's examination.  There may be a tiny lesion in the parietal bone.  Lytic lesions in the thoracic and lumbar spine seen on the prior study do not appear changed. Small lesions in the pubic bones and femoral necks bilaterally are again seen and not markedly changed in appearance. Lesions in the left humerus and bilateral ribs are again identified.  Single lytic lesion in the proximal right humerus is noted.  No new lytic lesion is seen.  Since the prior osseous survey, the patient has developed a fracture of the right seventh rib.  The fracture appears subacute to remote with callus formation identified.  IMPRESSION:   1. Progression of airspace disease in the chest since the prior CT scan with opacity now identified in the lingula compatible with worsened pneumonia. 2.  Extensive myelomatous disease in the axial or appendicular skeleton does not appear markedly changed with exception of lesions in the calvarium which are not as well visualized on today's study. 3. Right seventh rib fracture appears subacute to remote but is new since the prior osseous survey.  These results will be called to the ordering clinician or representative by the Radiologist Assistant, and communication documented in the PACS Dashboard.   Original Report Authenticated By: Holley Dexter, M.D.    Recent Results (from the past 240 hour(s))  CULTURE, BLOOD (ROUTINE X 2)     Status: None   Collection Time    09/16/12  4:45 PM      Result Value Range Status   Specimen Description BLOOD LEFT HAND   Final   Special Requests BOTTLES DRAWN AEROBIC AND ANAEROBIC 10CC   Final   Culture  Setup Time 09/16/2012 21:52   Final   Culture     Final   Value:        BLOOD CULTURE RECEIVED NO GROWTH TO DATE CULTURE WILL BE HELD FOR 5 DAYS BEFORE ISSUING A FINAL NEGATIVE REPORT   Report Status PENDING   Incomplete  CULTURE, BLOOD (ROUTINE X 2)     Status: None   Collection Time    09/16/12  4:52 PM      Result Value Range Status   Specimen Description BLOOD LEFT ARM   Final   Special Requests BOTTLES DRAWN AEROBIC AND ANAEROBIC 10CC   Final   Culture  Setup Time 09/16/2012 21:52   Final   Culture     Final   Value:        BLOOD CULTURE RECEIVED NO GROWTH TO DATE CULTURE WILL BE HELD FOR 5 DAYS BEFORE ISSUING A FINAL NEGATIVE REPORT   Report Status PENDING   Incomplete  URINE CULTURE     Status: None   Collection Time    09/16/12  6:09 PM      Result Value Range Status   Specimen Description URINE, CLEAN CATCH   Final   Special Requests NONE   Final   Culture  Setup Time 09/17/2012 02:42   Final   Colony Count NO GROWTH   Final   Culture NO  GROWTH   Final   Report Status 09/18/2012 FINAL   Final  CLOSTRIDIUM DIFFICILE BY PCR     Status: None   Collection Time    09/17/12  8:25 AM      Result Value Range Status   C difficile by pcr NEGATIVE  NEGATIVE Final  CULTURE, EXPECTORATED SPUTUM-ASSESSMENT     Status: None   Collection Time    09/18/12  8:45 AM      Result  Value Range Status   Specimen Description SPUTUM   Final   Special Requests NONE   Final   Sputum evaluation     Final   Value: THIS SPECIMEN IS ACCEPTABLE. RESPIRATORY CULTURE REPORT TO FOLLOW.   Report Status 09/18/2012 FINAL   Final  CULTURE, RESPIRATORY (NON-EXPECTORATED)     Status: None   Collection Time    09/18/12  8:45 AM      Result Value Range Status   Specimen Description SPUTUM   Final   Special Requests NONE   Final   Gram Stain     Final   Value: NO WBC SEEN     RARE SQUAMOUS EPITHELIAL CELLS PRESENT     RARE GRAM NEGATIVE COCCI     RARE GRAM POSITIVE RODS     RARE GRAM POSITIVE COCCI IN PAIRS   Culture NORMAL OROPHARYNGEAL FLORA   Final   Report Status PENDING   Incomplete      09/19/2012, 2:52 PM     LOS: 3 days

## 2012-09-19 NOTE — Progress Notes (Signed)
Call placed to [facility].  MD informed of patient persisting temperatures and states to wait one hour and give a dose of PRN tylenol 650mg  .

## 2012-09-19 NOTE — Progress Notes (Addendum)
ANTIBIOTIC CONSULT NOTE - FOLLOW UP  Pharmacy Consult for Vancomycin, Aztreonam Indication: neutropenic fever  Allergies  Allergen Reactions  . Primaxin (Imipenem) Rash  . Cephalosporins   . Lisinopril     REACTION: ALLERGIC to ACE w/ cough...  . Moxifloxacin     Swollen lips  . Penicillins     rash  . Valsartan     REACTION: ALLERGIC to ARB w/ Urticaria \\T \ Angioedema    Patient Measurements: Height: 6' 0.05" (183 cm) (as documented 08/2012) Weight: 211 lb 10.3 oz (96 kg) (as documented 08/2012) IBW/kg (Calculated) : 77.71  Vital Signs: Temp: 99.8 F (37.7 C) (03/08 0635) Temp src: Oral (03/08 0635) BP: 114/65 mmHg (03/08 0635) Pulse Rate: 89 (03/08 0635)  Labs:  Recent Labs  09/17/12 0355 09/17/12 1540 09/18/12 0345 09/19/12 0505  WBC 2.2* 2.6* 4.0 3.6*  HGB 11.3* 10.8* 10.5* 9.6*  PLT 70* 65* 64* 65*  CREATININE 1.10  --  1.00 0.80   Estimated Creatinine Clearance: 121 ml/min (by C-G formula based on Cr of 0.8).  Recent Labs  09/17/12 2330 09/19/12 0505  VANCOTROUGH 13.2 12.5     Microbiology: Recent Results (from the past 720 hour(s))  CULTURE, BLOOD (ROUTINE X 2)     Status: None   Collection Time    09/16/12  4:45 PM      Result Value Range Status   Specimen Description BLOOD LEFT HAND   Final   Special Requests BOTTLES DRAWN AEROBIC AND ANAEROBIC 10CC   Final   Culture  Setup Time 09/16/2012 21:52   Final   Culture     Final   Value:        BLOOD CULTURE RECEIVED NO GROWTH TO DATE CULTURE WILL BE HELD FOR 5 DAYS BEFORE ISSUING A FINAL NEGATIVE REPORT   Report Status PENDING   Incomplete  CULTURE, BLOOD (ROUTINE X 2)     Status: None   Collection Time    09/16/12  4:52 PM      Result Value Range Status   Specimen Description BLOOD LEFT ARM   Final   Special Requests BOTTLES DRAWN AEROBIC AND ANAEROBIC 10CC   Final   Culture  Setup Time 09/16/2012 21:52   Final   Culture     Final   Value:        BLOOD CULTURE RECEIVED NO GROWTH TO DATE  CULTURE WILL BE HELD FOR 5 DAYS BEFORE ISSUING A FINAL NEGATIVE REPORT   Report Status PENDING   Incomplete  URINE CULTURE     Status: None   Collection Time    09/16/12  6:09 PM      Result Value Range Status   Specimen Description URINE, CLEAN CATCH   Final   Special Requests NONE   Final   Culture  Setup Time 09/17/2012 02:42   Final   Colony Count NO GROWTH   Final   Culture NO GROWTH   Final   Report Status 09/18/2012 FINAL   Final  CLOSTRIDIUM DIFFICILE BY PCR     Status: None   Collection Time    09/17/12  8:25 AM      Result Value Range Status   C difficile by pcr NEGATIVE  NEGATIVE Final  CULTURE, EXPECTORATED SPUTUM-ASSESSMENT     Status: None   Collection Time    09/18/12  8:45 AM      Result Value Range Status   Specimen Description SPUTUM   Final   Special Requests NONE  Final   Sputum evaluation     Final   Value: THIS SPECIMEN IS ACCEPTABLE. RESPIRATORY CULTURE REPORT TO FOLLOW.   Report Status 09/18/2012 FINAL   Final   Anti-infectives: 3/6 >> Valacyclovir >> 3/5 >> Vancomycin >> 3/5 >> Aztreonam >>  Assessment:  83 yom with h/o multiple myeloma s/p autologous stem cell support in 2013 currently on maintenance Revlimid. Pt presented to the cancer center 2/28 with persistent sinus symptoms, fever, cough and was prescribed Z-pak (unremarkable CXR). Pt completed Z-prak 3/4 but fever and non-productive cough persisted. Pt admitted to hospital 3/5 and started on broad spectrum IV abx.  Day #4 Vancomycin and Aztreonam.  Cultures remain negative  Current Temp is 99.8, 24h Tm 102, WBC 3.6  Renal function is stable/improved, with CrCl > 100 ml/min (CG and normalized)  Vancomycin trough level = 12.5 after 3 doses.  This is slightly below goal, and decreased from previous VT = 13.2  Goal of Therapy:  Vancomycin trough level 15-20 mcg/ml  Plan:   Continue Aztreonam 2g IV q8h  Increase to Vancomycin 1500mg  IV q8h.  Re-check Vanc trough as needed  Follow up  renal fxn and culture results.   Lynann Beaver PharmD, BCPS Pager [phone] 09/19/2012 7:30 AM

## 2012-09-19 NOTE — Progress Notes (Signed)
Subjective: He is feeling well this AM with no complaints. One episode of fever last night but afebrile this morning. No nausea or vomiting.  Objective: Vital signs in last 24 hours: Temp:  [98.3 F (36.8 C)-102 F (38.9 C)] 99.8 F (37.7 C) (03/08 0635) Pulse Rate:  [89-112] 89 (03/08 0635) Resp:  [17-20] 18 (03/08 0635) BP: (114-165)/(61-78) 114/65 mmHg (03/08 0635) SpO2:  [92 %-95 %] 94 % (03/08 0635)  Intake/Output from previous day: 03/07 0701 - 03/08 0700 In: 720 [P.O.:720] Out: 4050 [Urine:4050] Intake/Output this shift:    General appearance: alert, cooperative and no distress Resp: clear to auscultation bilaterally Cardio: regular rate and rhythm, S1, S2 normal, no murmur, click, rub or gallop GI: soft, non-tender; bowel sounds normal; no masses,  no organomegaly Extremities: extremities normal, atraumatic, no cyanosis or edema  Lab Results:   Recent Labs  09/18/12 0345 09/19/12 0505  WBC 4.0 3.6*  HGB 10.5* 9.6*  HCT 28.9* 26.7*  PLT 64* 65*   BMET  Recent Labs  09/18/12 0345 09/19/12 0505  NA 132* 135  K 3.0* 2.9*  CL 100 103  CO2 22 21  GLUCOSE 91 102*  BUN 7 6  CREATININE 1.00 0.80  CALCIUM 7.3* 7.1*    Studies/Results: Dg Bone Survey Met  09/18/2012  *RADIOLOGY REPORT*  Clinical Data: History multiple myeloma.  METASTATIC BONE SURVEY  Comparison: Osseous survey 10/19/2010 and 09/18/2011. CT chest 09/16/2012 and PA and lateral chest 09/11/2012.  Findings: Single view of the chest demonstrates right lower lobe airspace disease.  There is new airspace disease in the lingula. Heart size is normal.  No pneumothorax or pleural fluid is seen.  Tiny lucent lesions in the calvarium seen on most recent osseous survey are difficult to appreciate on today's examination.  There may be a tiny lesion in the parietal bone.  Lytic lesions in the thoracic and lumbar spine seen on the prior study do not appear changed. Small lesions in the pubic bones and femoral  necks bilaterally are again seen and not markedly changed in appearance. Lesions in the left humerus and bilateral ribs are again identified.  Single lytic lesion in the proximal right humerus is noted.  No new lytic lesion is seen.  Since the prior osseous survey, the patient has developed a fracture of the right seventh rib.  The fracture appears subacute to remote with callus formation identified.  IMPRESSION:  1. Progression of airspace disease in the chest since the prior CT scan with opacity now identified in the lingula compatible with worsened pneumonia. 2.  Extensive myelomatous disease in the axial or appendicular skeleton does not appear markedly changed with exception of lesions in the calvarium which are not as well visualized on today's study. 3. Right seventh rib fracture appears subacute to remote but is new since the prior osseous survey.  These results will be called to the ordering clinician or representative by the Radiologist Assistant, and communication documented in the PACS Dashboard.   Original Report Authenticated By: Holley Dexter, M.D.     Medications: I have reviewed the patient's current medications.  Assessment/Plan: No change in the current assessmenyt and plan. 1. Multiple myeloma-status post melphalan conditioning followed by autologous stem cell therapy in September of 2013, now maintained on maintenance Revlimid  2. Pneumonia-admitted on 09/16/2012 and started on vancomycin/aztreonam, now day 4  3. Neutropenia/thrombocytopenia-most likely related to Revlimid and the acute infection, the neutropenia has resolved after 2 days of G-CSF  4. Loose stools on  hospital admission-C. difficile toxin assay negative  5. Hypokalemia secondary to diarrhea-continue potassium in the IV fluids and we will add oral potassium today   LOS: 3 days    MOHAMED,MOHAMED K. 09/19/2012

## 2012-09-20 ENCOUNTER — Inpatient Hospital Stay (HOSPITAL_COMMUNITY): Payer: BC Managed Care – PPO

## 2012-09-20 LAB — CBC
HCT: 27.5 % — ABNORMAL LOW (ref 39.0–52.0)
Hemoglobin: 10 g/dL — ABNORMAL LOW (ref 13.0–17.0)
WBC: 1.3 10*3/uL — CL (ref 4.0–10.5)

## 2012-09-20 LAB — CULTURE, RESPIRATORY W GRAM STAIN: Gram Stain: NONE SEEN

## 2012-09-20 LAB — BASIC METABOLIC PANEL
BUN: 6 mg/dL (ref 6–23)
Chloride: 100 mEq/L (ref 96–112)
GFR calc Af Amer: >90 mL/min (ref >90)
Potassium: 3.1 mEq/L — ABNORMAL LOW (ref 3.5–5.1)

## 2012-09-20 MED ORDER — IOHEXOL 300 MG/ML  SOLN
100.0000 mL | Freq: Once | INTRAMUSCULAR | Status: AC | PRN
  Administered 2012-09-20: 100 mL via INTRAVENOUS

## 2012-09-20 MED ORDER — CLINDAMYCIN PHOSPHATE 600 MG/50ML IV SOLN
600.0000 mg | Freq: Three times a day (TID) | INTRAVENOUS | Status: DC
  Administered 2012-09-20 – 2012-09-23 (×9): 600 mg via INTRAVENOUS
  Filled 2012-09-20 (×11): qty 50

## 2012-09-20 NOTE — Progress Notes (Signed)
Subjective: Continues to be febrile almost on a daily basis. Objective: Vital signs in last 24 hours: Temp:  [98.2 F (36.8 C)-103 F (39.4 C)] 98.2 F (36.8 C) (03/09 0935) Pulse Rate:  [89-111] 89 (03/09 0512) Resp:  [16-18] 18 (03/09 0512) BP: (144-164)/(74-77) 144/74 mmHg (03/09 0512) SpO2:  [95 %] 95 % (03/09 0512)  Intake/Output from previous day: 03/08 0701 - 03/09 0700 In: 1900 [P.O.:1900] Out: 3550 [Urine:3550] Intake/Output this shift: Total I/O In: 360 [P.O.:360] Out: 400 [Urine:400]  General appearance: alert, cooperative and no distress Resp: clear to auscultation bilaterally Cardio: regular rate and rhythm, S1, S2 normal, no murmur, click, rub or gallop GI: soft, non-tender; bowel sounds normal; no masses,  no organomegaly Extremities: extremities normal, atraumatic, no cyanosis or edema  Lab Results:   Recent Labs  09/18/12 0345 09/19/12 0505  WBC 4.0 3.6*  HGB 10.5* 9.6*  HCT 28.9* 26.7*  PLT 64* 65*   BMET  Recent Labs  09/18/12 0345 09/19/12 0505  NA 132* 135  K 3.0* 2.9*  CL 100 103  CO2 22 21  GLUCOSE 91 102*  BUN 7 6  CREATININE 1.00 0.80  CALCIUM 7.3* 7.1*    Studies/Results: METASTATIC BONE SURVEY  Comparison: Osseous survey 10/19/2010 and 09/18/2011. CT chest  09/16/2012 and PA and lateral chest 09/11/2012.  Findings: Single view of the chest demonstrates right lower lobe  airspace disease. There is new airspace disease in the lingula.  Heart size is normal. No pneumothorax or pleural fluid is seen.  Tiny lucent lesions in the calvarium seen on most recent osseous  survey are difficult to appreciate on today's examination. There  may be a tiny lesion in the parietal bone. Lytic lesions in the  thoracic and lumbar spine seen on the prior study do not appear  changed. Small lesions in the pubic bones and femoral necks  bilaterally are again seen and not markedly changed in appearance.  Lesions in the left humerus and bilateral  ribs are again  identified. Single lytic lesion in the proximal right humerus is  noted. No new lytic lesion is seen. Since the prior osseous  survey, the patient has developed a fracture of the right seventh  rib. The fracture appears subacute to remote with callus formation  identified.  IMPRESSION:  1. Progression of airspace disease in the chest since the prior CT  scan with opacity now identified in the lingula compatible with  worsened pneumonia.  2. Extensive myelomatous disease in the axial or appendicular  skeleton does not appear markedly changed with exception of lesions  in the calvarium which are not as well visualized on today's study.  3. Right seventh rib fracture appears subacute to remote but is new  since the prior osseous survey.  These results will be called to the ordering clinician or  representative by the Radiologist Assistant, and communication  documented in the PACS Dashboard.  Medications: I have reviewed the patient's current medications.  Assessment/Plan:  1. Multiple myeloma-status post melphalan conditioning followed by autologous stem cell therapy in September of 2013, now maintained on maintenance Revlimid  2. Pneumonia-admitted on 09/16/2012 and started on vancomycin/aztreonam, now day 5  3. Neutropenia/thrombocytopenia-most likely related to Revlimid and the acute infection, the neutropenia has resolved after 2 days of G-CSF  4. Loose stools on hospital admission-C. difficile toxin assay negative  5. Hypokalemia secondary to diarrhea-continue potassium in the IV fluids and we will add oral potassium today 6. We will check his cbc and  electrolytes daily. 7. Seen by ID and they are recommending possible bronchoscopy due to worsening airspace disease. I will order a CT chest   LOS: 4 days    [facility], [place] 09/20/2012

## 2012-09-20 NOTE — Progress Notes (Signed)
error 

## 2012-09-20 NOTE — Progress Notes (Signed)
INFECTIOUS DISEASE PROGRESS NOTE  ID: Chad [name] is a 57 y.o. male with   Active Problems:   * No active hospital problems. *  Subjective: continued DOE and fever  Abtx:  Anti-infectives   Start     Dose/Rate Route Frequency Ordered Stop   09/19/12 1530  micafungin (MYCAMINE) 100 mg in sodium chloride 0.9 % 100 mL IVPB     100 mg 100 mL/hr over 1 Hours Intravenous Daily 09/19/12 1500     09/19/12 1400  vancomycin (VANCOCIN) 1,500 mg in sodium chloride 0.9 % 500 mL IVPB     1,500 mg 250 mL/hr over 120 Minutes Intravenous Every 8 hours 09/19/12 0736     09/18/12 0600  vancomycin (VANCOCIN) 1,250 mg in sodium chloride 0.9 % 250 mL IVPB  Status:  Discontinued     1,250 mg 166.7 mL/hr over 90 Minutes Intravenous Every 8 hours 09/18/12 0034 09/19/12 0736   09/17/12 1000  valACYclovir (VALTREX) tablet 500 mg     500 mg Oral Daily 09/16/12 1636     09/16/12 2359  vancomycin (VANCOCIN) IVPB 1000 mg/200 mL premix  Status:  Discontinued     1,000 mg 200 mL/hr over 60 Minutes Intravenous Every 8 hours 09/16/12 1750 09/18/12 0034   09/16/12 2359  aztreonam (AZACTAM) 2 g in dextrose 5 % 50 mL IVPB     2 g 100 mL/hr over 30 Minutes Intravenous Every 8 hours 09/16/12 1750     09/16/12 1630  vancomycin (VANCOCIN) 1,500 mg in sodium chloride 0.9 % 500 mL IVPB     1,500 mg 250 mL/hr over 120 Minutes Intravenous  Once 09/16/12 1626 09/16/12 2005   09/16/12 1630  aztreonam (AZACTAM) 2 g in dextrose 5 % 50 mL IVPB     2 g 100 mL/hr over 30 Minutes Intravenous  Once 09/16/12 1626 09/16/12 1800      Medications:  Scheduled: . aztreonam  2 g Intravenous Q8H  . feeding supplement  1 Container Oral TID BM  . micafungin (MYCAMINE) IV  100 mg Intravenous Daily  . valACYclovir  500 mg Oral Daily  . vancomycin  1,500 mg Intravenous Q8H    Objective: Vital signs in last 24 hours: Temp:  [98.2 F (36.8 C)-103 F (39.4 C)] 98.2 F (36.8 C) (03/09 0935) Pulse Rate:  [89-111] 89 (03/09  0512) Resp:  [16-18] 18 (03/09 0512) BP: (143-164)/(72-77) 144/74 mmHg (03/09 0512) SpO2:  [95 %-96 %] 95 % (03/09 0512)   General appearance: alert, cooperative and no distress Resp: rhonchi bilaterally Cardio: regular rate and rhythm GI: normal findings: bowel sounds normal and soft, non-tender  Lab Results  Recent Labs  09/18/12 0345 09/19/12 0505  WBC 4.0 3.6*  HGB 10.5* 9.6*  HCT 28.9* 26.7*  NA 132* 135  K 3.0* 2.9*  CL 100 103  CO2 22 21  BUN 7 6  CREATININE 1.00 0.80   Liver Panel No results found for this basename: PROT, ALBUMIN, AST, ALT, ALKPHOS, BILITOT, BILIDIR, IBILI,  in the last 72 hours Sedimentation Rate No results found for this basename: ESRSEDRATE,  in the last 72 hours C-Reactive Protein No results found for this basename: CRP,  in the last 72 hours  Microbiology: Recent Results (from the past 240 hour(s))  CULTURE, BLOOD (ROUTINE X 2)     Status: None   Collection Time    09/16/12  4:45 PM      Result Value Range Status   Specimen Description BLOOD LEFT  HAND   Final   Special Requests BOTTLES DRAWN AEROBIC AND ANAEROBIC 10CC   Final   Culture  Setup Time 09/16/2012 21:52   Final   Culture     Final   Value:        BLOOD CULTURE RECEIVED NO GROWTH TO DATE CULTURE WILL BE HELD FOR 5 DAYS BEFORE ISSUING A FINAL NEGATIVE REPORT   Report Status PENDING   Incomplete  CULTURE, BLOOD (ROUTINE X 2)     Status: None   Collection Time    09/16/12  4:52 PM      Result Value Range Status   Specimen Description BLOOD LEFT ARM   Final   Special Requests BOTTLES DRAWN AEROBIC AND ANAEROBIC 10CC   Final   Culture  Setup Time 09/16/2012 21:52   Final   Culture     Final   Value:        BLOOD CULTURE RECEIVED NO GROWTH TO DATE CULTURE WILL BE HELD FOR 5 DAYS BEFORE ISSUING A FINAL NEGATIVE REPORT   Report Status PENDING   Incomplete  URINE CULTURE     Status: None   Collection Time    09/16/12  6:09 PM      Result Value Range Status   Specimen  Description URINE, CLEAN CATCH   Final   Special Requests NONE   Final   Culture  Setup Time 09/17/2012 02:42   Final   Colony Count NO GROWTH   Final   Culture NO GROWTH   Final   Report Status 09/18/2012 FINAL   Final  CLOSTRIDIUM DIFFICILE BY PCR     Status: None   Collection Time    09/17/12  8:25 AM      Result Value Range Status   C difficile by pcr NEGATIVE  NEGATIVE Final  CULTURE, EXPECTORATED SPUTUM-ASSESSMENT     Status: None   Collection Time    09/18/12  8:45 AM      Result Value Range Status   Specimen Description SPUTUM   Final   Special Requests NONE   Final   Sputum evaluation     Final   Value: THIS SPECIMEN IS ACCEPTABLE. RESPIRATORY CULTURE REPORT TO FOLLOW.   Report Status 09/18/2012 FINAL   Final  CULTURE, RESPIRATORY (NON-EXPECTORATED)     Status: None   Collection Time    09/18/12  8:45 AM      Result Value Range Status   Specimen Description SPUTUM   Final   Special Requests NONE   Final   Gram Stain     Final   Value: NO WBC SEEN     RARE SQUAMOUS EPITHELIAL CELLS PRESENT     RARE GRAM NEGATIVE COCCI     RARE GRAM POSITIVE RODS     RARE GRAM POSITIVE COCCI IN PAIRS   Culture NORMAL OROPHARYNGEAL FLORA   Final   Report Status 09/20/2012 FINAL   Final    Studies/Results: Dg Bone Survey Met  09/18/2012  *RADIOLOGY REPORT*  Clinical Data: History multiple myeloma.  METASTATIC BONE SURVEY  Comparison: Osseous survey 10/19/2010 and 09/18/2011. CT chest 09/16/2012 and PA and lateral chest 09/11/2012.  Findings: Single view of the chest demonstrates right lower lobe airspace disease.  There is new airspace disease in the lingula. Heart size is normal.  No pneumothorax or pleural fluid is seen.  Tiny lucent lesions in the calvarium seen on most recent osseous survey are difficult to appreciate on today's examination.  There may be a  tiny lesion in the parietal bone.  Lytic lesions in the thoracic and lumbar spine seen on the prior study do not appear changed.  Small lesions in the pubic bones and femoral necks bilaterally are again seen and not markedly changed in appearance. Lesions in the left humerus and bilateral ribs are again identified.  Single lytic lesion in the proximal right humerus is noted.  No new lytic lesion is seen.  Since the prior osseous survey, the patient has developed a fracture of the right seventh rib.  The fracture appears subacute to remote with callus formation identified.  IMPRESSION:  1. Progression of airspace disease in the chest since the prior CT scan with opacity now identified in the lingula compatible with worsened pneumonia. 2.  Extensive myelomatous disease in the axial or appendicular skeleton does not appear markedly changed with exception of lesions in the calvarium which are not as well visualized on today's study. 3. Right seventh rib fracture appears subacute to remote but is new since the prior osseous survey.  These results will be called to the ordering clinician or representative by the Radiologist Assistant, and communication documented in the PACS Dashboard.   Original Report Authenticated By: Holley Dexter, M.D.      Assessment/Plan: MM Fever Pneumonia  Will check CMV and EBV on blood Have asked pulmonary to eval, Dr Kriste Basque is on call (his PCP) to consider bronch Will add clinda to his anbx (to cover anaerobes) Await Cx, PCP DFA  Total days of antibiotics:5 (vanco/aztreonam) Day 2 antifungal          Chad [name] Infectious Diseases [phone] 09/20/2012, 1:44 PM   LOS: 4 days

## 2012-09-20 NOTE — Consult Note (Signed)
Patient name: Orell Hurtado Medical record number: [id number] Date of birth: 1956/01/23 Age: 57 y.o. Gender: male PCP: Michele Mcalpine, MD  Date: 09/20/2012 Reason for Consult:  persistant fever, RLL airsp dis... Referring Physician:  Hal Neer, Ninetta Lights  HPI:  Tymir Terral is a 57y/o WM known to me & last seen 1/14 for yearly CPX>> OV note>>  ~ July 31, 2012: 44mo ROV & Francis Dowse has had an eventful interval> diagnosed w/ recurrent Myeloma 3/13 & treated w/ Revlimid, Decadron, Velcade chemoRx & referred back to [facility] 9/13 for another autologous StemCell transplant; he had a difficult post-Tx hosp w/ sepsis & was in the ICU but eventually recovered & now on maintenance Revlimid & Zometa from [facility]... We reviewed the following medical problems during today's office visit >>  Hx Pneumonia> bouts in 2009 & 2010, resolved w/ antibiotics, baseline CXR w/ bibasilar atx & scarring  HBP> on diet alone; BP= 140/90 but better at home & Dr[facility]rill's office ave 130s/80s; he denies CP, palpit, dizzy, SOB, edema...  CAD> on ASA81; known non-obstructive CAD (cath 2003) & he had Treadmill, 2DEcho, etc- all performed at [facility] prior to stem cell tx as part of a study & pt reports that they were OK...  CHOL> on diet alone & labs are OK w/ FLP showing TChol 185, TG 116, HDL 58, LDL 104  Overweight> his peak wt was ~250# in 2007; states he lost down to ~200# in Sep2013 w/ stem cell tx, now back to 218# & asked to diet, exercise, keep weight down...  GI- colon polyps> last colonoscopy was 2008 w/ adenomatous polyp removed & f/u planned 2yrs but holding off due to the recent MM recurrence & stem cell Tx...  Mult Myeloma> on Valtrex500/d for 38yr for shingles prophylaxis after stem cell tx; on Maintenance Revlimid, Zometa from [facility]...  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  Hx urticaria & angioedema> Hx ACE cough, rash from  Imipenum, then urticaria&angioedema from ?Avelox vs Diovan...  We reviewed prob list, meds, xrays and labs> see below for updates >> unable to get flu shot due to stem cell tx & chemoRx...  CXR 1/14 showed normal heart size, clear lungs, NAD...  LABS 1/14 showed FLP- at goals on diet w/ LDL=104; Chems- wnl w/ BS=101; CBC- wnl; TSH=1.18; PSA=0.50   Adm 09/16/12 w/ fever & cough, he had received outpt ZPak; Temps to 103, cough w/ scant greenish sputum, increasing SOB going to the bathroom;  Exam shows rales & rhonchi at bases R>L;  CXR 08/10/12 was clear, film from 2/28 showed NAD, CTChest 3/5 showed RLL airsp dis & mult lesions in the spine c/w MM;  Placed on Vanco & Aztreonam, ID added Mycafungin & Clindamycin; fevers continue to spike & he is currently going back to Radiology for another CTChest... Consult requested to consider [facility] w/ LAVAGE for culture material...   Past Medical History  Diagnosis Date  . Unspecified essential hypertension   . Coronary atherosclerosis of unspecified type of vessel, native or graft   . Pure hypercholesterolemia   . Overweight   . Benign neoplasm of colon   . Multiple myeloma, without mention of having achieved remission(203.00) 2004  . Urticaria, unspecified   . 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 Cancer 10/26/11 ct    multiple myelomatous lesion proximal right humerus,scaspula and ribs,  . History of chemotherapy 05/2003  high dose chemotherapy with autologus stem cell support/revlimid started 09/27/11  . Pneumonia 12/2007    hx  12/2007 & agian 01/06/09  . Allergy   . URI (upper respiratory infection) 08/2009    viral URI  . Neuropathy     hx intermittement tingling/numbness in feet r/t? revlimid, , resolved  . Neuropathy 10/22/11    intermittent tingling feet  again with revlimid therapy  . H/O cardiac catheterization 07/13/02  . H/O colonoscopy 04/2007    6mm desc colon polyp =adenomatous  . H/O stem cell transplant Nov. 2004    [facility]  hospital    Past Surgical History  Procedure Laterality Date  . Limbal stem cell transplant  2004, 2013    stem cell transplant in 2004, 2013    Family History  Problem Relation Age of Onset  . Cancer Father 90    lymphoma    Social History:  reports that he has never smoked. He has never used smokeless tobacco. He reports that  drinks alcohol. He reports that he does not use illicit drugs.  Allergies:  Allergies  Allergen Reactions  . Primaxin (Imipenem) Rash  . Cephalosporins   . Lisinopril     REACTION: ALLERGIC to ACE w/ cough...  . Moxifloxacin     Swollen lips  . Penicillins     rash  . Valsartan     REACTION: ALLERGIC to ARB w/ Urticaria \\T \ Angioedema    Medications:  . aztreonam  2 g Intravenous Q8H  . clindamycin (CLEOCIN) IV  600 mg Intravenous Q8H  . feeding supplement  1 Container Oral TID BM  . micafungin (MYCAMINE) IV  100 mg Intravenous Daily  . valACYclovir  500 mg Oral Daily  . vancomycin  1,500 mg Intravenous Q8H    ROS: See HPI - all other systems neg except as noted...  The patient notes decr appetite, fever, weakness, cough, scant sput & SOB/DOE. Denies weight change, vision loss, decreased hearing, hoarseness, chest pain, syncope, peripheral edema, headaches, hemoptysis, abdominal pain, melena, hematochezia, severe indigestion/heartburn, hematuria, incontinence, suspicious skin lesions, transient blindness, difficulty walking, depression, abnormal bleeding, enlarged lymph nodes, and angioedema.    Temp:  [98.2 F (36.8 C)-103.4 F (39.7 C)] 103.4 F (39.7 C) (03/09 1730) Pulse Rate:  [89-111] 94 (03/09 1440) Resp:  [16-18] 18 (03/09 1440) BP: (135-164)/(74-77) 135/74 mmHg (03/09 1440) SpO2:  [95 %-97 %] 97 % (03/09 1440)  Intake/Output Summary (Last 24 hours) at 09/20/12 1810 Last data filed at 09/20/12 1808  Gross per 24 hour  Intake   2500 ml  Output   4700 ml  Net  -2200 ml    Physical exam: WD, sl overweight, 57 y/o WM in  NAD...  GENERAL: Alert & oriented; pleasant & cooperative.  HEENT: [place]/AT, EOM-wnl, PERRLA, EACs-clear, TMs-wnl, NOSE-clear, THROAT-clear & wnl.  NECK: Supple w/ fairROM; no JVD; normal carotid impulses w/o bruits; no thyromegaly or nodules palpated; no lymphadenopathy.  CHEST: Bilat R>L basilar rales & rhonchi... HEART: Regular Rhythm; Gr1/6 SEM w/o rubs or gallops.  ABDOMEN: Soft & nontender; normal bowel sounds; no organomegaly or masses detected.  EXT: without deformities or arthritic changes; no varicose veins/ venous insuffic/ or edema.  NEURO: CN's intact; motor testing normal; sensory testing normal; gait normal & balance OK.  DERM: No lesions noted; no rash etc...    LAB RESULTS BMET    Component Value Date/Time   NA 134* 09/20/2012 1528   NA 137 08/28/2012 1027   K 3.1* 09/20/2012 1528  K 3.4* 08/28/2012 1027   CL 100 09/20/2012 1528   CL 103 08/28/2012 1027   CO2 22 09/20/2012 1528   CO2 21* 08/28/2012 1027   GLUCOSE 114* 09/20/2012 1528   GLUCOSE 103* 08/28/2012 1027   BUN 6 09/20/2012 1528   BUN 14.3 08/28/2012 1027   CREATININE 0.72 09/20/2012 1528   CREATININE 1.1 08/28/2012 1027   CALCIUM 7.6* 09/20/2012 1528   CALCIUM 8.5 08/28/2012 1027   GFRNONAA >90 09/20/2012 1528   GFRAA >90 09/20/2012 1528   CBC    Component Value Date/Time   WBC 1.3* 09/20/2012 1528   WBC 1.4* 09/16/2012 1230   RBC 2.99* 09/20/2012 1528   RBC 3.87* 09/16/2012 1230   HGB 10.0* 09/20/2012 1528   HGB 12.7* 09/16/2012 1230   HCT 27.5* 09/20/2012 1528   HCT 36.3* 09/16/2012 1230   PLT 75* 09/20/2012 1528   PLT 92* 09/16/2012 1230   MCV 92.0 09/20/2012 1528   MCV 93.8 09/16/2012 1230   MCH 33.4 09/20/2012 1528   MCH 32.8 09/16/2012 1230   MCHC 36.4* 09/20/2012 1528   MCHC 35.0 09/16/2012 1230   RDW 14.6 09/20/2012 1528   RDW 14.4 09/16/2012 1230   LYMPHSABS 0.3* 09/19/2012 0505   LYMPHSABS 0.6* 09/16/2012 1230   MONOABS 0.1 09/19/2012 0505   MONOABS 0.2 09/16/2012 1230   EOSABS 0.0 09/19/2012 0505   EOSABS 0.0 09/16/2012 1230   BASOSABS 0.0  09/19/2012 0505   BASOSABS 0.0 09/16/2012 1230   ABG No results found for this basename: phart, pco2, po2, hco3, tco2, acidbasedef, o2sat    Radiology:  Follow up CT Chest is pending...  Metastatic Bone Survey 3/7> IMPRESSION:  1. Progression of airspace disease in the chest since the prior CT scan with opacity now identified in the lingula compatible with worsened pneumonia.  2. Extensive myelomatous disease in the axial or appendicular skeleton does not appear markedly changed with exception of lesions in the calvarium which are not as well visualized on today's study.  3. Right seventh rib fracture appears subacute to remote but is new since the prior osseous survey.  CT Chest 3/5> IMPRESSION:  1. Right lower lobe lobar pneumonia.  2. Scattered ground-glass opacities within the left and right lung also suggest pulmonary infection.  3. No evidence of mediastinal lymphadenopathy.  4. Multiple lucent lesions within the spine consistent with myeloma.  CXR 2/28> IMPRESSION:  No acute disease.   Assessment and plan:  1)  Bilat pneumonia in an immunocompromised pt... 2)  Hx MM s/p 2 autologous stem cell transplants; last 9/13 at [facility]; evid for progressive disease on Bone survey & serum protein analysis... Plan:  - f/u CT chest pending now - continue current Ab choices per ID> Vanco, Aztreonam, Mycafungin, & Clindamycin... - we will arrange for Pulm team to bronch tomorrow afternoon after light breakfast in am...   Lonzo Cloud. Kriste Basque, MD 09/20/12 @ 6:34PM

## 2012-09-21 ENCOUNTER — Encounter (HOSPITAL_COMMUNITY)
Admission: AD | Disposition: A | Discharge status: Home / Self Care | Payer: Self-pay | Source: Ambulatory Visit | Attending: Oncology

## 2012-09-21 ENCOUNTER — Encounter (HOSPITAL_COMMUNITY): Payer: Self-pay | Admitting: Anesthesiology

## 2012-09-21 ENCOUNTER — Encounter (HOSPITAL_COMMUNITY): Payer: Self-pay | Admitting: Radiology

## 2012-09-21 ENCOUNTER — Inpatient Hospital Stay (HOSPITAL_COMMUNITY): Payer: BC Managed Care – PPO

## 2012-09-21 HISTORY — PX: VIDEO BRONCHOSCOPY: SHX5072

## 2012-09-21 LAB — CBC WITH DIFFERENTIAL/PLATELET
Basophils Absolute: 0 10*3/uL (ref 0.0–0.1)
Lymphocytes Relative: 23 % (ref 12–46)
Lymphs Abs: 0.2 10*3/uL — ABNORMAL LOW (ref 0.7–4.0)
Monocytes Relative: 8 % (ref 3–12)
Platelets: 84 10*3/uL — ABNORMAL LOW (ref 150–400)
RDW: 14.6 % (ref 11.5–15.5)
WBC: 1 10*3/uL — CL (ref 4.0–10.5)

## 2012-09-21 LAB — COMPREHENSIVE METABOLIC PANEL
Alkaline Phosphatase: 46 U/L (ref 39–117)
BUN: 8 mg/dL (ref 6–23)
Chloride: 99 mEq/L (ref 96–112)
GFR calc Af Amer: >90 mL/min (ref >90)
Glucose, Bld: 101 mg/dL — ABNORMAL HIGH (ref 70–99)
Potassium: 2.9 mEq/L — ABNORMAL LOW (ref 3.5–5.1)
Total Bilirubin: 0.5 mg/dL (ref 0.3–1.2)

## 2012-09-21 LAB — RESPIRATORY VIRUS PANEL
Influenza A H3: NOT DETECTED
Influenza B: NOT DETECTED
Metapneumovirus: NOT DETECTED
Parainfluenza 1: NOT DETECTED
Parainfluenza 2: NOT DETECTED
Rhinovirus: NOT DETECTED

## 2012-09-21 SURGERY — VIDEO BRONCHOSCOPY WITHOUT FLUORO
Anesthesia: Moderate Sedation | Laterality: Bilateral

## 2012-09-21 MED ORDER — FENTANYL CITRATE 0.05 MG/ML IJ SOLN
INTRAMUSCULAR | Status: DC | PRN
  Administered 2012-09-21: 50 ug via INTRAVENOUS

## 2012-09-21 MED ORDER — LIDOCAINE HCL 1 % IJ SOLN
INTRAMUSCULAR | Status: DC | PRN
  Administered 2012-09-21: 6 mL via RESPIRATORY_TRACT

## 2012-09-21 MED ORDER — VANCOMYCIN HCL 10 G IV SOLR
1500.0000 mg | Freq: Three times a day (TID) | INTRAVENOUS | Status: DC
  Administered 2012-09-21 – 2012-09-22 (×2): 1500 mg via INTRAVENOUS
  Filled 2012-09-21 (×4): qty 1500

## 2012-09-21 MED ORDER — LIDOCAINE HCL 2 % EX GEL
CUTANEOUS | Status: DC | PRN
  Administered 2012-09-21: 2

## 2012-09-21 MED ORDER — PHENYLEPHRINE HCL 0.25 % NA SOLN
NASAL | Status: DC | PRN
  Administered 2012-09-21: 2 via NASAL

## 2012-09-21 MED ORDER — FILGRASTIM 480 MCG/1.6ML IJ SOLN
480.0000 ug | Freq: Every day | INTRAMUSCULAR | Status: DC
  Administered 2012-09-21 – 2012-09-23 (×3): 480 ug via SUBCUTANEOUS
  Filled 2012-09-21 (×6): qty 1.6

## 2012-09-21 MED ORDER — MIDAZOLAM HCL 10 MG/2ML IJ SOLN
INTRAMUSCULAR | Status: DC | PRN
  Administered 2012-09-21 (×2): 2 mg via INTRAVENOUS

## 2012-09-21 MED ORDER — POTASSIUM CHLORIDE CRYS ER 20 MEQ PO TBCR
20.0000 meq | EXTENDED_RELEASE_TABLET | Freq: Three times a day (TID) | ORAL | Status: DC
  Administered 2012-09-21 – 2012-09-25 (×13): 20 meq via ORAL
  Filled 2012-09-21 (×15): qty 1

## 2012-09-21 NOTE — Procedures (Signed)
Bronchoscopy Procedure Note  Chad [name] is a 57 y.o. male   Indications:    Consent: Risks of procedure as well as the alternatives and risks of each were explained to the patient or surrogate. Consent for procedure obtained.   Time Out performed: Verified patient identification, verified procedure, site/side was marked, verified correct patient position, special equipment/implants available, medications/allergies/relevent history reviewed, required imaging and test results available.   Sedation: Versed4 mg, fentanyl 50 mcg   Anesthesia: topical anesthesia was applied to the nose and throat and 16 cc cc of 1% lidocaine were used during the course of the procedure   Procedure: After adequate sedation and topical anesthesia, the bronchoscope was introduced via the L naris and advanced into the posterior pharynx. Upper airway anatomy was normal. Vocal cords moved symmetrically. Further anesthesia was achieved with 1% lidocaine and the scope was advanced into the trachea. Airway anesthesia was completed with 1% lidocaine and an airway exam was performed. This revealed normal segmental airway anatomy. The bronchial mucosa was normal. There were moderate watery secretions. There were no endobronchial tumors, masses or foreign bodies. After completion of the airway exam the bronchoscope was removed.  Complications:  There were no complications noted. The patient tolerated the procedure well.   Specimens:  RML BAL 60 cc NS. 20 cc effluent - opaque, blood-tinged ABF, fungal, viral panel, GS, quant cx, cytology  Impression:  PNA in immunocompromised host   Recommendations:  F/U FOB results Abx per ID and Dr Winferd Humphrey, MD;  PCCM service; Mobile [phone]

## 2012-09-21 NOTE — Progress Notes (Signed)
IP PROGRESS NOTE  Subjective:   He reports increased shortness of breath over the past few days. He now has dyspnea with minimal ambulation. No nausea or diarrhea. His appetite has improved.  Objective: Vital signs in last 24 hours: Blood pressure 136/70, pulse 91, temperature 99.6 F (37.6 C), temperature source Oral, resp. rate 18, height 6' 0.05" (1.83 m), weight 211 lb 10.3 oz (96 kg), SpO2 96.00%.  Intake/Output from previous day: 03/09 0701 - 03/10 0700 In: 1440 [P.O.:1440] Out: 3800 [Urine:3800]  Physical Exam: HEENT: No thrush Lungs: Inspiratory rhonchi /rales throughout the right posterior chest and left lower posterior chest Cardiac: Regular rate and rhythm Extremities: No leg edema Abdomen: Nontender   Lab Results:  Recent Labs  09/19/12 0505 09/20/12 1528  WBC 3.6* 1.3*  HGB 9.6* 10.0*  HCT 26.7* 27.5*  PLT 65* 75*     BMET  Recent Labs  09/19/12 0505 09/20/12 1528  NA 135 134*  K 2.9* 3.1*  CL 103 100  CO2 21 22  GLUCOSE 102* 114*  BUN 6 6  CREATININE 0.80 0.72  CALCIUM 7.1* 7.6*    Studies/Results: Ct Chest W Contrast  09/21/2012  *RADIOLOGY REPORT*  Clinical Data: Fever and cough.  Multiple myeloma.  CT CHEST WITH CONTRAST  Technique:  Multidetector CT imaging of the chest was performed following the standard protocol during bolus administration of intravenous contrast.  Contrast: OMNIPAQUE IOHEXOL 300 MG/ML  SOLN  Comparison: 09/16/2012.  Findings: Mediastinal lymph nodes are not enlarged by CT size criteria.  No hilar or axillary adenopathy.  Heart size normal. Coronary artery calcification.  No pericardial effusion.  There may be a tiny hiatal hernia.  Interval worsening in multi focal patchy airspace consolidation and ground-glass, involving all lobes of both lungs.  No pleural fluid. Airway is unremarkable.  Incidental imaging of the upper abdomen shows no acute findings. Lytic lesions are seen throughout the visualized osseous  structures.  IMPRESSION:  1.  Multilobar airspace consolidation and ground-glass are most indicative of pneumonia, progressive from 09/16/2012. 2.  Osseous changes of multiple myeloma.   Original Report Authenticated By: Leanna Battles, M.D.     Medications: I have reviewed the patient's current medications.  Assessment/Plan:  1. Multiple myeloma-status post melphalan conditioning followed by autologous stem cell therapy in September of 2013, maintained on maintenance Revlimid prior to hospital admission 2. Pneumonia-admitted on 09/16/2012 and started on vancomycin/aztreonam, clindamycin and micafungin added by the infectious disease service. Cultures are negative to date. 3. Neutropenia/thrombocytopenia-most likely related to Revlimid and the acute infection, the neutropenia  resolved after 2 days of G-CSF, we will followup on the ANC from today and resume G-CSF as indicated 4. Loose stools on hospital admission-C. difficile toxin assay negative 5. Hypokalemia secondary to diarrhea-continue potassium in the IV fluids and orally  He has developed progressive pneumonia despite broad-spectrum antibiotic therapy. The plan is to proceed with a diagnostic bronchoscopy today. He will continue antibiotics under the direction of Dr. Ninetta Lights. We will followup on the influenza panel sent on 09/19/2012. We checked quantitative immunoglobulin levels today and will consider IVIG therapy. I will contact Dr. Lance Bosch on the bone marrow transplant service at [facility] to alert him of the hospital admission.   LOS: 5 days   [name], Jillyn Hidden  09/21/2012, 10:47 AM

## 2012-09-21 NOTE — Progress Notes (Signed)
INFECTIOUS DISEASE PROGRESS NOTE  ID: Chad [name] is a 57 y.o. male with   Active Problems:   * No active hospital problems. *  Subjective: S/p bronch. Still some DOE going to bathroom.  Normal BM  Abtx:  Anti-infectives   Start     Dose/Rate Route Frequency Ordered Stop   09/20/12 1400  clindamycin (CLEOCIN) IVPB 600 mg     600 mg 100 mL/hr over 30 Minutes Intravenous 3 times per day 09/20/12 1349     09/19/12 1530  micafungin (MYCAMINE) 100 mg in sodium chloride 0.9 % 100 mL IVPB     100 mg 100 mL/hr over 1 Hours Intravenous Daily 09/19/12 1500     09/19/12 1400  vancomycin (VANCOCIN) 1,500 mg in sodium chloride 0.9 % 500 mL IVPB     1,500 mg 250 mL/hr over 120 Minutes Intravenous Every 8 hours 09/19/12 0736     09/18/12 0600  vancomycin (VANCOCIN) 1,250 mg in sodium chloride 0.9 % 250 mL IVPB  Status:  Discontinued     1,250 mg 166.7 mL/hr over 90 Minutes Intravenous Every 8 hours 09/18/12 0034 09/19/12 0736   09/17/12 1000  valACYclovir (VALTREX) tablet 500 mg     500 mg Oral Daily 09/16/12 1636     09/16/12 2359  vancomycin (VANCOCIN) IVPB 1000 mg/200 mL premix  Status:  Discontinued     1,000 mg 200 mL/hr over 60 Minutes Intravenous Every 8 hours 09/16/12 1750 09/18/12 0034   09/16/12 2359  aztreonam (AZACTAM) 2 g in dextrose 5 % 50 mL IVPB     2 g 100 mL/hr over 30 Minutes Intravenous Every 8 hours 09/16/12 1750     09/16/12 1630  vancomycin (VANCOCIN) 1,500 mg in sodium chloride 0.9 % 500 mL IVPB     1,500 mg 250 mL/hr over 120 Minutes Intravenous  Once 09/16/12 1626 09/16/12 2005   09/16/12 1630  aztreonam (AZACTAM) 2 g in dextrose 5 % 50 mL IVPB     2 g 100 mL/hr over 30 Minutes Intravenous  Once 09/16/12 1626 09/16/12 1800      Medications:  Scheduled: . aztreonam  2 g Intravenous Q8H  . clindamycin (CLEOCIN) IV  600 mg Intravenous Q8H  . feeding supplement  1 Container Oral TID BM  . micafungin (MYCAMINE) IV  100 mg Intravenous Daily  . valACYclovir   500 mg Oral Daily  . vancomycin  1,500 mg Intravenous Q8H    Objective: Vital signs in last 24 hours: Temp:  [98.6 F (37 C)-103.4 F (39.7 C)] 99.6 F (37.6 C) (03/10 0500) Pulse Rate:  [90-115] 108 (03/10 1435) Resp:  [16-48] 39 (03/10 1435) BP: (134-182)/(70-101) 152/84 mmHg (03/10 1435) SpO2:  [89 %-97 %] 93 % (03/10 1435) Weight:  [200 lb (90.719 kg)] 200 lb (90.719 kg) (03/10 1251)   General appearance: alert, cooperative and no distress Resp: rhonchi bilaterally and mild Cardio: regular rate and rhythm GI: normal findings: bowel sounds normal and soft, non-tender  Lab Results  Recent Labs  09/19/12 0505 09/20/12 1528  WBC 3.6* 1.3*  HGB 9.6* 10.0*  HCT 26.7* 27.5*  NA 135 134*  K 2.9* 3.1*  CL 103 100  CO2 21 22  BUN 6 6  CREATININE 0.80 0.72   Liver Panel No results found for this basename: PROT, ALBUMIN, AST, ALT, ALKPHOS, BILITOT, BILIDIR, IBILI,  in the last 72 hours Sedimentation Rate No results found for this basename: ESRSEDRATE,  in the last 72 hours C-Reactive  Protein No results found for this basename: CRP,  in the last 72 hours  Microbiology: Recent Results (from the past 240 hour(s))  CULTURE, BLOOD (ROUTINE X 2)     Status: None   Collection Time    09/16/12  4:45 PM      Result Value Range Status   Specimen Description BLOOD LEFT HAND   Final   Special Requests BOTTLES DRAWN AEROBIC AND ANAEROBIC 10CC   Final   Culture  Setup Time 09/16/2012 21:52   Final   Culture     Final   Value:        BLOOD CULTURE RECEIVED NO GROWTH TO DATE CULTURE WILL BE HELD FOR 5 DAYS BEFORE ISSUING A FINAL NEGATIVE REPORT   Report Status PENDING   Incomplete  CULTURE, BLOOD (ROUTINE X 2)     Status: None   Collection Time    09/16/12  4:52 PM      Result Value Range Status   Specimen Description BLOOD LEFT ARM   Final   Special Requests BOTTLES DRAWN AEROBIC AND ANAEROBIC 10CC   Final   Culture  Setup Time 09/16/2012 21:52   Final   Culture     Final    Value:        BLOOD CULTURE RECEIVED NO GROWTH TO DATE CULTURE WILL BE HELD FOR 5 DAYS BEFORE ISSUING A FINAL NEGATIVE REPORT   Report Status PENDING   Incomplete  URINE CULTURE     Status: None   Collection Time    09/16/12  6:09 PM      Result Value Range Status   Specimen Description URINE, CLEAN CATCH   Final   Special Requests NONE   Final   Culture  Setup Time 09/17/2012 02:42   Final   Colony Count NO GROWTH   Final   Culture NO GROWTH   Final   Report Status 09/18/2012 FINAL   Final  CLOSTRIDIUM DIFFICILE BY PCR     Status: None   Collection Time    09/17/12  8:25 AM      Result Value Range Status   C difficile by pcr NEGATIVE  NEGATIVE Final  CULTURE, EXPECTORATED SPUTUM-ASSESSMENT     Status: None   Collection Time    09/18/12  8:45 AM      Result Value Range Status   Specimen Description SPUTUM   Final   Special Requests NONE   Final   Sputum evaluation     Final   Value: THIS SPECIMEN IS ACCEPTABLE. RESPIRATORY CULTURE REPORT TO FOLLOW.   Report Status 09/18/2012 FINAL   Final  CULTURE, RESPIRATORY (NON-EXPECTORATED)     Status: None   Collection Time    09/18/12  8:45 AM      Result Value Range Status   Specimen Description SPUTUM   Final   Special Requests NONE   Final   Gram Stain     Final   Value: NO WBC SEEN     RARE SQUAMOUS EPITHELIAL CELLS PRESENT     RARE GRAM NEGATIVE COCCI     RARE GRAM POSITIVE RODS     RARE GRAM POSITIVE COCCI IN PAIRS   Culture NORMAL OROPHARYNGEAL FLORA   Final   Report Status 09/20/2012 FINAL   Final  AFB CULTURE, BLOOD     Status: None   Collection Time    09/19/12  3:58 PM      Result Value Range Status   Specimen Description Blood  Final   Special Requests Immunocompromised   Final   Culture     Final   Value: CULTURE WILL BE EXAMINED FOR 6 WEEKS BEFORE ISSUING A FINAL REPORT   Report Status PENDING   Incomplete  CULTURE, FUNGUS WITHOUT SMEAR     Status: None   Collection Time    09/19/12  6:06 PM      Result Value  Range Status   Specimen Description SPUTUM   Final   Special Requests Immunocompromised   Final   Culture CULTURE IN PROGRESS FOR FOUR WEEKS   Final   Report Status PENDING   Incomplete    Studies/Results: Ct Chest W Contrast  09/21/2012  *RADIOLOGY REPORT*  Clinical Data: Fever and cough.  Multiple myeloma.  CT CHEST WITH CONTRAST  Technique:  Multidetector CT imaging of the chest was performed following the standard protocol during bolus administration of intravenous contrast.  Contrast: OMNIPAQUE IOHEXOL 300 MG/ML  SOLN  Comparison: 09/16/2012.  Findings: Mediastinal lymph nodes are not enlarged by CT size criteria.  No hilar or axillary adenopathy.  Heart size normal. Coronary artery calcification.  No pericardial effusion.  There may be a tiny hiatal hernia.  Interval worsening in multi focal patchy airspace consolidation and ground-glass, involving all lobes of both lungs.  No pleural fluid. Airway is unremarkable.  Incidental imaging of the upper abdomen shows no acute findings. Lytic lesions are seen throughout the visualized osseous structures.  IMPRESSION:  1.  Multilobar airspace consolidation and ground-glass are most indicative of pneumonia, progressive from 09/16/2012. 2.  Osseous changes of multiple myeloma.   Original Report Authenticated By: Leanna Battles, M.D.      Assessment/Plan: Myeloma  Fever  Pneumonia (worsening on CT 3-9 vs previous) Loose BM  Total days of antibiotics: 6 (vanco/aztreonam/clinda), day 3 anti-fungal (micafungin) Await fungal, afb, respiratory virus, PCP, routine, on BAL (visual normal) My great appreciation to pulmonary Checking immunoglobulin levels, consideration to starting IVIg Restarting neupogen  Add CMV DNA and EBV DNA to BAL fluid         [name] Infectious Diseases [phone] 09/21/2012, 2:52 PM   LOS: 5 days

## 2012-09-21 NOTE — Progress Notes (Signed)
FIBEROPTIC BRONCH WITH VIDEO INTERVENTION WITH BRONCHIAL WASHING.  Billy Fischer, MD ; [facility] [phone].  After 5:30 PM or weekends, call [phone]

## 2012-09-21 NOTE — H&P (Signed)
  I have reviewed medical records including radiology studies and examined patient and deem him to be suitable for conscious sedation and bronchoscopy. Planned procedure is RML BAL   Billy Fischer, MD ; [facility] [phone].  After 5:30 PM or weekends, call [phone]

## 2012-09-22 ENCOUNTER — Encounter (HOSPITAL_COMMUNITY): Payer: Self-pay | Admitting: Pulmonary Disease

## 2012-09-22 ENCOUNTER — Inpatient Hospital Stay (HOSPITAL_COMMUNITY): Payer: BC Managed Care – PPO

## 2012-09-22 LAB — CBC WITH DIFFERENTIAL/PLATELET
Basophils Absolute: 0 10*3/uL (ref 0.0–0.1)
Eosinophils Absolute: 0 10*3/uL (ref 0.0–0.7)
HCT: 27.1 % — ABNORMAL LOW (ref 39.0–52.0)
Lymphs Abs: 0.3 10*3/uL — ABNORMAL LOW (ref 0.7–4.0)
MCH: 32.1 pg (ref 26.0–34.0)
MCHC: 35.4 g/dL (ref 30.0–36.0)
MCV: 90.6 fL (ref 78.0–100.0)
Monocytes Absolute: 0.1 10*3/uL (ref 0.1–1.0)
Monocytes Relative: 4 % (ref 3–12)
Neutro Abs: 3.2 10*3/uL (ref 1.7–7.7)
RDW: 14.8 % (ref 11.5–15.5)

## 2012-09-22 LAB — COMPREHENSIVE METABOLIC PANEL
Alkaline Phosphatase: 55 U/L (ref 39–117)
BUN: 8 mg/dL (ref 6–23)
GFR calc Af Amer: >90 mL/min (ref >90)
Glucose, Bld: 98 mg/dL (ref 70–99)
Potassium: 3 mEq/L — ABNORMAL LOW (ref 3.5–5.1)
Total Bilirubin: 0.6 mg/dL (ref 0.3–1.2)
Total Protein: 6.5 g/dL (ref 6.0–8.3)

## 2012-09-22 LAB — CULTURE, BLOOD (ROUTINE X 2)
Culture: NO GROWTH
Culture: NO GROWTH

## 2012-09-22 LAB — VANCOMYCIN, TROUGH: Vancomycin Tr: 12.3 ug/mL (ref 10.0–20.0)

## 2012-09-22 LAB — IGG, IGA, IGM
IgA: 141 mg/dL (ref 68–379)
IgG (Immunoglobin G), Serum: 853 mg/dL (ref 650–1600)

## 2012-09-22 LAB — PNEUMOCYSTIS JIROVECI SMEAR BY DFA

## 2012-09-22 MED ORDER — VANCOMYCIN HCL 10 G IV SOLR
1750.0000 mg | Freq: Three times a day (TID) | INTRAVENOUS | Status: DC
  Administered 2012-09-22 – 2012-09-25 (×9): 1750 mg via INTRAVENOUS
  Filled 2012-09-22 (×11): qty 1750

## 2012-09-22 MED ORDER — SODIUM CHLORIDE 0.9 % IV SOLN
INTRAVENOUS | Status: DC
  Administered 2012-09-22 – 2012-09-24 (×4): via INTRAVENOUS
  Filled 2012-09-22 (×8): qty 1000

## 2012-09-22 NOTE — Progress Notes (Signed)
Peripherally Inserted Central Catheter/Midline Placement  The IV Nurse has discussed with the patient and/or persons authorized to consent for the patient, the purpose of this procedure and the potential benefits and risks involved with this procedure.  The benefits include less needle sticks, lab draws from the catheter and patient may be discharged home with the catheter.  Risks include, but not limited to, infection, bleeding, blood clot (thrombus formation), and puncture of an artery; nerve damage and irregular heat beat.  Alternatives to this procedure were also discussed.  PICC/Midline Placement Documentation        Vevelyn Pat 09/22/2012, 9:26 AM

## 2012-09-22 NOTE — Progress Notes (Signed)
INFECTIOUS DISEASE PROGRESS NOTE  ID: Chad [name] is a 57 y.o. male with  Active Problems:   * No active hospital problems. *  Subjective: Without complaints, normal BM  Abtx:  Anti-infectives   Start     Dose/Rate Route Frequency Ordered Stop   09/21/12 2100  vancomycin (VANCOCIN) 1,500 mg in sodium chloride 0.9 % 500 mL IVPB     1,500 mg 250 mL/hr over 120 Minutes Intravenous Every 8 hours 09/21/12 2015     09/20/12 1400  clindamycin (CLEOCIN) IVPB 600 mg     600 mg 100 mL/hr over 30 Minutes Intravenous 3 times per day 09/20/12 1349     09/19/12 1530  micafungin (MYCAMINE) 100 mg in sodium chloride 0.9 % 100 mL IVPB     100 mg 100 mL/hr over 1 Hours Intravenous Daily 09/19/12 1500     09/19/12 1400  vancomycin (VANCOCIN) 1,500 mg in sodium chloride 0.9 % 500 mL IVPB  Status:  Discontinued     1,500 mg 250 mL/hr over 120 Minutes Intravenous Every 8 hours 09/19/12 0736 09/21/12 2015   09/18/12 0600  vancomycin (VANCOCIN) 1,250 mg in sodium chloride 0.9 % 250 mL IVPB  Status:  Discontinued     1,250 mg 166.7 mL/hr over 90 Minutes Intravenous Every 8 hours 09/18/12 0034 09/19/12 0736   09/17/12 1000  valACYclovir (VALTREX) tablet 500 mg     500 mg Oral Daily 09/16/12 1636     09/16/12 2359  vancomycin (VANCOCIN) IVPB 1000 mg/200 mL premix  Status:  Discontinued     1,000 mg 200 mL/hr over 60 Minutes Intravenous Every 8 hours 09/16/12 1750 09/18/12 0034   09/16/12 2359  aztreonam (AZACTAM) 2 g in dextrose 5 % 50 mL IVPB     2 g 100 mL/hr over 30 Minutes Intravenous Every 8 hours 09/16/12 1750     09/16/12 1630  vancomycin (VANCOCIN) 1,500 mg in sodium chloride 0.9 % 500 mL IVPB     1,500 mg 250 mL/hr over 120 Minutes Intravenous  Once 09/16/12 1626 09/16/12 2005   09/16/12 1630  aztreonam (AZACTAM) 2 g in dextrose 5 % 50 mL IVPB     2 g 100 mL/hr over 30 Minutes Intravenous  Once 09/16/12 1626 09/16/12 1800      Medications:  Scheduled: . aztreonam  2 g Intravenous Q8H   . clindamycin (CLEOCIN) IV  600 mg Intravenous Q8H  . feeding supplement  1 Container Oral TID BM  . filgrastim  480 mcg Subcutaneous q1800  . micafungin [facility]) IV  100 mg Intravenous Daily  . potassium chloride  20 mEq Oral TID  . valACYclovir  500 mg Oral Daily  . vancomycin  1,500 mg Intravenous Q8H    Objective: Vital signs in last 24 hours: Temp:  [98.8 F (37.1 C)-102.7 F (39.3 C)] 98.8 F (37.1 C) (03/11 0537) Pulse Rate:  [90-115] 90 (03/11 0537) Resp:  [22-48] 24 (03/11 0537) BP: (139-182)/(76-101) 139/76 mmHg (03/11 0537) SpO2:  [88 %-95 %] 91 % (03/11 0537) Weight:  [90.719 kg (200 lb)] 90.719 kg (200 lb) (03/10 1251)   General appearance: alert, cooperative and no distress Resp: clear to auscultation bilaterally Cardio: regular rate and rhythm GI: normal findings: bowel sounds normal and soft, non-tender Extremities: RUE PIC clean, nontender  Lab Results  Recent Labs  09/21/12 1545 09/22/12 0411  WBC 1.0* 3.6*  HGB 9.1* 9.6*  HCT 25.0* 27.1*  NA 134* 139  K 2.9* 3.0*  CL  99 103  CO2 22 22  BUN 8 8  CREATININE 0.68 0.74   Liver Panel  Recent Labs  09/21/12 1545 09/22/12 0411  PROT 6.3 6.5  ALBUMIN 2.4* 2.3*  AST 59* 67*  ALT 36 46  ALKPHOS 46 55  BILITOT 0.5 0.6   Sedimentation Rate No results found for this basename: ESRSEDRATE,  in the last 72 hours C-Reactive Protein No results found for this basename: CRP,  in the last 72 hours  Microbiology:   Studies/Results: Ct Chest W Contrast  09/21/2012  *RADIOLOGY REPORT*  Clinical Data: Fever and cough.  Multiple myeloma.  CT CHEST WITH CONTRAST  Technique:  Multidetector CT imaging of the chest was performed following the standard protocol during bolus administration of intravenous contrast.  Contrast: OMNIPAQUE IOHEXOL 300 MG/ML  SOLN  Comparison: 09/16/2012.  Findings: Mediastinal lymph nodes are not enlarged by CT size criteria.  No hilar or axillary adenopathy.  Heart size  normal. Coronary artery calcification.  No pericardial effusion.  There may be a tiny hiatal hernia.  Interval worsening in multi focal patchy airspace consolidation and ground-glass, involving all lobes of both lungs.  No pleural fluid. Airway is unremarkable.  Incidental imaging of the upper abdomen shows no acute findings. Lytic lesions are seen throughout the visualized osseous structures.  IMPRESSION:  1.  Multilobar airspace consolidation and ground-glass are most indicative of pneumonia, progressive from 09/16/2012. 2.  Osseous changes of multiple myeloma.   Original Report Authenticated By: Leanna Battles, M.D.      Assessment/Plan: Myeloma  Fever  Pneumonia (worsening on CT 3-9 vs previous)  Loose BM  Total days of antibiotics: 7 (vanco/aztreonam/clinda), day 4 anti-fungal (micafungin)   Appears to be "holding his own".  Fever down so far today Await BAL: fungal, afb, respiratory virus, routine,CMV DNA and EBV DNA, (visual normal)  His respiratory virus panel from nasal swab 3-8 is (-) My great appreciation to pulmonary  Immunoglobulin levels normal, consideration to starting IVIg if he clinically changes  WBC better on neupogen  PCP negative   [name] Infectious Diseases [phone] 09/22/2012, 11:12 AM   LOS: 6 days

## 2012-09-22 NOTE — Progress Notes (Signed)
ANTIBIOTIC CONSULT NOTE - FOLLOW UP  Pharmacy Consult for Vancomycin, Aztreonam Indication: neutropenic fever  Allergies  Allergen Reactions  . Primaxin (Imipenem) Rash  . Cephalosporins   . Lisinopril     REACTION: ALLERGIC to ACE w/ cough...  . Moxifloxacin     Swollen lips  . Penicillins     rash  . Valsartan     REACTION: ALLERGIC to ARB w/ Urticaria \\T \ Angioedema   Patient Measurements: Height: 6' (182.9 cm) Weight: 200 lb (90.719 kg) IBW/kg (Calculated) : 77.6  Vital Signs: Temp: 98.8 F (37.1 C) (03/11 0537) Temp src: Oral (03/11 0537) BP: 139/76 mmHg (03/11 0537) Pulse Rate: 90 (03/11 0537)  Labs:  Recent Labs  09/20/12 1528 09/21/12 1545 09/22/12 0411  WBC 1.3* 1.0* 3.6*  HGB 10.0* 9.1* 9.6*  PLT 75* 84* 76*  CREATININE 0.72 0.68 0.74   Estimated Creatinine Clearance: 110.5 ml/min (by C-G formula based on Cr of 0.74).  Recent Labs  09/22/12 1245  VANCOTROUGH 12.3    Microbiology:  Anti-infectives: 3/5 >> Vancomycin >> 3/5 >> Aztreonam >> 3/6 >> Valacyclovir >> 3/8 >> Micafungin/ID >> 3/9 >> Clindamycin/ID >>  Assessment:  63 yom with h/o multiple myeloma s/p autologous stem cell support in 2013 currently on maintenance Revlimid. Pt presented to the cancer center 2/28 with persistent sinus symptoms, fever, cough and was prescribed Z-pak (unremarkable CXR). Pt completed Z-prak 3/4 but fever and non-productive cough persisted. Pt admitted to hospital 3/5 and started on broad spectrum IV abx.  Day #7 Vancomycin and Aztreonam.  Day 3 Clindamycin, D4 Micafungin  Cultures remain negative  Renal function is stable/improved, with CrCl > 100 ml/min (CG and normalized)  Vancomycin trough level = 12.3 after dose increase to 1500mg  q8hr  No positive cultures. Bronch 3/10: PCP negative, Legionella negative, no yeast.  Goal of Therapy:  Vancomycin trough level 15-20 mcg/ml  Plan:   Continue Aztreonam 2g IV q8h  Increase to Vancomycin 1750mg   IV q8h.  Re-check Vanc trough as needed  Follow up renal fxn and culture results.  Otho Bellows PharmD Pager [phone] 09/22/2012, 2:05 PM

## 2012-09-22 NOTE — Progress Notes (Signed)
Spoke to [place] in cytology to verify that a CMV dna ultrquant, pcr had been sent from the bronchoscopy on 3/10 (order was still in as needed to be collected last night). She stated it had and that it was listed under miscellaneous order.

## 2012-09-22 NOTE — Progress Notes (Signed)
IP PROGRESS NOTE  Subjective:   He continues to have exertional dyspnea and anorexia. He believes the fever spikes are coming less frequently.  Objective: Vital signs in last 24 hours: Blood pressure 139/76, pulse 90, temperature 98.8 F (37.1 C), temperature source Oral, resp. rate 24, height 6' (1.829 m), weight 200 lb (90.719 kg), SpO2 91.00%.  Intake/Output from previous day: 03/10 0701 - 03/11 0700 In: 3962 [P.O.:720; I.V.:2142; IV Piggyback:1100] Out: 3375 [Urine:3375]  Physical Exam: HEENT: No thrush Lungs: Inspiratory rhonchi /rales throughout the left and right posterior chest Cardiac: Regular rate and rhythm Extremities: No leg edema Abdomen: Nontender   Lab Results:  Recent Labs  09/21/12 1545 09/22/12 0411  WBC 1.0* 3.6*  HGB 9.1* 9.6*  HCT 25.0* 27.1*  PLT 84* 76*   ANC 3.2  BMET  Recent Labs  09/21/12 1545 09/22/12 0411  NA 134* 139  K 2.9* 3.0*  CL 99 103  CO2 22 22  GLUCOSE 101* 98  BUN 8 8  CREATININE 0.68 0.74  CALCIUM 7.4* 7.5*    Studies/Results: Ct Chest W Contrast  09/21/2012  *RADIOLOGY REPORT*  Clinical Data: Fever and cough.  Multiple myeloma.  CT CHEST WITH CONTRAST  Technique:  Multidetector CT imaging of the chest was performed following the standard protocol during bolus administration of intravenous contrast.  Contrast: OMNIPAQUE IOHEXOL 300 MG/ML  SOLN  Comparison: 09/16/2012.  Findings: Mediastinal lymph nodes are not enlarged by CT size criteria.  No hilar or axillary adenopathy.  Heart size normal. Coronary artery calcification.  No pericardial effusion.  There may be a tiny hiatal hernia.  Interval worsening in multi focal patchy airspace consolidation and ground-glass, involving all lobes of both lungs.  No pleural fluid. Airway is unremarkable.  Incidental imaging of the upper abdomen shows no acute findings. Lytic lesions are seen throughout the visualized osseous structures.  IMPRESSION:  1.  Multilobar airspace  consolidation and ground-glass are most indicative of pneumonia, progressive from 09/16/2012. 2.  Osseous changes of multiple myeloma.   Original Report Authenticated By: Leanna Battles, M.D.     Medications: I have reviewed the patient's current medications.  Assessment/Plan:  1. Multiple myeloma-status post melphalan conditioning followed by autologous stem cell therapy in September of 2013, maintained on maintenance Revlimid prior to hospital admission 2. Pneumonia-admitted on 09/16/2012 and started on vancomycin/aztreonam. clindamycin and micafungin added by the infectious disease service. Cultures are negative to date. 3. Neutropenia/thrombocytopenia-most likely related to Revlimid and the acute infection, the neutropenia  resolved after 2 days of G-CSF, G-CSF was resumed 09/21/2012 and the neutrophil count is higher today. 4. Loose stools on hospital admission-C. difficile toxin assay negative 5. Hypokalemia secondary to diarrhea-the potassium continues to be low. I will increase the potassium in the IV fluids.  He appears stable today. We will followup on the culture data from the bronchoscopy 09/21/2012. Continue broad-spectrum antibiotics per the infectious disease service. The IgM returned in the normal range.   LOS: 6 days   [name], Jillyn Hidden  09/22/2012, 2:13 PM

## 2012-09-23 DIAGNOSIS — J1100 Influenza due to unidentified influenza virus with unspecified type of pneumonia: Secondary | ICD-10-CM

## 2012-09-23 DIAGNOSIS — J111 Influenza due to unidentified influenza virus with other respiratory manifestations: Secondary | ICD-10-CM

## 2012-09-23 LAB — BASIC METABOLIC PANEL
Calcium: 7.2 mg/dL — ABNORMAL LOW (ref 8.4–10.5)
Chloride: 106 mEq/L (ref 96–112)
Creatinine, Ser: 0.56 mg/dL (ref 0.50–1.35)
GFR calc Af Amer: >90 mL/min (ref >90)

## 2012-09-23 LAB — CBC WITH DIFFERENTIAL/PLATELET
Basophils Absolute: 0 10*3/uL (ref 0.0–0.1)
Lymphocytes Relative: 5 % — ABNORMAL LOW (ref 12–46)
Monocytes Relative: 1 % — ABNORMAL LOW (ref 3–12)
Neutrophils Relative %: 93 % — ABNORMAL HIGH (ref 43–77)
Platelets: 52 10*3/uL — ABNORMAL LOW (ref 150–400)
RDW: 15 % (ref 11.5–15.5)
WBC: 6.2 10*3/uL (ref 4.0–10.5)

## 2012-09-23 LAB — RESPIRATORY VIRUS PANEL
Influenza A H3: NOT DETECTED
Influenza A: DETECTED — AB
Influenza B: NOT DETECTED
Parainfluenza 1: NOT DETECTED
Parainfluenza 2: NOT DETECTED
Respiratory Syncytial Virus B: NOT DETECTED

## 2012-09-23 LAB — MAGNESIUM: Magnesium: 2 mg/dL (ref 1.5–2.5)

## 2012-09-23 MED ORDER — OSELTAMIVIR PHOSPHATE 75 MG PO CAPS
75.0000 mg | ORAL_CAPSULE | Freq: Two times a day (BID) | ORAL | Status: DC
  Administered 2012-09-23 – 2012-09-27 (×9): 75 mg via ORAL
  Filled 2012-09-23 (×10): qty 1

## 2012-09-23 NOTE — Progress Notes (Addendum)
INFECTIOUS DISEASE PROGRESS NOTE  ID: Chad [name] is a 57 y.o. male with   Active Problems:   Influenza A  Subjective: Feels about the same. Continued DOE on getting up to go to the bathroom.   Abtx:  Anti-infectives   Start     Dose/Rate Route Frequency Ordered Stop   09/23/12 1230  oseltamivir (TAMIFLU) capsule 75 mg     75 mg Oral 2 times daily 09/23/12 1140 09/28/12 0959   09/22/12 1430  vancomycin (VANCOCIN) 1,750 mg in sodium chloride 0.9 % 500 mL IVPB     1,750 mg 250 mL/hr over 120 Minutes Intravenous 3 times per day 09/22/12 1408     09/21/12 2100  vancomycin (VANCOCIN) 1,500 mg in sodium chloride 0.9 % 500 mL IVPB  Status:  Discontinued     1,500 mg 250 mL/hr over 120 Minutes Intravenous Every 8 hours 09/21/12 2015 09/22/12 1408   09/20/12 1400  clindamycin (CLEOCIN) IVPB 600 mg  Status:  Discontinued     600 mg 100 mL/hr over 30 Minutes Intravenous 3 times per day 09/20/12 1349 09/23/12 1140   09/19/12 1530  micafungin (MYCAMINE) 100 mg in sodium chloride 0.9 % 100 mL IVPB  Status:  Discontinued     100 mg 100 mL/hr over 1 Hours Intravenous Daily 09/19/12 1500 09/23/12 1140   09/19/12 1400  vancomycin (VANCOCIN) 1,500 mg in sodium chloride 0.9 % 500 mL IVPB  Status:  Discontinued     1,500 mg 250 mL/hr over 120 Minutes Intravenous Every 8 hours 09/19/12 0736 09/21/12 2015   09/18/12 0600  vancomycin (VANCOCIN) 1,250 mg in sodium chloride 0.9 % 250 mL IVPB  Status:  Discontinued     1,250 mg 166.7 mL/hr over 90 Minutes Intravenous Every 8 hours 09/18/12 0034 09/19/12 0736   09/17/12 1000  valACYclovir (VALTREX) tablet 500 mg     500 mg Oral Daily 09/16/12 1636     09/16/12 2359  vancomycin (VANCOCIN) IVPB 1000 mg/200 mL premix  Status:  Discontinued     1,000 mg 200 mL/hr over 60 Minutes Intravenous Every 8 hours 09/16/12 1750 09/18/12 0034   09/16/12 2359  aztreonam (AZACTAM) 2 g in dextrose 5 % 50 mL IVPB     2 g 100 mL/hr over 30 Minutes Intravenous Every 8  hours 09/16/12 1750     09/16/12 1630  vancomycin (VANCOCIN) 1,500 mg in sodium chloride 0.9 % 500 mL IVPB     1,500 mg 250 mL/hr over 120 Minutes Intravenous  Once 09/16/12 1626 09/16/12 2005   09/16/12 1630  aztreonam (AZACTAM) 2 g in dextrose 5 % 50 mL IVPB     2 g 100 mL/hr over 30 Minutes Intravenous  Once 09/16/12 1626 09/16/12 1800      Medications:  Scheduled: . aztreonam  2 g Intravenous Q8H  . feeding supplement  1 Container Oral TID BM  . filgrastim  480 mcg Subcutaneous q1800  . oseltamivir  75 mg Oral BID  . potassium chloride  20 mEq Oral TID  . valACYclovir  500 mg Oral Daily  . vancomycin  1,750 mg Intravenous Q8H    Objective: Vital signs in last 24 hours: Temp:  [98.9 F (37.2 C)-99.8 F (37.7 C)] 99 F (37.2 C) (03/12 1322) Pulse Rate:  [87-89] 87 (03/12 1322) Resp:  [18-21] 21 (03/12 1322) BP: (140-145)/(70-74) 144/70 mmHg (03/12 1322) SpO2:  [95 %-97 %] 97 % (03/12 1322)   Resp: rhonchi bilaterally Cardio: regular rate and  rhythm GI: normal findings: bowel sounds normal and soft, non-tender  Lab Results  Recent Labs  09/22/12 0411 09/23/12 0525  WBC 3.6* 6.2  HGB 9.6* 11.2*  HCT 27.1* 31.2*  NA 139 138  K 3.0* 2.9*  CL 103 106  CO2 22 22  BUN 8 6  CREATININE 0.74 0.56   Liver Panel  Recent Labs  09/21/12 1545 09/22/12 0411  PROT 6.3 6.5  ALBUMIN 2.4* 2.3*  AST 59* 67*  ALT 36 46  ALKPHOS 46 55  BILITOT 0.5 0.6   Sedimentation Rate No results found for this basename: ESRSEDRATE,  in the last 72 hours C-Reactive Protein No results found for this basename: CRP,  in the last 72 hours  Microbiology: Respiratory virus panel: + Influenza A  Studies/Results: Dg Chest 2 View  09/22/2012  *RADIOLOGY REPORT*  Clinical Data: Cough, evaluate infiltrates, history coronary disease, multiple myeloma  CHEST - 2 VIEW  Comparison: 09/11/2012 Correlation:  CT chest 09/20/2012  Findings: Right arm PICC line tip projecting over SVC.  Upper-normal size of cardiac silhouette. Mediastinal contours and pulmonary vascularity normal. Patchy infiltrates perihilar and bilateral lung bases not significantly changed since interval CT. Right upper lobe infiltrate has perhaps slightly increased since the CT exam. No gross pleural effusion or pneumothorax. No acute osseous findings.  IMPRESSION: Patchy bilateral airspace infiltrates, question slightly increased at right apex.   Original Report Authenticated By: Ulyses Southward, M.D.      Assessment/Plan: Myeloma  Influenza/Pneumonia (worsening on CT 3-9 vs previous)  Loose BM  Total days of antibiotics: 8 (vanco/aztreonam/clinda), day 5 anti-fungal (micafungin)  I stopped his antifungal, clinda. Started tamiflu- efficacy after 48 is unclear (in normal hosts, which he is not).  I amhopeful that he is improving... Fever staying down. WBC better. Clinically he is about the same Await BAL: CMV DNA and EBV DNA (serum -)  My great appreciation to pulmonary   Explained to pt, family       [name] Infectious Diseases [phone] 09/23/2012, 2:56 PM   LOS: 7 days

## 2012-09-23 NOTE — Progress Notes (Signed)
IP PROGRESS NOTE  Subjective:   He reports stable exertional dyspnea. The fever has improved.  Objective: Vital signs in last 24 hours: Blood pressure 144/70, pulse 87, temperature 99 F (37.2 C), temperature source Oral, resp. rate 21, height 6' (1.829 m), weight 200 lb (90.719 kg), SpO2 97.00%.  Intake/Output from previous day: 03/11 0701 - 03/12 0700 In: 1884.5 [P.O.:480; I.V.:1354.5; IV Piggyback:50] Out: 2625 [Urine:2625]  Physical Exam: HEENT: No thrush Lungs: Inspiratory rhonchi /rales throughout the left and right posterior chest Cardiac: Regular rate and rhythm Extremities: No leg edema Abdomen: Nontender Right PICC without erythema  Lab Results:  Recent Labs  09/22/12 0411 09/23/12 0525  WBC 3.6* 6.2  HGB 9.6* 11.2*  HCT 27.1* 31.2*  PLT 76* 52*   ANC 5.7  BMET  Recent Labs  09/22/12 0411 09/23/12 0525  NA 139 138  K 3.0* 2.9*  CL 103 106  CO2 22 22  GLUCOSE 98 98  BUN 8 6  CREATININE 0.74 0.56  CALCIUM 7.5* 7.2*    Studies/Results: Dg Chest 2 View  09/22/2012  *RADIOLOGY REPORT*  Clinical Data: Cough, evaluate infiltrates, history coronary disease, multiple myeloma  CHEST - 2 VIEW  Comparison: 09/11/2012 Correlation:  CT chest 09/20/2012  Findings: Right arm PICC line tip projecting over SVC. Upper-normal size of cardiac silhouette. Mediastinal contours and pulmonary vascularity normal. Patchy infiltrates perihilar and bilateral lung bases not significantly changed since interval CT. Right upper lobe infiltrate has perhaps slightly increased since the CT exam. No gross pleural effusion or pneumothorax. No acute osseous findings.  IMPRESSION: Patchy bilateral airspace infiltrates, question slightly increased at right apex.   Original Report Authenticated By: Ulyses Southward, M.D.     Medications: I have reviewed the patient's current medications.  Assessment/Plan:  1. Multiple myeloma-status post melphalan conditioning followed by autologous stem cell  therapy in September of 2013, maintained on maintenance Revlimid prior to hospital admission 2. Pneumonia-admitted on 09/16/2012 and started on vancomycin/aztreonam. clindamycin and micafungin added by the infectious disease service. Influenza A. he was detected on the bronchoalveolar lavage sample from 09/21/2012  3. Neutropenia/thrombocytopenia-most likely related to Revlimid and the acute infection, the neutropenia  resolved after 2 days of G-CSF, G-CSF was resumed 09/21/2012 and the neutrophil count is higher today. The platelet count remains low. G-CSF will be discontinued if the white count is higher 09/24/2012 4. Loose stools on hospital admission-C. difficile toxin assay negative 5. Hypokalemia secondary to diarrhea-the potassium continues to be low despite oral and IV replacement. We will check a magnesium level. He appears slightly improved today. Antiviral therapy for influenza A. to be recommended by Dr. Ninetta Lights.   LOS: 7 days   [name], Chad Hidden  09/23/2012, 1:52 PM

## 2012-09-23 NOTE — Progress Notes (Signed)
SUBJ:  No new complaints. Remains dyspneic with ambulation   OBJ:  Filed Vitals:   09/22/12 1446 09/22/12 2131 09/23/12 0558 09/23/12 1322  BP: 155/84 145/74 140/72 144/70  Pulse: 91 88 89 87  Temp: 97.7 F (36.5 C) 98.9 F (37.2 C) 99.8 F (37.7 C) 99 F (37.2 C)  TempSrc: Oral Oral Oral Oral  Resp: 20 20 18 21   Height:      Weight:      SpO2: 96% 97% 95% 97%    2 lpm [place]  Mild dyspnea @ rest after recent ambulation to restroom HEENT WNL No JVD Bilateral rales RRR s M NABS No edema   BMET    Component Value Date/Time   NA 138 09/23/2012 0525   NA 137 08/28/2012 1027   K 2.9* 09/23/2012 0525   K 3.4* 08/28/2012 1027   CL 106 09/23/2012 0525   CL 103 08/28/2012 1027   CO2 22 09/23/2012 0525   CO2 21* 08/28/2012 1027   GLUCOSE 98 09/23/2012 0525   GLUCOSE 103* 08/28/2012 1027   BUN 6 09/23/2012 0525   BUN 14.3 08/28/2012 1027   CREATININE 0.56 09/23/2012 0525   CREATININE 1.1 08/28/2012 1027   CALCIUM 7.2* 09/23/2012 0525   CALCIUM 8.5 08/28/2012 1027   GFRNONAA >90 09/23/2012 0525   GFRAA >90 09/23/2012 0525    CBC    Component Value Date/Time   WBC 6.2 09/23/2012 0525   WBC 1.4* 09/16/2012 1230   RBC 3.45* 09/23/2012 0525   RBC 3.87* 09/16/2012 1230   HGB 11.2* 09/23/2012 0525   HGB 12.7* 09/16/2012 1230   HCT 31.2* 09/23/2012 0525   HCT 36.3* 09/16/2012 1230   PLT 52* 09/23/2012 0525   PLT 92* 09/16/2012 1230   MCV 90.4 09/23/2012 0525   MCV 93.8 09/16/2012 1230   MCH 32.5 09/23/2012 0525   MCH 32.8 09/16/2012 1230   MCHC 35.9 09/23/2012 0525   MCHC 35.0 09/16/2012 1230   RDW 15.0 09/23/2012 0525   RDW 14.4 09/16/2012 1230   LYMPHSABS 0.3* 09/23/2012 0525   LYMPHSABS 0.6* 09/16/2012 1230   MONOABS 0.1 09/23/2012 0525   MONOABS 0.2 09/16/2012 1230   EOSABS 0.1 09/23/2012 0525   EOSABS 0.0 09/16/2012 1230   BASOSABS 0.0 09/23/2012 0525   BASOSABS 0.0 09/16/2012 1230    CXR:  Patchy bilateral infiltrates  BAL + for influenza A  IMPRESSION:  Pneumonia in immunocompromised host Resolving  neutropenia Clinically stable to improved BAL + for influenza A and negative for all other smears/stains  DISC: I think influenza would well explain his clinical and radiographic picture. The absence of findings on other BAL studies probably permits [place] to narrow antimicrobial coverage   PLAN: Discussed with Dr Ninetta Lights  Will defer all antimicrobial decisions to him From my perspective, he may be discharged any time as long as there is sufficient support @ home  Might require short term home O2 He should have CXR repeated in 2-4 wks to ensure resolution  Sooner if clinically deteriorates PCCM will sign off. Please call if we can be of further assistance   Billy Fischer, MD ; [facility] [phone].  After 5:30 PM or weekends, call [phone]

## 2012-09-24 DIAGNOSIS — D649 Anemia, unspecified: Secondary | ICD-10-CM

## 2012-09-24 LAB — URINALYSIS, ROUTINE W REFLEX MICROSCOPIC
Ketones, ur: NEGATIVE mg/dL
Leukocytes, UA: NEGATIVE
Nitrite: NEGATIVE
Protein, ur: NEGATIVE mg/dL
Urobilinogen, UA: 0.2 mg/dL (ref 0.0–1.0)

## 2012-09-24 LAB — CBC WITH DIFFERENTIAL/PLATELET
Basophils Absolute: 0.1 10*3/uL (ref 0.0–0.1)
Basophils Absolute: 0 10*3/uL (ref 0.0–0.1)
Eosinophils Absolute: 0.1 10*3/uL (ref 0.0–0.7)
Eosinophils Absolute: 0.1 10*3/uL (ref 0.0–0.7)
HCT: 21.8 % — ABNORMAL LOW (ref 39.0–52.0)
Lymphocytes Relative: 7 % — ABNORMAL LOW (ref 12–46)
Lymphocytes Relative: 6 % — ABNORMAL LOW (ref 12–46)
Lymphs Abs: 0.6 10*3/uL — ABNORMAL LOW (ref 0.7–4.0)
Monocytes Relative: 2 % — ABNORMAL LOW (ref 3–12)
Monocytes Relative: 3 % (ref 3–12)
Neutro Abs: 7.7 10*3/uL (ref 1.7–7.7)
Neutrophils Relative %: 90 % — ABNORMAL HIGH (ref 43–77)
Platelets: 51 10*3/uL — ABNORMAL LOW (ref 150–400)
Platelets: 54 10*3/uL — ABNORMAL LOW (ref 150–400)
RDW: 14.9 % (ref 11.5–15.5)
RDW: 14.9 % (ref 11.5–15.5)
WBC Morphology: INCREASED
WBC: 8.4 10*3/uL (ref 4.0–10.5)
WBC: 8.6 10*3/uL (ref 4.0–10.5)

## 2012-09-24 LAB — BASIC METABOLIC PANEL
Chloride: 92 mEq/L — ABNORMAL LOW (ref 96–112)
Creatinine, Ser: 0.46 mg/dL — ABNORMAL LOW (ref 0.50–1.35)
GFR calc Af Amer: >90 mL/min (ref >90)
GFR calc non Af Amer: >90 mL/min (ref >90)
Potassium: 2.6 mEq/L — CL (ref 3.5–5.1)

## 2012-09-24 LAB — CULTURE, BAL-QUANTITATIVE W GRAM STAIN: Gram Stain: NONE SEEN

## 2012-09-24 MED ORDER — POTASSIUM CHLORIDE IN NACL 40-0.9 MEQ/L-% IV SOLN
INTRAVENOUS | Status: DC
  Administered 2012-09-24 – 2012-09-25 (×2): via INTRAVENOUS
  Filled 2012-09-24 (×3): qty 1000

## 2012-09-24 NOTE — Progress Notes (Signed)
CRITICAL VALUE ALERT  Critical value received:  K of 2.6  Date of notification:  09/24/12  Time of notification:  1837  Critical value read back:yes  Nurse who received alert:  Sammuel Bailiff   MD notified (1st page):  Dr. Truett Perna  Time of first page:  1837  MD notified (2nd page):  Time of second page:  Responding MD:  Dr. Truett Perna   Time MD responded:  [phone]

## 2012-09-24 NOTE — Progress Notes (Signed)
IP PROGRESS NOTE  Subjective:   He reports stable exertional dyspnea. No fever spike. Anorexia persists.  Objective: Vital signs in last 24 hours: Blood pressure 161/74, pulse 86, temperature 97.8 F (36.6 C), temperature source Oral, resp. rate 22, height 6' (1.829 m), weight 200 lb (90.719 kg), SpO2 96.00%.  Intake/Output from previous day: 03/12 0701 - 03/13 0700 In: 1660 [P.O.:1660] Out: 4050 [Urine:4050]  Physical Exam: HEENT: No thrush Lungs: Inspiratory rhonchi /rales throughout the left and right posterior chest Cardiac: Regular rate and rhythm Extremities: No leg edema Abdomen: Nontender Right PICC without erythema  Lab Results:  Recent Labs  09/23/12 0525 09/24/12 1605  WBC 6.2 8.4  HGB 11.2* 7.6*  HCT 31.2* 21.0*  PLT 52* 51*    BMET  Recent Labs  09/22/12 0411 09/23/12 0525  NA 139 138  K 3.0* 2.9*  CL 103 106  CO2 22 22  GLUCOSE 98 98  BUN 8 6  CREATININE 0.74 0.56  CALCIUM 7.5* 7.2*    Studies/Results: No results found.  Medications: I have reviewed the patient's current medications.  Assessment/Plan:  1. Multiple myeloma-status post melphalan conditioning followed by autologous stem cell therapy in September of 2013, maintained on maintenance Revlimid prior to hospital admission 2. Pneumonia-admitted on 09/16/2012 and started on vancomycin/aztreonam. clindamycin and micafungin added by the infectious disease service. Influenza A.  was detected on the bronchoalveolar lavage sample from 09/21/2012 , now on Tamiflu 3. Neutropenia/thrombocytopenia-most likely related to Revlimid and the acute infection, the neutropenia  resolved after 2 days of G-CSF, G-CSF was resumed 09/21/2012 and the neutrophil count is higher today. The platelet count remains low. G-CSF will be discontinued today  4. Loose stools on hospital admission-C. difficile toxin assay negative 5. Hypokalemia secondary to diarrhea-the potassium continues to be low despite oral and  IV replacement. Normal magnesium level. We will followup on the potassium level today.  6. 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 Nedra Hai and I discussed this finding with Dr. Sung Amabile. This is unexpected finding. There is no clinical or radiologic finding to suggest a diagnosis of malignancy. He is a nonsmoker. It is possible this represents an atypical "squamous metaplasia "related to inflammation. We will obtain a repeat CT of the chest in approximately one month. Dr. Sung Amabile will perform a repeat bronchoscopy as indicated. 7. Anemia-unexplained drop in hemoglobin from yesterday. We will check a repeat CBC. No reported bleeding.  He appears unchanged today. Continue antibiotics per Dr. Ninetta Lights.     LOS: 8 days   Bryndan Bilyk, Jillyn Hidden  09/24/2012, 5:55 PM

## 2012-09-24 NOTE — Progress Notes (Signed)
Did walking O2 sats with pt. While ambulating and off of oxygen pt's sats ranged from 87%-90%. At rest he sats from 89%-92% on RA. On 2L  oxygen via [place] he ranges from 96%-98%. WIll continue to monitor.

## 2012-09-24 NOTE — Progress Notes (Signed)
NUTRITION FOLLOW UP  Intervention:   - Discussed strategies to help with dry mouth - Nutrition signing off, pt eating excellent and drinking nutritional supplements. Please re-consult if needed  Nutrition Dx:   Inadequate oral intake related to poor appetite, dry mouth, altered taste as evidenced by pt statement - resolved  Goal:   Pt to consume 100% of meals and supplements - met  Monitor:   Intake  Assessment:   Pt reports appetite is improving. Pt reports he has been drinking 100% the peach flavor of [employer] for additional nutrition. Pt reports he ate 100% of his breakfast.   Height: Ht Readings from Last 1 Encounters:  09/21/12 6' (1.829 m)    Weight Status:   Wt Readings from Last 1 Encounters:  09/21/12 200 lb (90.719 kg)    Re-estimated needs:  Kcal: 2200-2400  Protein: 100-120g  Fluid: 2.2-2.4L/day   Skin: Intact    Diet Order: General   Intake/Output Summary (Last 24 hours) at 09/24/12 1638 Last data filed at 09/24/12 1300  Gross per 24 hour  Intake   1420 ml  Output   3450 ml  Net  -2030 ml    Last BM: 3/12   Labs:   Recent Labs Lab 09/18/12 0345  09/21/12 1545 09/22/12 0411 09/23/12 0525  NA 132*  < > 134* 139 138  K 3.0*  < > 2.9* 3.0* 2.9*  CL 100  < > 99 103 106  CO2 22  < > 22 22 22   BUN 7  < > 8 8 6   CREATININE 1.00  < > 0.68 0.74 0.56  CALCIUM 7.3*  < > 7.4* 7.5* 7.2*  MG 1.7  --   --   --  2.0  GLUCOSE 91  < > 101* 98 98  < > = values in this interval not displayed.  CBG (last 3)  No results found for this basename: GLUCAP,  in the last 72 hours  Scheduled Meds: . aztreonam  2 g Intravenous Q8H  . feeding supplement  1 Container Oral TID BM  . filgrastim  480 mcg Subcutaneous q1800  . oseltamivir  75 mg Oral BID  . potassium chloride  20 mEq Oral TID  . valACYclovir  500 mg Oral Daily  . vancomycin  1,750 mg Intravenous Q8H    Continuous Infusions: . 0.9 % sodium chloride with kcl 75 mL/hr at 09/24/12 [address], [address], [place] [phone] Pager [phone] After Hours Pager

## 2012-09-24 NOTE — Progress Notes (Signed)
INFECTIOUS DISEASE PROGRESS NOTE  ID: Chad [name] is a 57 y.o. male with   Active Problems:   Influenza A  Subjective: Feels better, has ambulated without O2  Abtx:  Anti-infectives   Start     Dose/Rate Route Frequency Ordered Stop   09/23/12 1230  oseltamivir (TAMIFLU) capsule 75 mg     75 mg Oral 2 times daily 09/23/12 1140 09/28/12 0959   09/22/12 1430  vancomycin (VANCOCIN) 1,750 mg in sodium chloride 0.9 % 500 mL IVPB     1,750 mg 250 mL/hr over 120 Minutes Intravenous 3 times per day 09/22/12 1408     09/21/12 2100  vancomycin (VANCOCIN) 1,500 mg in sodium chloride 0.9 % 500 mL IVPB  Status:  Discontinued     1,500 mg 250 mL/hr over 120 Minutes Intravenous Every 8 hours 09/21/12 2015 09/22/12 1408   09/20/12 1400  clindamycin (CLEOCIN) IVPB 600 mg  Status:  Discontinued     600 mg 100 mL/hr over 30 Minutes Intravenous 3 times per day 09/20/12 1349 09/23/12 1140   09/19/12 1530  micafungin (MYCAMINE) 100 mg in sodium chloride 0.9 % 100 mL IVPB  Status:  Discontinued     100 mg 100 mL/hr over 1 Hours Intravenous Daily 09/19/12 1500 09/23/12 1140   09/19/12 1400  vancomycin (VANCOCIN) 1,500 mg in sodium chloride 0.9 % 500 mL IVPB  Status:  Discontinued     1,500 mg 250 mL/hr over 120 Minutes Intravenous Every 8 hours 09/19/12 0736 09/21/12 2015   09/18/12 0600  vancomycin (VANCOCIN) 1,250 mg in sodium chloride 0.9 % 250 mL IVPB  Status:  Discontinued     1,250 mg 166.7 mL/hr over 90 Minutes Intravenous Every 8 hours 09/18/12 0034 09/19/12 0736   09/17/12 1000  valACYclovir (VALTREX) tablet 500 mg     500 mg Oral Daily 09/16/12 1636     09/16/12 2359  vancomycin (VANCOCIN) IVPB 1000 mg/200 mL premix  Status:  Discontinued     1,000 mg 200 mL/hr over 60 Minutes Intravenous Every 8 hours 09/16/12 1750 09/18/12 0034   09/16/12 2359  aztreonam (AZACTAM) 2 g in dextrose 5 % 50 mL IVPB     2 g 100 mL/hr over 30 Minutes Intravenous Every 8 hours 09/16/12 1750     09/16/12  1630  vancomycin (VANCOCIN) 1,500 mg in sodium chloride 0.9 % 500 mL IVPB     1,500 mg 250 mL/hr over 120 Minutes Intravenous  Once 09/16/12 1626 09/16/12 2005   09/16/12 1630  aztreonam (AZACTAM) 2 g in dextrose 5 % 50 mL IVPB     2 g 100 mL/hr over 30 Minutes Intravenous  Once 09/16/12 1626 09/16/12 1800      Medications:  Scheduled: . aztreonam  2 g Intravenous Q8H  . feeding supplement  1 Container Oral TID BM  . filgrastim  480 mcg Subcutaneous q1800  . oseltamivir  75 mg Oral BID  . potassium chloride  20 mEq Oral TID  . valACYclovir  500 mg Oral Daily  . vancomycin  1,750 mg Intravenous Q8H    Objective: Vital signs in last 24 hours: Temp:  [98.3 F (36.8 C)-99.7 F (37.6 C)] 98.3 F (36.8 C) (03/13 0530) Pulse Rate:  [86-88] 88 (03/13 0530) Resp:  [21-28] 28 (03/13 0530) BP: (144-156)/(70-74) 156/73 mmHg (03/13 0530) SpO2:  [97 %] 97 % (03/13 0530)   General appearance: alert, cooperative and no distress Resp: rhonchi anterior - bilateral Cardio: regular rate and rhythm  GI: normal findings: bowel sounds normal and soft, non-tender Extremities: edema none  Lab Results  Recent Labs  09/22/12 0411 09/23/12 0525  WBC 3.6* 6.2  HGB 9.6* 11.2*  HCT 27.1* 31.2*  NA 139 138  K 3.0* 2.9*  CL 103 106  CO2 22 22  BUN 8 6  CREATININE 0.74 0.56   Liver Panel  Recent Labs  09/21/12 1545 09/22/12 0411  PROT 6.3 6.5  ALBUMIN 2.4* 2.3*  AST 59* 67*  ALT 36 46  ALKPHOS 46 55  BILITOT 0.5 0.6   Sedimentation Rate No results found for this basename: ESRSEDRATE,  in the last 72 hours C-Reactive Protein No results found for this basename: CRP,  in the last 72 hours  Microbiology:   Studies/Results: Dg Chest 2 View  09/22/2012  *RADIOLOGY REPORT*  Clinical Data: Cough, evaluate infiltrates, history coronary disease, multiple myeloma  CHEST - 2 VIEW  Comparison: 09/11/2012 Correlation:  CT chest 09/20/2012  Findings: Right arm PICC line tip projecting over  SVC. Upper-normal size of cardiac silhouette. Mediastinal contours and pulmonary vascularity normal. Patchy infiltrates perihilar and bilateral lung bases not significantly changed since interval CT. Right upper lobe infiltrate has perhaps slightly increased since the CT exam. No gross pleural effusion or pneumothorax. No acute osseous findings.  IMPRESSION: Patchy bilateral airspace infiltrates, question slightly increased at right apex.   Original Report Authenticated By: Chad [name], M.D.      Assessment/Plan: Myeloma  Influenza/Pneumonia (worsening on CT 3-9 vs previous)  Loose BM  Total days of antibiotics: 9 (vanco/aztreonam), day 2 tamiflu  He is improving He has ambulated today without O2 Will check walking sats on him to help stage for home O2 Recheck CXR in AM, hopefully change to PO anbx Await BAL: CMV DNA and EBV DNA (both serum -)  My great appreciation to pulmonary  Explained to pt, family   Chad [name] Infectious Diseases [phone] 09/24/2012, 12:29 PM   LOS: 8 days

## 2012-09-25 ENCOUNTER — Inpatient Hospital Stay (HOSPITAL_COMMUNITY): Payer: BC Managed Care – PPO

## 2012-09-25 LAB — CBC WITH DIFFERENTIAL/PLATELET
Basophils Absolute: 0.1 10*3/uL (ref 0.0–0.1)
Eosinophils Relative: 1 % (ref 0–5)
HCT: 23.5 % — ABNORMAL LOW (ref 39.0–52.0)
Lymphs Abs: 0.6 10*3/uL — ABNORMAL LOW (ref 0.7–4.0)
MCH: 32 pg (ref 26.0–34.0)
MCV: 91.8 fL (ref 78.0–100.0)
Monocytes Absolute: 0.2 10*3/uL (ref 0.1–1.0)
Monocytes Relative: 3 % (ref 3–12)
Neutro Abs: 5.3 10*3/uL (ref 1.7–7.7)
Platelets: 52 10*3/uL — ABNORMAL LOW (ref 150–400)
RDW: 14.8 % (ref 11.5–15.5)
WBC: 6.3 10*3/uL (ref 4.0–10.5)

## 2012-09-25 LAB — BASIC METABOLIC PANEL
CO2: 24 mEq/L (ref 19–32)
Calcium: 7.5 mg/dL — ABNORMAL LOW (ref 8.4–10.5)
Chloride: 99 mEq/L (ref 96–112)
Creatinine, Ser: 0.51 mg/dL (ref 0.50–1.35)
Glucose, Bld: 95 mg/dL (ref 70–99)

## 2012-09-25 LAB — NA AND K (SODIUM & POTASSIUM), RAND UR: Sodium, Ur: 128 mEq/L

## 2012-09-25 MED ORDER — DOXYCYCLINE HYCLATE 100 MG PO TABS
100.0000 mg | ORAL_TABLET | Freq: Two times a day (BID) | ORAL | Status: DC
  Administered 2012-09-25 – 2012-09-27 (×5): 100 mg via ORAL
  Filled 2012-09-25 (×6): qty 1

## 2012-09-25 MED ORDER — SODIUM CHLORIDE 0.9 % IV SOLN
INTRAVENOUS | Status: DC
  Administered 2012-09-25 (×2): via INTRAVENOUS
  Filled 2012-09-25 (×2): qty 1000

## 2012-09-25 MED ORDER — SPIRONOLACTONE 25 MG PO TABS
25.0000 mg | ORAL_TABLET | Freq: Two times a day (BID) | ORAL | Status: DC
  Administered 2012-09-25: 25 mg via ORAL
  Filled 2012-09-25 (×2): qty 1

## 2012-09-25 MED ORDER — POTASSIUM CHLORIDE CRYS ER 20 MEQ PO TBCR
40.0000 meq | EXTENDED_RELEASE_TABLET | Freq: Three times a day (TID) | ORAL | Status: DC
  Administered 2012-09-25: 40 meq via ORAL
  Filled 2012-09-25 (×4): qty 2

## 2012-09-25 NOTE — Progress Notes (Signed)
IP PROGRESS NOTE  Subjective:   No fever. He was able to ambulate in the [place].  Objective: Vital signs in last 24 hours: Blood pressure 150/78, pulse 81, temperature 97.8 F (36.6 C), temperature source Oral, resp. rate 20, height 6' (1.829 m), weight 200 lb (90.719 kg), SpO2 95.00%.  Intake/Output from previous day: 03/13 0701 - 03/14 0700 In: 480 [P.O.:480] Out: 4100 [Urine:4100]  Physical Exam: HEENT: No thrush Lungs: Inspiratory rhonchi /rales throughout the left and right posterior chest Cardiac: Regular rate and rhythm Extremities: No leg edema Abdomen: Nontender Right PICC without erythema  Lab Results:  Recent Labs  09/24/12 1845 09/25/12 0545  WBC 8.6 6.3  HGB 7.7* 8.2*  HCT 21.8* 23.5*  PLT 54* 52*   ANC 5.7  BMET  Recent Labs  09/24/12 1605 09/25/12 0545  NA 126* 134*  K 2.6* 3.1*  CL 92* 99  CO2 20 24  GLUCOSE 396* 95  BUN 6 7  CREATININE 0.46* 0.51  CALCIUM 6.8* 7.5*    Studies/Results: Dg Chest 2 View  09/25/2012  *RADIOLOGY REPORT*  Clinical Data: Pneumonia.  CHEST - 2 VIEW  Comparison: September 22, 2012.  Findings: Cardiomediastinal silhouette appears normal.  Patchy alveolar opacities are again noted throughout both lungs which are not significantly changed and most consistent with pneumonia. Right-sided PICC line is unchanged.  No pneumothorax or pleural effusions are noted.  Bony thorax is intact.  IMPRESSION: No change noted in bilateral patchy alveolar opacities concerning for pneumonia.   Original Report Authenticated By: Lupita Raider.,  M.D.     Medications: I have reviewed the patient's current medications.  Assessment/Plan:  1. Multiple myeloma-status post melphalan conditioning followed by autologous stem cell therapy in September of 2013, maintained on maintenance Revlimid prior to hospital admission. The Revlimid is on hold. 2. Pneumonia-admitted on 09/16/2012 and started on vancomycin/aztreonam. clindamycin and micafungin added  by the infectious disease service. Influenza A. was detected on the bronchoalveolar lavage sample from 09/21/2012 . Now on doxycycline and Tamiflu 3. Neutropenia/thrombocytopenia-most likely related to Revlimid and the acute infection, the neutropenia  resolved after 2 days of G-CSF, G-CSF was resumed 09/21/2012 and the neutrophil count is higher.. The platelet count remains low. 4. Loose stools on hospital admission-C. difficile toxin assay negative 5. Hypokalemia-etiology unclear, the potassium remains low despite oral and IV replacement. He has a high urine output. I discussed the case with the nephrology service. He may have renal injury from systemic chemotherapy and myeloma. He we will begin a trial of spironolactone.    The fever has resolved and his performance status appears improved. The plan is to begin a trial of spironolactone for treatment of the hypokalemia. He is now on oral antibiotics. We will check his oxygen saturation with ambulation. I anticipate he will be ready for discharge within the next one to 2 days.   LOS: 9 days   [name], [name]  09/25/2012, 1:19 PM

## 2012-09-25 NOTE — Progress Notes (Signed)
CRITICAL VALUE ALERT  Critical value received:  EBV DNA QL PCR  Date of notification:  09/25/12  Time of notification:  1640   Critical value read back: yes  Nurse who received alert: Farley Ly   MD notified (1st page):  Dr. Truett Perna  Time of first page:  1657  MD notified (2nd page):  Time of second page:  Responding MD: Truett Perna  Time MD responded:  [phone]

## 2012-09-25 NOTE — Progress Notes (Signed)
INFECTIOUS DISEASE PROGRESS NOTE  ID: Chad [name] is a 57 y.o. male with   Active Problems:   Influenza A  Subjective: "I feel better everyday" "I haven't been able to sleep more than an hour at a time"  Abtx:  Anti-infectives   Start     Dose/Rate Route Frequency Ordered Stop   09/23/12 1230  oseltamivir (TAMIFLU) capsule 75 mg     75 mg Oral 2 times daily 09/23/12 1140 09/28/12 0959   09/22/12 1430  vancomycin (VANCOCIN) 1,750 mg in sodium chloride 0.9 % 500 mL IVPB     1,750 mg 250 mL/hr over 120 Minutes Intravenous 3 times per day 09/22/12 1408     09/21/12 2100  vancomycin (VANCOCIN) 1,500 mg in sodium chloride 0.9 % 500 mL IVPB  Status:  Discontinued     1,500 mg 250 mL/hr over 120 Minutes Intravenous Every 8 hours 09/21/12 2015 09/22/12 1408   09/20/12 1400  clindamycin (CLEOCIN) IVPB 600 mg  Status:  Discontinued     600 mg 100 mL/hr over 30 Minutes Intravenous 3 times per day 09/20/12 1349 09/23/12 1140   09/19/12 1530  micafungin (MYCAMINE) 100 mg in sodium chloride 0.9 % 100 mL IVPB  Status:  Discontinued     100 mg 100 mL/hr over 1 Hours Intravenous Daily 09/19/12 1500 09/23/12 1140   09/19/12 1400  vancomycin (VANCOCIN) 1,500 mg in sodium chloride 0.9 % 500 mL IVPB  Status:  Discontinued     1,500 mg 250 mL/hr over 120 Minutes Intravenous Every 8 hours 09/19/12 0736 09/21/12 2015   09/18/12 0600  vancomycin (VANCOCIN) 1,250 mg in sodium chloride 0.9 % 250 mL IVPB  Status:  Discontinued     1,250 mg 166.7 mL/hr over 90 Minutes Intravenous Every 8 hours 09/18/12 0034 09/19/12 0736   09/17/12 1000  valACYclovir (VALTREX) tablet 500 mg     500 mg Oral Daily 09/16/12 1636     09/16/12 2359  vancomycin (VANCOCIN) IVPB 1000 mg/200 mL premix  Status:  Discontinued     1,000 mg 200 mL/hr over 60 Minutes Intravenous Every 8 hours 09/16/12 1750 09/18/12 0034   09/16/12 2359  aztreonam (AZACTAM) 2 g in dextrose 5 % 50 mL IVPB     2 g 100 mL/hr over 30 Minutes Intravenous  Every 8 hours 09/16/12 1750     09/16/12 1630  vancomycin (VANCOCIN) 1,500 mg in sodium chloride 0.9 % 500 mL IVPB     1,500 mg 250 mL/hr over 120 Minutes Intravenous  Once 09/16/12 1626 09/16/12 2005   09/16/12 1630  aztreonam (AZACTAM) 2 g in dextrose 5 % 50 mL IVPB     2 g 100 mL/hr over 30 Minutes Intravenous  Once 09/16/12 1626 09/16/12 1800      Medications:  Scheduled: . aztreonam  2 g Intravenous Q8H  . feeding supplement  1 Container Oral TID BM  . oseltamivir  75 mg Oral BID  . potassium chloride  20 mEq Oral TID  . valACYclovir  500 mg Oral Daily  . vancomycin  1,750 mg Intravenous Q8H    Objective: Vital signs in last 24 hours: Temp:  [97.8 F (36.6 C)-98.5 F (36.9 C)] 97.8 F (36.6 C) (03/14 0630) Pulse Rate:  [81-86] 81 (03/14 0630) Resp:  [18-22] 20 (03/14 0630) BP: (150-166)/(74-81) 150/78 mmHg (03/14 0630) SpO2:  [95 %-97 %] 95 % (03/14 0630)   General appearance: alert, cooperative and no distress Resp: rhonchi bilaterally Cardio: regular rate  and rhythm GI: normal findings: bowel sounds normal and soft, non-tender Extremities: edema none Mild rhonchi, good air movement  Lab Results  Recent Labs  09/24/12 1605 09/24/12 1845 09/25/12 0545  WBC 8.4 8.6 6.3  HGB 7.6* 7.7* 8.2*  HCT 21.0* 21.8* 23.5*  NA 126*  --  134*  K 2.6*  --  3.1*  CL 92*  --  99  CO2 20  --  24  BUN 6  --  7  CREATININE 0.46*  --  0.51   Liver Panel No results found for this basename: PROT, ALBUMIN, AST, ALT, ALKPHOS, BILITOT, BILIDIR, IBILI,  in the last 72 hours Sedimentation Rate No results found for this basename: ESRSEDRATE,  in the last 72 hours C-Reactive Protein No results found for this basename: CRP,  in the last 72 hours  Microbiology:   Studies/Results: Dg Chest 2 View  09/25/2012  *RADIOLOGY REPORT*  Clinical Data: Pneumonia.  CHEST - 2 VIEW  Comparison: September 22, 2012.  Findings: Cardiomediastinal silhouette appears normal.  Patchy alveolar  opacities are again noted throughout both lungs which are not significantly changed and most consistent with pneumonia. Right-sided PICC line is unchanged.  No pneumothorax or pleural effusions are noted.  Bony thorax is intact.  IMPRESSION: No change noted in bilateral patchy alveolar opacities concerning for pneumonia.   Original Report Authenticated By: Lupita Raider.,  M.D.      Assessment/Plan: Myeloma  Influenza/Pneumonia (worsening on CT 3-9 vs previous)  Loose BM  Total days of antibiotics: 10 (vanco/aztreonam), day 3 tamiflu  Plan for 5 days of tamiflu Change vanco/aztreonam to doxy to complete 5 more days Can d/c isolation, afebrile more than 24 h Let him and his wife know that his cxr changes may take up to a month to normalize Available if questions   [name] Infectious Diseases [phone] 09/25/2012, 11:23 AM   LOS: 9 days

## 2012-09-26 LAB — BASIC METABOLIC PANEL
BUN: 11 mg/dL (ref 6–23)
BUN: 13 mg/dL (ref 6–23)
CO2: 24 mEq/L (ref 19–32)
CO2: 23 mEq/L (ref 19–32)
CO2: 24 mEq/L (ref 19–32)
Calcium: 8.2 mg/dL — ABNORMAL LOW (ref 8.4–10.5)
Chloride: 99 mEq/L (ref 96–112)
Chloride: 101 mEq/L (ref 96–112)
Creatinine, Ser: 0.53 mg/dL (ref 0.50–1.35)
Creatinine, Ser: 0.65 mg/dL (ref 0.50–1.35)
GFR calc non Af Amer: >90 mL/min (ref >90)
Glucose, Bld: 90 mg/dL (ref 70–99)
Glucose, Bld: 161 mg/dL — ABNORMAL HIGH (ref 70–99)
Glucose, Bld: 125 mg/dL — ABNORMAL HIGH (ref 70–99)
Potassium: 3.1 mEq/L — ABNORMAL LOW (ref 3.5–5.1)

## 2012-09-26 LAB — CBC WITH DIFFERENTIAL/PLATELET
Basophils Absolute: 0.1 10*3/uL (ref 0.0–0.1)
Basophils Relative: 2 % — ABNORMAL HIGH (ref 0–1)
Eosinophils Absolute: 0.1 10*3/uL (ref 0.0–0.7)
HCT: 25 % — ABNORMAL LOW (ref 39.0–52.0)
Hemoglobin: 9 g/dL — ABNORMAL LOW (ref 13.0–17.0)
MCH: 32.8 pg (ref 26.0–34.0)
MCHC: 36 g/dL (ref 30.0–36.0)
Monocytes Absolute: 0.3 10*3/uL (ref 0.1–1.0)
Neutro Abs: 2.2 10*3/uL (ref 1.7–7.7)
Neutrophils Relative %: 64 % (ref 43–77)
RDW: 15.1 % (ref 11.5–15.5)

## 2012-09-26 LAB — PHOSPHORUS: Phosphorus: 2.1 mg/dL — ABNORMAL LOW (ref 2.3–4.6)

## 2012-09-26 MED ORDER — SALINE SPRAY 0.65 % NA SOLN
1.0000 | NASAL | Status: DC | PRN
  Filled 2012-09-26: qty 44

## 2012-09-26 MED ORDER — POTASSIUM CHLORIDE CRYS ER 20 MEQ PO TBCR
40.0000 meq | EXTENDED_RELEASE_TABLET | Freq: Four times a day (QID) | ORAL | Status: DC
  Administered 2012-09-26 – 2012-09-27 (×7): 40 meq via ORAL
  Filled 2012-09-26 (×8): qty 2

## 2012-09-26 NOTE — Consult Note (Signed)
Chad [name] 09/26/2012 Aliea Bobe D Requesting Physician:  Dr Truett Perna  Reason for Consult:  Hypokalemia HPI: The patient is a 57 y.o. year-old with hx of HTN, CAD and a 10 year hx of multiple myeloma (IgG kappa type) for which he has undergone autologous stemcell transplant x 2 in 2004 and in 2013 at [facility]. The procedure uses high dose melphalan conditioning. Myeloma recurred in 2009 and he was treated with Revlimid/Decadron for 12 mos with good response.  In March 2013 testing showed recurrence again and he rec'd Revlimid starting in March, in addition Velcade/decadron starting in May and rec'd these agents up until the second transplant was done in Sept 2013.  Currently he is on Revlimid alone as part of the post-transplant protocol.  Patient was admitted on 09/16/12 with fevers, cough and negative CXR.He he has been treated empirically with the following anti-infectious agents > vanc IV 3/5- 3/14 > valtrex 500/d 3/5- current > Tamiflu 3/12- current > Micafungin 3/8 - 3/12 > Doxycycline po 3/14 - current > Clindamycin 3/9 - 3/12 > Aztreonam 3/5- 3/14 Fevers finally resolved around 3/11. Flu test was positive and pt started on Tamiflu on 3/12.   Currently patient is without complaints.   K+levels have been consistently low, at 3.1 on admission, lowest 2.9,highest 3.1 since admit.  Serum CO2 is 22-24, anion gap is 10-12.  Urine pH on 3/5was 5.5and on 3/13 was 7.0   ROS  +hx of low K a few years ago where he took KClpills at home for about a month of two  no abd pain, n/v/d  no joint pain  no skin rash  no confusion  no hallucination  no sob or CP  no HA's   Past Medical History:  Past Medical History  Diagnosis Date  . Unspecified essential hypertension   . Coronary atherosclerosis of unspecified type of vessel, native or graft   . Pure hypercholesterolemia   . Overweight   . Benign neoplasm of colon   . Multiple myeloma, without mention of having achieved remission(203.00)  2004  . Urticaria, unspecified   . 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 Cancer 10/26/11 ct    multiple myelomatous lesion proximal right humerus,scaspula and ribs,  . History of chemotherapy 05/2003    high dose chemotherapy with autologus stem cell support/revlimid started 09/27/11  . Pneumonia 12/2007    hx  12/2007 & agian 01/06/09  . Allergy   . URI (upper respiratory infection) 08/2009    viral URI  . Neuropathy     hx intermittement tingling/numbness in feet r/t? revlimid, , resolved  . Neuropathy 10/22/11    intermittent tingling feet  again with revlimid therapy  . H/O cardiac catheterization 07/13/02  . H/O colonoscopy 04/2007    6mm desc colon polyp =adenomatous  . H/O stem cell transplant Nov. 2004    [facility] hospital    Past Surgical History:  Past Surgical History  Procedure Laterality Date  . 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 Video bronchoscopy Bilateral 09/21/2012    Procedure: VIDEO BRONCHOSCOPY WITHOUT FLUORO;  Surgeon: Merwyn Katos, MD;  Location: [facility];  Service: Cardiopulmonary;  Laterality: Bilateral;    Family History:  Family History  Problem Relation Age of Onset  . Cancer Father 51    lymphoma   Social History:  reports that he has never smoked. He has never used smokeless tobacco. He reports that  drinks  alcohol. He reports that he does not use illicit drugs.  Allergies:  Allergies  Allergen Reactions  . Primaxin (Imipenem) Rash  . Cephalosporins   . Lisinopril     REACTION: ALLERGIC to ACE w/ cough...  . Moxifloxacin     Swollen lips  . Penicillins     rash  . Valsartan     REACTION: ALLERGIC to ARB w/ Urticaria \\T \ Angioedema    Home medications: Prior to Admission medications   Medication Sig Start Date End Date Taking? Authorizing Provider  acetaminophen (TYLENOL) 325 MG tablet Take 650 mg by mouth every 6 (six) hours as needed.   Yes Historical Provider, MD  aspirin 81 MG  tablet Take 81 mg by mouth daily.     Yes Historical Provider, MD  Calcium 600-200 MG-UNIT per tablet Take 1 tablet by mouth 2 (two) times daily. 06/22/12  Yes Ladene Artist, MD  guaiFENesin (MUCINEX) 600 MG 12 hr tablet Take 1,200 mg by mouth as needed.    Yes Historical Provider, MD  lenalidomide (REVLIMID) 10 MG capsule Take 1 capsule (10 mg total) by mouth daily. 08/31/12  Yes Ladene Artist, MD  loperamide (IMODIUM) 2 MG capsule Take 2 mg by mouth 4 (four) times daily as needed for diarrhea or loose stools.   Yes Historical Provider, MD  prochlorperazine (COMPAZINE) 10 MG tablet Take 1 tablet (10 mg total) by mouth every 6 (six) hours as needed. 11/29/11  Yes Ladene Artist, MD  valACYclovir (VALTREX) 500 MG tablet Take 1 tablet (500 mg total) by mouth daily. 05/04/12  Yes Ladene Artist, MD  Zoledronic Acid (ZOMETA IV) Inject 4 mg into the vein every 3 (three) months.     Historical Provider, MD    Labs: Basic Metabolic Panel:  Recent Labs Lab 09/21/12 1545 09/22/12 0411 09/23/12 0525 09/24/12 1605 09/25/12 0545 09/26/12 0510 09/26/12 1000  NA 134* 139 138 126* 134* 136 135  K 2.9* 3.0* 2.9* 2.6* 3.1* 3.5 3.1*  CL 99 103 106 92* 99 102 99  CO2 22 22 22 20 24 24 23   GLUCOSE 101* 98 98 396* 95 90 161*  BUN 8 8 6 6 7 9 11   CREATININE 0.68 0.74 0.56 0.46* 0.51 0.53 0.59  CALCIUM 7.4* 7.5* 7.2* 6.8* 7.5* 8.2* 8.5  PHOS  --   --   --   --   --   --  2.1*   Liver Function Tests:  Recent Labs Lab 09/21/12 1545 09/22/12 0411  AST 59* 67*  ALT 36 46  ALKPHOS 46 55  BILITOT 0.5 0.6  PROT 6.3 6.5  ALBUMIN 2.4* 2.3*    Recent Labs Lab 09/19/12 1559  LIPASE 61*  AMYLASE 62   No results found for this basename: AMMONIA,  in the last 168 hours CBC:  Recent Labs Lab 09/24/12 1605 09/24/12 1845 09/25/12 0545 09/26/12 0510  WBC 8.4 8.6 6.3 3.5*  NEUTROABS 7.4 7.7 5.3 2.2  HGB 7.6* 7.7* 8.2* 9.0*  HCT 21.0* 21.8* 23.5* 25.0*  MCV 91.3 91.6 91.8 91.2  PLT 51* 54*  52* 47*    Physical Exam:  Blood pressure 151/84, pulse 91, temperature 98.8 F (37.1 C), temperature source Oral, resp. rate 20, height 6' (1.829 m), weight 90.719 kg (200 lb), SpO2 95.00%.  Gen: alert, no distress Skin: no rash, cyanosis HEENT:  EOMI, sclera anicteric, throat clear and moist Neck: no JVD, no LAN Chest: clear bilat CV: regular, no rub or gallop, no murmur,  no carotid or femoral bruits, pedal pulses intact Abdomen: soft, nontender, no ascites or HSM Ext: no LE edema , no joint effusion or deformity, no gangrene or ulceration Neuro: alert, Ox3, no focal deficit   Impression/Plan 1. Hypokalemia- in patient w hx of MM and second autologous stem cell transplant at [facility] done Sept 2013. K+levels remained low despite replacement. No diarrhea or vomiting.Was admitted for PNA and rec'd several abx then flu test came back +and is now getting Tamiflu and po doxycycline.  Ordered testing to determine is this is renal K+ wasting or not. There is no sign of metabolic acidosis to suggest RTA. Serum Mg was wnl also. For now continue KCl 40 qid, and hold spironolactone until urine studies can be done. Stop fluids, pt is eating well enough. 2. Hx multiple myeloma, s/p SCT x 2 3. PNA/flu- better   Vinson Moselle  MD [facility] [phone] pgr    [phone] cell 09/26/2012, 2:33 PM

## 2012-09-26 NOTE — Progress Notes (Signed)
Subjective: He is Doing fine today with no complaints. Afebrile. No Nausea or vomiting. No chest pain or SOB. Able to ambulate and walked in the hallway 3 times yesterday.  Objective: Vital signs in last 24 hours: Temp:  [98.1 F (36.7 C)-98.8 F (37.1 C)] 98.8 F (37.1 C) (03/15 0508) Pulse Rate:  [81-91] 91 (03/15 0508) Resp:  [18-20] 20 (03/15 0508) BP: (146-156)/(79-84) 151/84 mmHg (03/15 0508) SpO2:  [95 %-96 %] 95 % (03/15 0508)  Intake/Output from previous day: 03/14 0701 - 03/15 0700 In: 559 [P.O.:480; I.V.:79] Out: 3550 [Urine:3550] Intake/Output this shift:    General appearance: alert, cooperative and no distress Resp: clear to auscultation bilaterally Cardio: regular rate and rhythm, S1, S2 normal, no murmur, click, rub or gallop GI: soft, non-tender; bowel sounds normal; no masses,  no organomegaly Extremities: extremities normal, atraumatic, no cyanosis or edema  Lab Results:   Recent Labs  09/25/12 0545 09/26/12 0510  WBC 6.3 3.5*  HGB 8.2* 9.0*  HCT 23.5* 25.0*  PLT 52* 47*   BMET  Recent Labs  09/25/12 0545 09/26/12 0510  NA 134* 136  K 3.1* 3.5  CL 99 102  CO2 24 24  GLUCOSE 95 90  BUN 7 9  CREATININE 0.51 0.53  CALCIUM 7.5* 8.2*    Studies/Results: Dg Chest 2 View  09/25/2012  *RADIOLOGY REPORT*  Clinical Data: Pneumonia.  CHEST - 2 VIEW  Comparison: September 22, 2012.  Findings: Cardiomediastinal silhouette appears normal.  Patchy alveolar opacities are again noted throughout both lungs which are not significantly changed and most consistent with pneumonia. Right-sided PICC line is unchanged.  No pneumothorax or pleural effusions are noted.  Bony thorax is intact.  IMPRESSION: No change noted in bilateral patchy alveolar opacities concerning for pneumonia.   Original Report Authenticated By: Lupita Raider.,  M.D.     Medications: I have reviewed the patient's current medications.  Assessment/Plan: He is doing OK today. No change in the  current care plan. Disposition tomorrow if continues to do well today. 1. Multiple myeloma-status post melphalan conditioning followed by autologous stem cell therapy in September of 2013, maintained on maintenance Revlimid prior to hospital admission. The Revlimid is on hold.  2. Pneumonia-admitted on 09/16/2012 and started on vancomycin/aztreonam. clindamycin and micafungin added by the infectious disease service. Influenza A. was detected on the bronchoalveolar lavage sample from 09/21/2012 . Now on doxycycline and Tamiflu  3. Neutropenia/thrombocytopenia-most likely related to Revlimid and the acute infection, the neutropenia resolved after 2 days of G-CSF, G-CSF was resumed 09/21/2012 and the neutrophil count is higher.. The platelet count remains low.  4. Loose stools on hospital admission-C. difficile toxin assay negative  5. Hypokalemia-etiology unclear, the potassium remains low despite oral and IV replacement. He has a high urine output. I discussed the case with the nephrology service. He may have renal injury from systemic chemotherapy and myeloma. He we will begin a trial of spironolactone   LOS: 10 days    Chad [name] K. 09/26/2012

## 2012-09-27 ENCOUNTER — Telehealth: Payer: Self-pay | Admitting: Oncology

## 2012-09-27 LAB — CBC WITH DIFFERENTIAL/PLATELET
Basophils Relative: 3 % — ABNORMAL HIGH (ref 0–1)
Eosinophils Absolute: 0.1 10*3/uL (ref 0.0–0.7)
HCT: 28.5 % — ABNORMAL LOW (ref 39.0–52.0)
Hemoglobin: 10 g/dL — ABNORMAL LOW (ref 13.0–17.0)
Lymphocytes Relative: 30 % (ref 12–46)
MCHC: 35.1 g/dL (ref 30.0–36.0)
Monocytes Relative: 7 % (ref 3–12)
Neutro Abs: 2 10*3/uL (ref 1.7–7.7)

## 2012-09-27 LAB — BASIC METABOLIC PANEL
BUN: 14 mg/dL (ref 6–23)
CO2: 22 mEq/L (ref 19–32)
Chloride: 103 mEq/L (ref 96–112)
GFR calc Af Amer: >90 mL/min (ref >90)
Glucose, Bld: 106 mg/dL — ABNORMAL HIGH (ref 70–99)
Potassium: 3.9 mEq/L (ref 3.5–5.1)

## 2012-09-27 LAB — LEGIONELLA CULTURE

## 2012-09-27 MED ORDER — SALINE SPRAY 0.65 % NA SOLN: 1.0000 | NASAL | Status: DC | PRN

## 2012-09-27 MED ORDER — POTASSIUM CHLORIDE CRYS ER 20 MEQ PO TBCR: 40.0000 meq | EXTENDED_RELEASE_TABLET | Freq: Four times a day (QID) | ORAL | Status: DC

## 2012-09-27 MED ORDER — OSELTAMIVIR PHOSPHATE 75 MG PO CAPS: 75.0000 mg | ORAL_CAPSULE | Freq: Two times a day (BID) | ORAL | Status: AC

## 2012-09-27 MED ORDER — DOXYCYCLINE HYCLATE 100 MG PO TABS: 100.0000 mg | ORAL_TABLET | Freq: Two times a day (BID) | ORAL | Status: DC

## 2012-09-27 NOTE — Progress Notes (Addendum)
Pt anxious to be discharged home. Wife at bedside. He is ambulating in the hall with wife, tolerates well.Non-productive cough. Red line of picc occluded, waiting to see if pt. Will be d/c'd today. Dr. Arlean Hopping in to see pt. Discharge instructions explained to pt and wife. Prescriptions given to pt. Evening does of Tamiflu and doxycycline given to pt to take tonight. Pt Also instructed not to take any KCL tonite. He will start taking just 2 doses a day starting 3/17, 40 meq each dose x 5 days.

## 2012-09-27 NOTE — Progress Notes (Signed)
Medical Oncology  [facility] apparently only sends his scripts to mail order pharmacy, so I have also called in the scripts for  1) Tamiflu 75 mg one bid #3 tablets which will complete course thru 09-28-12 2)doxycycline 100 mg one bid #5 tablets which will complete course thru 09-29-12.  To Dorrene German [phone]  After these scripts called in to MD voice line, I phoned the pharmacist to be sure that the medications would be available this PM, however the [facility] pharmacy is closed tonight. We have been able to give patient the doses of doxycycline and tamiflu for this PM, and they will go to [facility] pharmacy tomorrow AM.  Ila Mcgill, MD

## 2012-09-27 NOTE — Progress Notes (Signed)
Medical Oncology  See full note this AM also. Patient has been stable thru day. K+ up to 3.9 with rest of Bmet ok, WBC 3.6, ANC 2.0, Hgb 10 and ple 52. Appreciate note from Dr Arlean Hopping - he recommends DC home on K+ 40 mEq bid x 5 days (not q 6 hours as I put in DC instructions; will verbally instruct patient and wife for 40 mEq bid beginning 09-28-12 as he has already had 3 dses in hospital today).. He needs to continue tamiflu thru 3-17 and doxycycline thru 3-18.  Dr Kalman Drape office will be in touch with him to recheck labs this coming week, then for visit week of 10-05-12.  Dr Truett Perna will do DC summary.  Ila Mcgill, MD

## 2012-09-27 NOTE — Progress Notes (Signed)
  09/27/2012, 7:58 AM  Hospital day: 12 Antibiotics/antivirals: doxycycline, tamiflu, valtrex Revlimid maintenance held  Patient seen on call for Dr Truett Perna. Appreciate consult by nephrology.  Subjective: Awake,alert, anxious to go home. Feels much better overall, walking in hall without difficulty. Minimal cough, no SOB, no chest discomfort. 4-5 bowel movements yesterday, one so far today. Able to swallow the oral K+. No bleeding. Feels he is drinking fluids adequately.  Objective: Vital signs in last 24 hours: Blood pressure 145/90, pulse 87, temperature 98.5 F (36.9 C), temperature source Oral, resp. rate 18, height 6' (1.829 m), weight 187 lb 6.3 oz (85 kg), SpO2 95.00%.   Intake/Output from previous day: 03/15 0701 - 03/16 0700 In: 1486 [P.O.:1080; I.V.:406] Out: 3050 [Urine:3050] Intake/Output this shift:    Physical exam: awake,alert, looks comfortable in bed on RA. PICC RUE site ok, no maintenance IVF since yesterday. PERRL. Oral mucosa clear and moist. Lungs clear ant/ lat. Heart RRR. Abdomen soft, not tender, normal BS. LE no edema, cords, tenderness. No ecchymoses or petechiae. Lab Results:  Recent Labs  09/25/12 0545 09/26/12 0510  WBC 6.3 3.5*  HGB 8.2* 9.0*  HCT 23.5* 25.0*  PLT 52* 47*   BMET  Recent Labs  09/26/12 1000 09/26/12 1630  NA 135 134*  K 3.1* 3.3*  CL 99 101  CO2 23 24  GLUCOSE 161* 125*  BUN 11 13  CREATININE 0.59 0.65  CALCIUM 8.5 7.9*   BMETordered now  Urine studies pending  Medications reviewed.Presently on KCl 40 mEq 4x daily. Spironolactone not begun, renal waiting on urine studies first.  Studies/Results: No results found.   Assessment/Plan: 1.Multiple myeloma: most recently post second autologous stem cell transplant in Sept 2013 and on maintenance Revlimid (held this admission). 2.Pneumonia: improved with Rx of influenza A. 3.Neutropenia resolved by CBC 3-15. Thrombocytopenia by last CBC, no bleeding.  4.  Hypokalemia: on oral potassium 40 mEq q 6 hrs. Nephrology involved, may begin spironolactone depending on labs in process. 5.PICC in  Possible DC later today depending on labs pending and nephrology recommendations. From standpoint of pneumonia and MM he would be ok for outpatient follow up.  Spoke with wife here now.  Bhumi Godbey P

## 2012-09-27 NOTE — Progress Notes (Signed)
Subjective: Feels good, K up to 3.9  Objective Vital signs in last 24 hours: Filed Vitals:   09/27/12 0935 09/27/12 0936 09/27/12 1409 09/27/12 1430  BP: 134/83 127/85 147/96 142/88  Pulse: 94 108 120 102  Temp:    98.3 F (36.8 C)  TempSrc:    Oral  Resp:    18  Height:      Weight:      SpO2:    97%   Weight change:   Intake/Output Summary (Last 24 hours) at 09/27/12 1627 Last data filed at 09/27/12 1500  Gross per 24 hour  Intake 2205.5 ml  Output   2900 ml  Net -694.5 ml   Labs: Basic Metabolic Panel:  Recent Labs Lab 09/26/12 0510 09/26/12 1000 09/26/12 1630 09/27/12 1112  NA 136 135 134* 135  K 3.5 3.1* 3.3* 3.9  CL 102 99 101 103  CO2 24 23 24 22   GLUCOSE 90 161* 125* 106*  BUN 9 11 13 14   CREATININE 0.53 0.59 0.65 0.63  CALCIUM 8.2* 8.5 7.9* 8.5  PHOS  --  2.1*  --   --    Liver Function Tests:  Recent Labs Lab 09/21/12 1545 09/22/12 0411  AST 59* 67*  ALT 36 46  ALKPHOS 46 55  BILITOT 0.5 0.6  PROT 6.3 6.5  ALBUMIN 2.4* 2.3*   No results found for this basename: LIPASE, AMYLASE,  in the last 168 hours No results found for this basename: AMMONIA,  in the last 168 hours CBC:  Recent Labs Lab 09/24/12 1845 09/25/12 0545 09/26/12 0510 09/27/12 1112  WBC 8.6 6.3 3.5* 3.6*  NEUTROABS 7.7 5.3 2.2 2.0  HGB 7.7* 8.2* 9.0* 10.0*  HCT 21.8* 23.5* 25.0* 28.5*  MCV 91.6 91.8 91.2 93.1  PLT 54* 52* 47* 52*   PT/INR: @LABRCNTIP (inr:5)   Scheduled Meds ) . doxycycline  100 mg Oral Q12H  . feeding supplement  1 Container Oral TID BM  . oseltamivir  75 mg Oral BID  . potassium chloride  40 mEq Oral QID  . valACYclovir  500 mg Oral Daily    Physical Exam:  Blood pressure 142/88, pulse 102, temperature 98.3 F (36.8 C), temperature source Oral, resp. rate 18, height 6' (1.829 m), weight 85 kg (187 lb 6.3 oz), SpO2 97.00%.  Gen: alert, no distress  Skin: no rash, cyanosis  HEENT: EOMI, sclera anicteric, throat clear and moist  Neck: no  JVD, no LAN  Chest: clear bilat  CV: regular, no rub or gallop, no murmur, no carotid or femoral bruits, pedal pulses intact  Abdomen: soft, nontender, no ascites or HSM  Ext: no LE edema , no joint effusion or deformity, no gangrene or ulceration  Neuro: alert, Ox3, no focal deficit   Impression/Plan  1. Hypokalemia- in patient w hx of MM and second autologous stem cell transplant at [facility] done Sept 2013. Urine studies argue against renal K+ wasting. K+ up to 3.9 today with increased replacement. He does have high UOP and one thing that came to mind was renal salt-wasting, which is rare, but has been associated with myeloma. It would present with hypovolemia and a high urine na, something to keep in mind if he returns with more electrolyte abnormalities. Regardless, he is doing much better and is OK for discharge from renal standpoint. Recommend continue KCL 40 bid for another 5 days at home then stop.  2. Hx multiple myeloma, s/p SCT x 2 3. PNA/flu- better    Chad [name]  Chad Wunschel  MD [phone] pgr    [phone] cell 09/27/2012, 4:27 PM

## 2012-09-27 NOTE — Plan of Care (Signed)
Problem: Discharge Progression Outcomes Goal: Other Discharge Outcomes/Goals Outcome: Completed/Met Date Met:  09/27/12 Discharge instructions explained to pt and his wife

## 2012-09-27 NOTE — Telephone Encounter (Signed)
Medical Oncology   Potassium prescription changed after all of DC information entered on 09-27-12, so the correct new potassium script was called in now to [facility] [phone]:  KCl 40 mEq bid x 5 days no refills.  Ila Mcgill, MD

## 2012-09-28 ENCOUNTER — Other Ambulatory Visit: Payer: Self-pay | Admitting: *Deleted

## 2012-09-28 NOTE — Discharge Summary (Signed)
Physician Discharge Summary  Patient ID: Chad [name]  MRN: [id number] DOB/AGE: [id number] y.o.  Admit date: 09/16/2012 Discharge date: 09/27/2012    Discharge Diagnoses:  1. Influenza A/pneumonia. 2. Neutropenia/thrombocytopenia most likely related to Revlimid and acute infection. 3. Hypokalemia, unclear etiology. 4. Loose stools on hospital admission, C. difficile toxin assay negative. 5. Multiple myeloma with maintenance Revlimid currently on hold.  Discharged Condition: Stable.  Discharge Labs:  hemoglobin 10.0, white count 3.6, absolute neutrophil count 2.0, platelet count 52,000, potassium 3.9.  Significant Diagnostic Studies:  1. Chest CT 09/16/2012 right lower lobe lobar pneumonia; scattered groundglass opacities within the left and right lung also suggest pulmonary infection. 2. Chest CT 09/21/2012 multi-lobar airspace consolidation and ground glass are most indicative of pneumonia, progressive from 09/16/2012. 3. Chest x-ray 09/25/2012 no change in bilateral patchy alveolar opacities concerning for pneumonia.   Consults:  1. Dr. Sung Amabile, pulmonary. 2. Dr. Ninetta Lights, infectious disease. 3. Dr. Arlean Hopping, nephrology.  Procedures:  1. Bronchoscopy 09/21/2012.  Disposition: 01-Home or Self Care     Medication List    STOP taking these medications       aspirin 81 MG tablet     lenalidomide 10 MG capsule  Commonly known as:  REVLIMID      TAKE these medications       acetaminophen 325 MG tablet  Commonly known as:  TYLENOL  Take 650 mg by mouth every 6 (six) hours as needed.     Calcium 600-200 MG-UNIT per tablet  Take 1 tablet by mouth 2 (two) times daily.     doxycycline 100 MG tablet  Commonly known as:  VIBRA-TABS  Take 1 tablet (100 mg total) by mouth every 12 (twelve) hours.     guaiFENesin 600 MG 12 hr tablet  Commonly known as:  MUCINEX  Take 1,200 mg by mouth as needed.     loperamide 2 MG capsule  Commonly known as:  IMODIUM  Take 2 mg by  mouth 4 (four) times daily as needed for diarrhea or loose stools.     oseltamivir 75 MG capsule  Commonly known as:  TAMIFLU  Take 1 capsule (75 mg total) by mouth 2 (two) times daily.     potassium chloride SA 20 MEQ tablet  Commonly known as:  K-DUR,KLOR-CON  Take 2 tablets (40 mEq total) by mouth twice daily for 5 days.     prochlorperazine 10 MG tablet  Commonly known as:  COMPAZINE  Take 1 tablet (10 mg total) by mouth every 6 (six) hours as needed.     sodium chloride 0.65 % Soln nasal spray  Commonly known as:  OCEAN  Place 1 spray into the nose as needed for congestion.     valACYclovir 500 MG tablet  Commonly known as:  VALTREX  Take 1 tablet (500 mg total) by mouth daily.     ZOMETA IV  Inject 4 mg into the vein every 3 (three) months.          Hospital Course: Chad [name] is a 57 year old man with long-standing multiple myeloma on maintenance Revlimid. He was hospitalized on 09/16/2012 with persistent fever and cough. Admission CBC showed a hemoglobin of 12.7, white count 1.4, absolute neutrophil count 0.6 and platelet count 92,000. Cultures were obtained including 2 sets of blood cultures and a urine culture. Antibiotics initiated with vancomycin and Aztreonam. Chest CT showed right lower lobe lobar pneumonia and scattered groundglass opacities within the left and right lung also suggesting pulmonary infection. He  did not improve with broad-spectrum antibiotic coverage. The blood and urine cultures remained negative. Initial nasal swab for influenza was negative. He was followed by Dr. Ninetta Lights, infectious disease. Antifungal coverage was added as was clindamycin.  Followup chest CT on 09/21/2012 showed interval worsening in multifocal patchy airspace consolidation and ground glass involving all lobes of both lungs.   Pulmonary consultation was obtained. He was taken to a bronchoscopy procedure by Dr. Sung Amabile on 09/21/2012. The bronchial mucosa was normal. There were  moderate watery secretions. There were no endobronchial tumors, masses or foreign bodies.  Influenza A was detected on the bronchoalveolar lavage sample. Tamiflu was initiated. Antibiotic coverage was adjusted and antifungal coverage discontinued.  The fever curve and his clinical status began to improve. Antibiotic coverage was further adjusted from vancomycin/aztreonam to doxycycline with plans to complete 5 more days. 5 days of Tamiflu planned as well. He remained afebrile for approximately 5 days prior to discharge.  He developed hypokalemia during the hospitalization. The etiology was unclear. He was seen by Dr. Arlean Hopping, nephrology. Dr. Arlean Hopping felt that renal potassium wasting was unlikely based on the urine studies. Renal salt-wasting was considered and would be something to keep in mind if he returned with more electrolyte abnormalities. The hypokalemia improved with replacement. Dr. Arlean Hopping recommended continuing KCl 40 mg once twice daily for another 5 days at home and then discontinue.  Chad [name] was felt to be stable for discharge home on 09/27/2012. He will return for a lab visit the week of 09/28/2012 and an office visit in 2 weeks.       Discharge Orders   Future Orders Complete By Expires     Diet - low sodium heart healthy  As directed     Discharge instructions  As directed     Comments:      Call if fever or bleeding    Increase activity slowly  As directed        Signed: Lonna Cobb 09/28/2012, 3:55 PM

## 2012-09-29 ENCOUNTER — Telehealth: Payer: Self-pay | Admitting: *Deleted

## 2012-09-29 LAB — MISCELLANEOUS TEST

## 2012-09-29 NOTE — Telephone Encounter (Signed)
Called wife with appointment for labs on 3/19 at 1:30pm. Instructed her to have Westly go to scheduling afterwards tomorrow for next week's lab/OV appointment.

## 2012-09-30 ENCOUNTER — Other Ambulatory Visit (HOSPITAL_BASED_OUTPATIENT_CLINIC_OR_DEPARTMENT_OTHER): Payer: BC Managed Care – PPO

## 2012-09-30 LAB — CBC WITH DIFFERENTIAL/PLATELET
Basophils Absolute: 0.1 10*3/uL (ref 0.0–0.1)
Eosinophils Absolute: 0.1 10*3/uL (ref 0.0–0.5)
HGB: 10.9 g/dL — ABNORMAL LOW (ref 13.0–17.1)
LYMPH%: 20.8 % (ref 14.0–49.0)
MONO#: 0.3 10*3/uL (ref 0.1–0.9)
NEUT#: 2.7 10*3/uL (ref 1.5–6.5)
Platelets: 46 10*3/uL — ABNORMAL LOW (ref 140–400)
RBC: 3.31 10*6/uL — ABNORMAL LOW (ref 4.20–5.82)
RDW: 14.9 % — ABNORMAL HIGH (ref 11.0–14.6)
WBC: 4.1 10*3/uL (ref 4.0–10.3)

## 2012-09-30 LAB — BASIC METABOLIC PANEL (CC13)
CO2: 22 mEq/L (ref 22–29)
Glucose: 97 mg/dl (ref 70–99)
Potassium: 4.7 mEq/L (ref 3.5–5.1)
Sodium: 137 mEq/L (ref 136–145)

## 2012-09-30 LAB — CMV (CYTOMEGALOVIRUS) DNA ULTRAQUANT, PCR: CMV DNA Quant: <200 copies/mL (ref <200)

## 2012-09-30 LAB — CYTOMEGALOVIRUS PCR, QUALITATIVE

## 2012-10-01 ENCOUNTER — Telehealth: Payer: Self-pay | Admitting: *Deleted

## 2012-10-01 ENCOUNTER — Telehealth: Payer: Self-pay | Admitting: Oncology

## 2012-10-01 NOTE — Telephone Encounter (Signed)
Talked to pt and he is are aware of appt on 3/26 lab and MD

## 2012-10-01 NOTE — Telephone Encounter (Signed)
Notified of CBC and Bmet results. He will continue Ktab 20 meq bid per renal orders. Follow up next week as scheduled.

## 2012-10-01 NOTE — Telephone Encounter (Signed)
Message copied by Wandalee Ferdinand on Thu Oct 01, 2012 10:43 AM ------      Message from: Thornton Papas B      Created: Wed Sep 30, 2012  6:25 PM       Please call patient, wbcs ok, platelets still low and stable, ? How much K+ is he taking.  He can continue as directed by nephrology. F/u as schedued ------

## 2012-10-07 ENCOUNTER — Ambulatory Visit (HOSPITAL_BASED_OUTPATIENT_CLINIC_OR_DEPARTMENT_OTHER): Payer: BC Managed Care – PPO | Admitting: Oncology

## 2012-10-07 ENCOUNTER — Telehealth: Payer: Self-pay | Admitting: Oncology

## 2012-10-07 ENCOUNTER — Other Ambulatory Visit (HOSPITAL_BASED_OUTPATIENT_CLINIC_OR_DEPARTMENT_OTHER): Payer: BC Managed Care – PPO | Admitting: Lab

## 2012-10-07 VITALS — BP 140/90 | HR 97 | Temp 97.0°F | Resp 18 | Ht 72.0 in | Wt 193.9 lb

## 2012-10-07 DIAGNOSIS — C9000 Multiple myeloma not having achieved remission: Secondary | ICD-10-CM

## 2012-10-07 DIAGNOSIS — D696 Thrombocytopenia, unspecified: Secondary | ICD-10-CM

## 2012-10-07 LAB — CBC WITH DIFFERENTIAL/PLATELET
BASO%: 1.4 % (ref 0.0–2.0)
EOS%: 2.5 % (ref 0.0–7.0)
HCT: 29.2 % — ABNORMAL LOW (ref 38.4–49.9)
LYMPH%: 25.5 % (ref 14.0–49.0)
MCH: 33.7 pg — ABNORMAL HIGH (ref 27.2–33.4)
MCHC: 34.4 g/dL (ref 32.0–36.0)
MONO%: 8.5 % (ref 0.0–14.0)
NEUT%: 62.1 % (ref 39.0–75.0)
Platelets: 25 10*3/uL — ABNORMAL LOW (ref 140–400)

## 2012-10-07 LAB — BASIC METABOLIC PANEL (CC13)
Calcium: 9.6 mg/dL (ref 8.4–10.4)
Creatinine: 0.9 mg/dL (ref 0.7–1.3)

## 2012-10-07 NOTE — Telephone Encounter (Signed)
Gave pt appt for labs on MArch 2014 and see MD with labs on April 2014

## 2012-10-07 NOTE — Progress Notes (Signed)
[facility]    OFFICE PROGRESS NOTE   INTERVAL HISTORY:   He was discharged on 09/27/2012 after an admission with pneumonia. He was diagnosed with influenza A. Pneumonia. Prior to the diagnosis of influenza A. He was maintained on broad-spectrum intravenous antibiotics. He completed an outpatient course of doxycycline in addition to the prescribed course of Tamiflu.  Chad [name] reports feeling much better. He has returned to work. No fever. There is a persistent nonproductive cough. His appetite is much improved. There are occasional "clots "when he blows his nose. No other bleeding.  Objective:  Vital signs in last 24 hours:  Blood pressure 140/90, pulse 97, temperature 97 F (36.1 C), temperature source Oral, resp. rate 18, height 6' (1.829 m), weight 193 lb 14.4 oz (87.952 kg).    HEENT: no thrush or ulcers. No bleeding. Single petechiae at the left buccal mucosa. Resp: lungs clear bilaterally and anterior and posterior lung fields Cardio: regular rate and rhythm GI: no hepatosplenomegaly Vascular: no leg edema   Lab Results:  Lab Results  Component Value Date   WBC 3.8* 10/07/2012   HGB 10.0* 10/07/2012   HCT 29.2* 10/07/2012   MCV 98.2* 10/07/2012   PLT 25* 10/07/2012  ANC 2.3  09/30/2012-platelets 46,000     Medications: I have reviewed the patient's current medications.  Assessment/Plan: 1. Multiple myeloma-status post high-dose chemotherapy with autologous stem cell support in November of 2004. The sero monoclonal protein was slowly rising and a restaging bone marrow biopsy in November 2009 confirmed 10 to 20% plasma cells. The serum light chains were elevated 07/25/2008. He completed 12 cycles of salvage therapy with Revlimid/Decadron with the last cycle initiated 06/16/2009. On 07/13/2009 the serum free light chains were normal and a serum protein electrophoresis/immunofixation revealed no monoclonal protein. Elevated serum M spike and serum free  kappa light chains March 2013. He began cycle 1 of Revlimid 25 mg daily x21 days followed by a 7 day break on 09/27/2011. He began a second cycle of salvage Revlimid beginning on 10/25/2011. The serum M spike was stable and the serum free kappa light chains were slightly more elevated on 11/19/2011. He began another cycle of Revlimid/Decadron on 11/22/2011. Velcade was added beginning on 11/29/2011. He completed 3 cycles of Revlimid/Velcade/Decadron with improvement in the serum M spike/serum free kappa light chains. The last cycle was initiated on 01/20/2012. He received melphalan 200 mg per meter squared on 03/17/2012 with autologous stem cell support on 03/18/2012. Serum immunofixation confirmed a monoclonal IgG kappa protein, too low to quantify and a mild elevation of the serum free kappa light chains on 05/19/2012. He began maintenance Revlimid on 07/11/2012. Serum M spike 0.25, serum free kappa light chains 3.04 on 08/07/2012. Revlimid was placed on hold during the 09/16/2012 hospital admission 2. A serum immunofixation confirmed a monoclonal protein and the serum Kappa and Lambda light chains were mildly elevated in June of 2012.  3. History of elevated liver enzymes. 4. Right lung pneumonia June, 2009 -- resolved after treatment with Bactrim and azithromycin. 5. 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 He continues to have intermittent numbness in the feet. 6. History of hypokalemia -he is no longer taking a potassium supplement 7. Pneumonia 01/06/2009 -- requiring hospitalization 01/10/2011 -- status post broad spectrum antibiotic therapy with clinical resolution. 8. Upper respiratory infection in February, 2011 -- likely a viral URI.  9. Right shoulder/upper arm pain. Plain x-ray of the right  shoulder/humerus on 10/16/2011 showed 2 small lytic lesions at the proximal humeral shaft suspicious for myeloma involvement. MRI of the right  shoulder 10/27/2011 confirmed multiple myelomatous lesions involving the proximal right humerus, scapula, and ribs. A large destructive lesion was noted in the scapular body with an associated soft tissue component. He completed palliative radiation to the right scapula on 11/19/2011 with partial improvement in the shoulder discomfort. 10. "Hemorrhoid" discomfort and urinary hesitancy on 11/15/2011. He appeared to have an ulcer or fissure at the anal verge. Symptoms have resolved. 11. Fever/sepsis/bacteremia/question pneumonia September 2013 following stem cell therapy. 12. Admission with pneumonia 09/16/2012-influenza A was confirmed on a bronchoscopy03/04/2013, he was treated with Tamiflu. 13. Thrombocytopenia-present on hospital admission 09/16/2012-likely related to the recent infection. The thrombocytopenia could also be related to Revlimid, antibiotics, or progression of the multiple myeloma. 14. Abundant atypical squamous cells on the bronchial washing cytology from 09/21/2012-most likely secondary to reactive changes, no clinical history to suggest a diagnosis of non-small cell lung cancer.  Disposition:  He appears much improved since discharge from the hospital. The maintenance Revlimid remains on hold. He has severe thrombocytopenia. This is most likely related to the recent systemic infection, though the thrombocytopenia could be related to polypharmacy, progression of the multiple myeloma, or ITP. He knows to contact us for bleeding for spontaneous bruising. Chad [name] will return for a CBC on 10/09/2012. He will be scheduled for office visit on 10/15/2012. We will obtain a repeat chest x-ray when he returns for a lab visit on 10/09/2012.   Thornton Papas, MD  10/07/2012  2:55 PM

## 2012-10-09 ENCOUNTER — Telehealth: Payer: Self-pay | Admitting: *Deleted

## 2012-10-09 ENCOUNTER — Other Ambulatory Visit (HOSPITAL_BASED_OUTPATIENT_CLINIC_OR_DEPARTMENT_OTHER): Payer: BC Managed Care – PPO | Admitting: Lab

## 2012-10-09 ENCOUNTER — Ambulatory Visit (HOSPITAL_COMMUNITY)
Admission: RE | Admit: 2012-10-09 | Discharge: 2012-10-09 | Disposition: A | Discharge status: Home / Self Care | Payer: BC Managed Care – PPO | Source: Ambulatory Visit | Attending: Oncology | Admitting: Oncology

## 2012-10-09 DIAGNOSIS — C9000 Multiple myeloma not having achieved remission: Secondary | ICD-10-CM

## 2012-10-09 DIAGNOSIS — Z8701 Personal history of pneumonia (recurrent): Secondary | ICD-10-CM | POA: Insufficient documentation

## 2012-10-09 LAB — CBC WITH DIFFERENTIAL/PLATELET
Basophils Absolute: 0 10*3/uL (ref 0.0–0.1)
EOS%: 4.7 % (ref 0.0–7.0)
HGB: 9.2 g/dL — ABNORMAL LOW (ref 13.0–17.1)
MCH: 33.7 pg — ABNORMAL HIGH (ref 27.2–33.4)
NEUT#: 1.3 10*3/uL — ABNORMAL LOW (ref 1.5–6.5)
RDW: 16.4 % — ABNORMAL HIGH (ref 11.0–14.6)
WBC: 2.8 10*3/uL — ABNORMAL LOW (ref 4.0–10.3)
lymph#: 1 10*3/uL (ref 0.9–3.3)

## 2012-10-09 LAB — CHCC SMEAR

## 2012-10-09 NOTE — Telephone Encounter (Signed)
CBC reviewed by Dr. Truett Perna. HOLD Revlimid. Call office for bleeding. Return 3/31 for repeat CBC. Spoke with pt and wife in lobby instructions given, both voiced understanding. Pt taken to schedulers for appt Mon. He understands to wait for RN in lobby.

## 2012-10-09 NOTE — Telephone Encounter (Signed)
gve the pt his march 2014 appt calendar with the lab appt.

## 2012-10-12 ENCOUNTER — Telehealth: Payer: Self-pay | Admitting: *Deleted

## 2012-10-12 ENCOUNTER — Other Ambulatory Visit (HOSPITAL_BASED_OUTPATIENT_CLINIC_OR_DEPARTMENT_OTHER): Payer: BC Managed Care – PPO | Admitting: Lab

## 2012-10-12 DIAGNOSIS — C9000 Multiple myeloma not having achieved remission: Secondary | ICD-10-CM

## 2012-10-12 LAB — CBC WITH DIFFERENTIAL/PLATELET
BASO%: 0.8 % (ref 0.0–2.0)
HCT: 28.1 % — ABNORMAL LOW (ref 38.4–49.9)
HGB: 9.6 g/dL — ABNORMAL LOW (ref 13.0–17.1)
MCHC: 34.2 g/dL (ref 32.0–36.0)
MONO#: 0.4 10*3/uL (ref 0.1–0.9)
NEUT%: 59.5 % (ref 39.0–75.0)
WBC: 3.7 10*3/uL — ABNORMAL LOW (ref 4.0–10.3)
lymph#: 0.9 10*3/uL (ref 0.9–3.3)

## 2012-10-12 NOTE — Telephone Encounter (Signed)
Received message from pt asking "Can you come out in lobby after my blood work today and give me a copy; also let MD review?"  Per Dr. Truett Perna, gave copy of labs to pt in lobby informing pt that MD stated labs were stable.  Pt also asking if he can take Mucinex for his congestion.  Per MD, OK for pt to take mucinex for congestion/drainage.  Pt verbalized understanding of instructions and confirmed appt for 10/15/12.

## 2012-10-15 ENCOUNTER — Other Ambulatory Visit (HOSPITAL_BASED_OUTPATIENT_CLINIC_OR_DEPARTMENT_OTHER): Payer: BC Managed Care – PPO | Admitting: Lab

## 2012-10-15 ENCOUNTER — Ambulatory Visit (HOSPITAL_BASED_OUTPATIENT_CLINIC_OR_DEPARTMENT_OTHER): Payer: BC Managed Care – PPO | Admitting: Oncology

## 2012-10-15 ENCOUNTER — Telehealth: Payer: Self-pay | Admitting: Oncology

## 2012-10-15 VITALS — BP 137/82 | HR 83 | Temp 97.0°F | Resp 20 | Ht 72.0 in | Wt 197.8 lb

## 2012-10-15 DIAGNOSIS — D61818 Other pancytopenia: Secondary | ICD-10-CM

## 2012-10-15 DIAGNOSIS — C9000 Multiple myeloma not having achieved remission: Secondary | ICD-10-CM

## 2012-10-15 LAB — CBC WITH DIFFERENTIAL/PLATELET
Basophils Absolute: 0 10*3/uL (ref 0.0–0.1)
Eosinophils Absolute: 0.2 10*3/uL (ref 0.0–0.5)
HCT: 25.7 % — ABNORMAL LOW (ref 38.4–49.9)
HGB: 8.9 g/dL — ABNORMAL LOW (ref 13.0–17.1)
MCH: 34 pg — ABNORMAL HIGH (ref 27.2–33.4)
MONO#: 0.3 10*3/uL (ref 0.1–0.9)
NEUT#: 1.5 10*3/uL (ref 1.5–6.5)
NEUT%: 49 % (ref 39.0–75.0)
lymph#: 1 10*3/uL (ref 0.9–3.3)

## 2012-10-15 LAB — CULTURE, FUNGUS WITHOUT SMEAR

## 2012-10-15 NOTE — Telephone Encounter (Signed)
GAve pt appt for April 2014 labs and ML visit

## 2012-10-15 NOTE — Progress Notes (Signed)
[facility]    OFFICE PROGRESS NOTE   INTERVAL HISTORY:   He returns as scheduled. He continues to have a nonproductive cough and "sinus "drainage. No dyspnea or fever. No bleeding. He has returned to work.  Objective:  Vital signs in last 24 hours:  Blood pressure 137/82, pulse 83, temperature 97 F (36.1 C), temperature source Oral, resp. rate 20, height 6' (1.829 m), weight 197 lb 12.8 oz (89.721 kg).    HEENT: No thrush or bleeding. No sinus tenderness. Resp: Lungs clear bilaterally Cardio: Regular rate and rhythm GI: No hepatosplenomegaly, nontender Vascular: No leg edema   Lab Results:  Lab Results  Component Value Date   WBC 3.0* 10/15/2012   HGB 8.9* 10/15/2012   HCT 25.7* 10/15/2012   MCV 98.6* 10/15/2012   PLT 29* 10/15/2012   ANC 1.5  Review the peripheral blood smear from 10/09/2012-a few teardrops, the platelets are decreased in number  X-rays: Chest x-ray on 10/01/2012, compared to 09/25/2012-patchy alveolar opacities are significantly improved. Residual densities remain at the lung bases.  Medications: I have reviewed the patient's current medications.  Assessment/Plan: 1. Multiple myeloma-status post high-dose chemotherapy with autologous stem cell support in November of 2004. The sero monoclonal protein was slowly rising and a restaging bone marrow biopsy in November 2009 confirmed 10 to 20% plasma cells. The serum light chains were elevated 07/25/2008. He completed 12 cycles of salvage therapy with Revlimid/Decadron with the last cycle initiated 06/16/2009. On 07/13/2009 the serum free light chains were normal and a serum protein electrophoresis/immunofixation revealed no monoclonal protein. Elevated serum M spike and serum free kappa light chains March 2013. He began cycle 1 of Revlimid 25 mg daily x21 days followed by a 7 day break on 09/27/2011. He began a second cycle of salvage Revlimid beginning on 10/25/2011. The serum M spike was stable  and the serum free kappa light chains were slightly more elevated on 11/19/2011. He began another cycle of Revlimid/Decadron on 11/22/2011. Velcade was added beginning on 11/29/2011. He completed 3 cycles of Revlimid/Velcade/Decadron with improvement in the serum M spike/serum free kappa light chains. The last cycle was initiated on 01/20/2012. He received melphalan 200 mg per meter squared on 03/17/2012 with autologous stem cell support on 03/18/2012. Serum immunofixation confirmed a monoclonal IgG kappa protein, too low to quantify and a mild elevation of the serum free kappa light chains on 05/19/2012. He began maintenance Revlimid on 07/11/2012. Serum M spike 0.25, serum free kappa light chains 3.04 on 08/07/2012. Revlimid was placed on hold during the 09/16/2012 hospital admission 2. A serum immunofixation confirmed a monoclonal protein and the serum Kappa and Lambda light chains were mildly elevated in June of 2012.  3. History of elevated liver enzymes. 4. Right lung pneumonia June, 2009 -- resolved after treatment with Bactrim and azithromycin. 5. 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 He continues to have intermittent numbness in the feet. 6. History of hypokalemia -he is no longer taking a potassium supplement 7. Pneumonia 01/06/2009 -- requiring hospitalization 01/10/2011 -- status post broad spectrum antibiotic therapy with clinical resolution. 8. Upper respiratory infection in February, 2011 -- likely a viral URI.  9. Right shoulder/upper arm pain. Plain x-ray of the right shoulder/humerus on 10/16/2011 showed 2 small lytic lesions at the proximal humeral shaft suspicious for myeloma involvement. MRI of the right shoulder 10/27/2011 confirmed multiple myelomatous lesions involving the proximal right humerus, scapula, and ribs. A large  destructive lesion was noted in the scapular body with an associated soft tissue component. He  completed palliative radiation to the right scapula on 11/19/2011 with partial improvement in the shoulder discomfort. 10. "Hemorrhoid" discomfort and urinary hesitancy on 11/15/2011. He appeared to have an ulcer or fissure at the anal verge. Symptoms have resolved. 11. Fever/sepsis/bacteremia/question pneumonia September 2013 following stem cell therapy. 12. Admission with pneumonia 09/16/2012-influenza A was confirmed on a bronchoscopy03/04/2013, he was treated with Tamiflu. 13. Thrombocytopenia-present on hospital admission 09/16/2012-likely related to the recent infection. The thrombocytopenia could also be related to Revlimid, antibiotics, or progression of the multiple myeloma. 14. Abundant atypical squamous cells on the bronchial washing cytology from 09/21/2012-most likely secondary to reactive changes, no clinical history to suggest a diagnosis of non-small cell lung cancer. 15. Persistent severe anemia-? Related to the recent infection, myelodysplasia, or progression of the myeloma  Disposition:  His performance status continues to improve. Mr. Codispoti has persistent pancytopenia. This may be related to the recent severe infection, progression of the myeloma, or myelodysplasia. We will check a CBC weekly with a followup serum protein electrophoresis and light chain analysis on 10/21/2012. The plan is to proceed with a diagnostic bone marrow biopsy for persistent pancytopenia. Mr. Manzer will return for an office visit on 11/04/2012. He will contact us in the interim for a fever or bleeding.   Thornton Papas, MD  10/15/2012  5:35 PM

## 2012-10-19 LAB — FUNGUS CULTURE W SMEAR: Fungal Smear: NONE SEEN

## 2012-10-21 ENCOUNTER — Other Ambulatory Visit (HOSPITAL_BASED_OUTPATIENT_CLINIC_OR_DEPARTMENT_OTHER): Payer: BC Managed Care – PPO | Admitting: Lab

## 2012-10-21 DIAGNOSIS — C9000 Multiple myeloma not having achieved remission: Secondary | ICD-10-CM

## 2012-10-21 LAB — COMPREHENSIVE METABOLIC PANEL (CC13)
ALT: 17 U/L (ref 0–55)
AST: 18 U/L (ref 5–34)
Albumin: 3.7 g/dL (ref 3.5–5.0)
BUN: 9.9 mg/dL (ref 7.0–26.0)
CO2: 22 mEq/L (ref 22–29)
Calcium: 9.4 mg/dL (ref 8.4–10.4)
Chloride: 108 mEq/L — ABNORMAL HIGH (ref 98–107)
Creatinine: 0.9 mg/dL (ref 0.7–1.3)
Potassium: 4.3 mEq/L (ref 3.5–5.1)

## 2012-10-21 LAB — CBC WITH DIFFERENTIAL/PLATELET
Eosinophils Absolute: 0.1 10*3/uL (ref 0.0–0.5)
HCT: 28 % — ABNORMAL LOW (ref 38.4–49.9)
LYMPH%: 33.5 % (ref 14.0–49.0)
MONO#: 0.2 10*3/uL (ref 0.1–0.9)
NEUT#: 1.5 10*3/uL (ref 1.5–6.5)
NEUT%: 55.3 % (ref 39.0–75.0)
Platelets: 49 10*3/uL — ABNORMAL LOW (ref 140–400)
WBC: 2.8 10*3/uL — ABNORMAL LOW (ref 4.0–10.3)
lymph#: 0.9 10*3/uL (ref 0.9–3.3)
nRBC: 0 % (ref 0–0)

## 2012-10-23 LAB — KAPPA/LAMBDA LIGHT CHAINS: Kappa:Lambda Ratio: 0.65 (ref 0.26–1.65)

## 2012-10-23 LAB — PROTEIN ELECTROPHORESIS, SERUM
Alpha-1-Globulin: 4.3 % (ref 2.9–4.9)
Alpha-2-Globulin: 11.7 % (ref 7.1–11.8)
Beta 2: 5.3 % (ref 3.2–6.5)
Beta Globulin: 5.9 % (ref 4.7–7.2)
Gamma Globulin: 16.7 % (ref 11.1–18.8)

## 2012-10-26 ENCOUNTER — Telehealth: Payer: Self-pay | Admitting: *Deleted

## 2012-10-26 ENCOUNTER — Encounter: Payer: Self-pay | Admitting: *Deleted

## 2012-10-26 NOTE — Telephone Encounter (Signed)
Patient wanted clarification on his M-spike result. Felt that 0.45 was higher than in January. Confirmed that it actually was and apologized for mistake in reporting it as lower, but Dr. Truett Perna still feels it is stable.

## 2012-10-28 ENCOUNTER — Telehealth: Payer: Self-pay | Admitting: *Deleted

## 2012-10-28 ENCOUNTER — Other Ambulatory Visit (HOSPITAL_BASED_OUTPATIENT_CLINIC_OR_DEPARTMENT_OTHER): Payer: BC Managed Care – PPO

## 2012-10-28 DIAGNOSIS — C9000 Multiple myeloma not having achieved remission: Secondary | ICD-10-CM

## 2012-10-28 LAB — CBC WITH DIFFERENTIAL/PLATELET
BASO%: 1.1 % (ref 0.0–2.0)
EOS%: 2.7 % (ref 0.0–7.0)
HCT: 30.4 % — ABNORMAL LOW (ref 38.4–49.9)
MCH: 35.1 pg — ABNORMAL HIGH (ref 27.2–33.4)
MCHC: 34.8 g/dL (ref 32.0–36.0)
MONO#: 0.2 10*3/uL (ref 0.1–0.9)
NEUT%: 52.4 % (ref 39.0–75.0)
RDW: 18.5 % — ABNORMAL HIGH (ref 11.0–14.6)
WBC: 2.8 10*3/uL — ABNORMAL LOW (ref 4.0–10.3)
lymph#: 1 10*3/uL (ref 0.9–3.3)

## 2012-10-28 NOTE — Telephone Encounter (Signed)
Received message from pt requesting lab results.  Per Dr. Truett Perna; called and spoke with pt informing him CBC results better.  Pt verbalized understanding and expressed appreciation for call back.

## 2012-11-04 ENCOUNTER — Ambulatory Visit (HOSPITAL_BASED_OUTPATIENT_CLINIC_OR_DEPARTMENT_OTHER): Payer: BC Managed Care – PPO

## 2012-11-04 ENCOUNTER — Other Ambulatory Visit (HOSPITAL_BASED_OUTPATIENT_CLINIC_OR_DEPARTMENT_OTHER): Payer: BC Managed Care – PPO

## 2012-11-04 ENCOUNTER — Ambulatory Visit (HOSPITAL_COMMUNITY)
Admission: RE | Admit: 2012-11-04 | Discharge: 2012-11-04 | Disposition: A | Discharge status: Home / Self Care | Payer: BC Managed Care – PPO | Source: Ambulatory Visit | Attending: Nurse Practitioner | Admitting: Nurse Practitioner

## 2012-11-04 ENCOUNTER — Telehealth: Payer: Self-pay | Admitting: Oncology

## 2012-11-04 ENCOUNTER — Ambulatory Visit (HOSPITAL_BASED_OUTPATIENT_CLINIC_OR_DEPARTMENT_OTHER): Payer: BC Managed Care – PPO | Admitting: Nurse Practitioner

## 2012-11-04 VITALS — BP 133/83 | HR 73 | Temp 98.0°F | Resp 18 | Ht 72.0 in | Wt 202.9 lb

## 2012-11-04 DIAGNOSIS — C9000 Multiple myeloma not having achieved remission: Secondary | ICD-10-CM | POA: Insufficient documentation

## 2012-11-04 DIAGNOSIS — D649 Anemia, unspecified: Secondary | ICD-10-CM

## 2012-11-04 DIAGNOSIS — M79609 Pain in unspecified limb: Secondary | ICD-10-CM

## 2012-11-04 DIAGNOSIS — M899 Disorder of bone, unspecified: Secondary | ICD-10-CM | POA: Insufficient documentation

## 2012-11-04 DIAGNOSIS — M949 Disorder of cartilage, unspecified: Secondary | ICD-10-CM | POA: Insufficient documentation

## 2012-11-04 LAB — CBC WITH DIFFERENTIAL/PLATELET
Basophils Absolute: 0 10*3/uL (ref 0.0–0.1)
EOS%: 2.4 % (ref 0.0–7.0)
Eosinophils Absolute: 0.1 10*3/uL (ref 0.0–0.5)
HCT: 32.6 % — ABNORMAL LOW (ref 38.4–49.9)
HGB: 11.5 g/dL — ABNORMAL LOW (ref 13.0–17.1)
MCH: 35.5 pg — ABNORMAL HIGH (ref 27.2–33.4)
MONO#: 0.3 10*3/uL (ref 0.1–0.9)
NEUT#: 1.7 10*3/uL (ref 1.5–6.5)
NEUT%: 54.6 % (ref 39.0–75.0)
RDW: 18.5 % — ABNORMAL HIGH (ref 11.0–14.6)
WBC: 3.2 10*3/uL — ABNORMAL LOW (ref 4.0–10.3)
lymph#: 1.1 10*3/uL (ref 0.9–3.3)

## 2012-11-04 LAB — AFB CULTURE WITH SMEAR (NOT AT ARMC): Acid Fast Smear: NONE SEEN

## 2012-11-04 MED ORDER — ZOLEDRONIC ACID 4 MG/100ML IV SOLN
4.0000 mg | Freq: Once | INTRAVENOUS | Status: AC
  Administered 2012-11-04: 4 mg via INTRAVENOUS
  Filled 2012-11-04: qty 100

## 2012-11-04 MED ORDER — SODIUM CHLORIDE 0.9 % IV SOLN
Freq: Once | INTRAVENOUS | Status: AC
  Administered 2012-11-04: 15:00:00 via INTRAVENOUS

## 2012-11-04 NOTE — Telephone Encounter (Signed)
gv and pritned appt sched and avs for pt for April and may....pt going to radiology and MW added tx.Marland KitchenMarland Kitchen

## 2012-11-04 NOTE — Progress Notes (Signed)
OFFICE PROGRESS NOTE  Interval history:  Chad [name] returns as scheduled. He continues to feel better. He denies fever. No shortness of breath. Energy level has improved. He has a good appetite. He is gaining weight.  Over the past 1 month he has noted pain at the right upper arm. No known injury. The pain tends to be position related.   Objective: Blood pressure 133/83, pulse 73, temperature 98 F (36.7 C), temperature source Oral, resp. rate 18, height 6' (1.829 m), weight 202 lb 14.4 oz (92.035 kg).  Oropharynx is without thrush or ulceration. Lungs are clear. No wheezes or rales. Regular cardiac rhythm. Abdomen is soft and nontender. No organomegaly. Extremities are without edema. He is nontender over the right upper arm.  Lab Results: Lab Results  Component Value Date   WBC 3.2* 11/04/2012   HGB 11.5* 11/04/2012   HCT 32.6* 11/04/2012   MCV 100.4* 11/04/2012   PLT 103* 11/04/2012    Chemistry:    Chemistry      Component Value Date/Time   NA 139 10/21/2012 0900   NA 135 09/27/2012 1112   K 4.3 10/21/2012 0900   K 3.9 09/27/2012 1112   CL 108* 10/21/2012 0900   CL 103 09/27/2012 1112   CO2 22 10/21/2012 0900   CO2 22 09/27/2012 1112   BUN 9.9 10/21/2012 0900   BUN 14 09/27/2012 1112   CREATININE 0.9 10/21/2012 0900   CREATININE 0.63 09/27/2012 1112      Component Value Date/Time   CALCIUM 9.4 10/21/2012 0900   CALCIUM 8.5 09/27/2012 1112   ALKPHOS 66 10/21/2012 0900   ALKPHOS 55 09/22/2012 0411   AST 18 10/21/2012 0900   AST 67* 09/22/2012 0411   ALT 17 10/21/2012 0900   ALT 46 09/22/2012 0411   BILITOT 0.59 10/21/2012 0900   BILITOT 0.6 09/22/2012 0411       Studies/Results: Dg Chest 2 View  10/09/2012  *RADIOLOGY REPORT*  Clinical Data: Multiple myeloma  CHEST - 2 VIEW  Comparison: September 25, 2012.  Findings: Cardiomediastinal silhouette appears normal.  Right-sided PICC line identified on prior exam has been removed.  Patchy alveolar opacities seen bilaterally on prior exam are significantly  improved, with only residual density remaining in both lung bases. No pneumothorax or pleural effusion is noted.  IMPRESSION: Significantly improved bilateral patchy alveolar opacities compared to prior exam consistent with improving pneumonia or edema; residual densities remain in both lung bases.   Original Report Authenticated By: Lupita Raider.,  M.D.     Medications: I have reviewed the patient's current medications.  Assessment/Plan:  1. Multiple myeloma-status post high-dose chemotherapy with autologous stem cell support in November of 2004. The sero monoclonal protein was slowly rising and a restaging bone marrow biopsy in November 2009 confirmed 10 to 20% plasma cells. The serum light chains were elevated 07/25/2008. He completed 12 cycles of salvage therapy with Revlimid/Decadron with the last cycle initiated 06/16/2009. On 07/13/2009 the serum free light chains were normal and a serum protein electrophoresis/immunofixation revealed no monoclonal protein. Elevated serum M spike and serum free kappa light chains March 2013. He began cycle 1 of Revlimid 25 mg daily x21 days followed by a 7 day break on 09/27/2011. He began a second cycle of salvage Revlimid beginning on 10/25/2011. The serum M spike was stable and the serum free kappa light chains were slightly more elevated on 11/19/2011. He began another cycle of Revlimid/Decadron on 11/22/2011. Velcade was added beginning on 11/29/2011. He completed  3 cycles of Revlimid/Velcade/Decadron with improvement in the serum M spike/serum free kappa light chains. The last cycle was initiated on 01/20/2012. He received melphalan 200 mg per meter squared on 03/17/2012 with autologous stem cell support on 03/18/2012. Serum immunofixation confirmed a monoclonal IgG kappa protein, too low to quantify and a mild elevation of the serum free kappa light chains on 05/19/2012. He began maintenance Revlimid on 07/11/2012. Serum M spike 0.25, serum free kappa light  chains 3.04 on 08/07/2012. Revlimid was placed on hold during the 09/16/2012 hospital admission. Serum M spike 0.45 and serum free kappa light chains 1.83 on 10/21/2012. 2. A serum immunofixation confirmed a monoclonal protein and the serum Kappa and Lambda light chains were mildly elevated in June of 2012.  3. History of elevated liver enzymes. 4. Right lung pneumonia June, 2009 -- resolved after treatment with Bactrim and azithromycin. 5. 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 He continues to have intermittent numbness in the feet. 6. History of hypokalemia -he is no longer taking a potassium supplement 7. Pneumonia 01/06/2009 -- requiring hospitalization 01/10/2011 -- status post broad spectrum antibiotic therapy with clinical resolution. 8. Upper respiratory infection in February, 2011 -- likely a viral URI.  9. Right shoulder/upper arm pain. Plain x-ray of the right shoulder/humerus on 10/16/2011 showed 2 small lytic lesions at the proximal humeral shaft suspicious for myeloma involvement. MRI of the right shoulder 10/27/2011 confirmed multiple myelomatous lesions involving the proximal right humerus, scapula, and ribs. A large destructive lesion was noted in the scapular body with an associated soft tissue component. He completed palliative radiation to the right scapula on 11/19/2011 with partial improvement in the shoulder discomfort. 10. "Hemorrhoid" discomfort and urinary hesitancy on 11/15/2011. He appeared to have an ulcer or fissure at the anal verge. Symptoms have resolved. 11. Fever/sepsis/bacteremia/question pneumonia September 2013 following stem cell therapy. 12. Admission with pneumonia 09/16/2012-influenza A was confirmed on a bronchoscopy03/04/2013, he was treated with Tamiflu. 13. Thrombocytopenia-present on hospital admission 09/16/2012-likely related to the recent infection. The thrombocytopenia could also be related to  Revlimid, antibiotics, or progression of the multiple myeloma. The platelet count is better. 14. Abundant atypical squamous cells on the bronchial washing cytology from 09/21/2012-most likely secondary to reactive changes, no clinical history to suggest a diagnosis of non-small cell lung cancer. 15. Persistent severe anemia-? related to the recent infection, myelodysplasia, or progression of the myeloma. The hemoglobin is better. 16. Pain right upper arm.  Disposition-Chad [name] continues to appear improved. Blood counts are better. Dr. Truett Perna recommends resuming Revlimid. We are referring him for a plain x-ray of the right humerus today to evaluate the right upper arm pain. He will return to the office this afternoon for Zometa. He will return for a lab visit in 2 weeks and a followup visit in 4 weeks at which time we will repeat the serum protein electrophoresis and serum light chains.  Patient seen with Dr. Truett Perna.  Lonna Cobb ANP/GNP-BC

## 2012-11-04 NOTE — Patient Instructions (Addendum)
[facility] Discharge Instructions for Patients Receiving Chemotherapy  Today you received the following chemotherapy agents :Zometa To help prevent nausea and vomiting after your treatment, we encourage you to take your nausea medication as often as prescribed.   If you develop nausea and vomiting that is not controlled by your nausea medication, call the clinic.  BELOW ARE SYMPTOMS THAT SHOULD BE REPORTED IMMEDIATELY:  *FEVER GREATER THAN 100.5 F  *CHILLS WITH OR WITHOUT FEVER  NAUSEA AND VOMITING THAT IS NOT CONTROLLED WITH YOUR NAUSEA MEDICATION  *UNUSUAL SHORTNESS OF BREATH  *UNUSUAL BRUISING OR BLEEDING  TENDERNESS IN MOUTH AND THROAT WITH OR WITHOUT PRESENCE OF ULCERS  *URINARY PROBLEMS  *BOWEL PROBLEMS  UNUSUAL RASH Items with * indicate a potential emergency and should be followed up as soon as possible.   The clinic phone number is [phone].

## 2012-11-05 ENCOUNTER — Telehealth: Payer: Self-pay | Admitting: Nurse Practitioner

## 2012-11-05 ENCOUNTER — Encounter: Payer: Self-pay | Admitting: Internal Medicine

## 2012-11-05 NOTE — Telephone Encounter (Signed)
Notified Chad [name] of right humerus x-ray result from 11/04/2012. He knows to contact the office with worsening pain and we will proceed with additional radiographic evaluation.

## 2012-11-18 ENCOUNTER — Other Ambulatory Visit (HOSPITAL_BASED_OUTPATIENT_CLINIC_OR_DEPARTMENT_OTHER): Payer: BC Managed Care – PPO

## 2012-11-18 DIAGNOSIS — C9000 Multiple myeloma not having achieved remission: Secondary | ICD-10-CM

## 2012-11-18 LAB — CBC WITH DIFFERENTIAL/PLATELET
Basophils Absolute: 0 10*3/uL (ref 0.0–0.1)
EOS%: 5.8 % (ref 0.0–7.0)
Eosinophils Absolute: 0.2 10*3/uL (ref 0.0–0.5)
HCT: 31.4 % — ABNORMAL LOW (ref 38.4–49.9)
HGB: 11.2 g/dL — ABNORMAL LOW (ref 13.0–17.1)
MCH: 34.9 pg — ABNORMAL HIGH (ref 27.2–33.4)
MCV: 97.8 fL (ref 79.3–98.0)
MONO%: 10 % (ref 0.0–14.0)
NEUT#: 2.1 10*3/uL (ref 1.5–6.5)
NEUT%: 58.9 % (ref 39.0–75.0)
RDW: 14.9 % — ABNORMAL HIGH (ref 11.0–14.6)

## 2012-11-23 ENCOUNTER — Telehealth: Payer: Self-pay | Admitting: *Deleted

## 2012-11-23 DIAGNOSIS — C9000 Multiple myeloma not having achieved remission: Secondary | ICD-10-CM

## 2012-11-23 MED ORDER — LENALIDOMIDE 10 MG PO CAPS: 10.0000 mg | ORAL_CAPSULE | Freq: Every day | ORAL | Status: DC

## 2012-11-23 NOTE — Telephone Encounter (Signed)
Notified wife that Revlimid has been refilled and Donielle will need to do his patient survey with [employer].

## 2012-11-25 ENCOUNTER — Telehealth: Payer: Self-pay | Admitting: *Deleted

## 2012-11-25 NOTE — Telephone Encounter (Signed)
Call from [facility] pharmacy to clarify Revlimid cycling. Per Dr. Truett Perna: 10 mg daily, continuous.

## 2012-12-01 ENCOUNTER — Telehealth: Payer: Self-pay | Admitting: Oncology

## 2012-12-01 ENCOUNTER — Ambulatory Visit (HOSPITAL_BASED_OUTPATIENT_CLINIC_OR_DEPARTMENT_OTHER): Payer: BC Managed Care – PPO | Admitting: Oncology

## 2012-12-01 ENCOUNTER — Other Ambulatory Visit (HOSPITAL_BASED_OUTPATIENT_CLINIC_OR_DEPARTMENT_OTHER): Payer: BC Managed Care – PPO | Admitting: Lab

## 2012-12-01 VITALS — BP 135/82 | HR 72 | Temp 98.3°F | Resp 18 | Ht 72.0 in | Wt 204.7 lb

## 2012-12-01 DIAGNOSIS — C9000 Multiple myeloma not having achieved remission: Secondary | ICD-10-CM

## 2012-12-01 LAB — COMPREHENSIVE METABOLIC PANEL (CC13)
BUN: 8.9 mg/dL (ref 7.0–26.0)
CO2: 23 mEq/L (ref 22–29)
Calcium: 8.3 mg/dL — ABNORMAL LOW (ref 8.4–10.4)
Chloride: 108 mEq/L — ABNORMAL HIGH (ref 98–107)
Creatinine: 0.9 mg/dL (ref 0.7–1.3)
Glucose: 95 mg/dl (ref 70–99)

## 2012-12-01 LAB — CBC WITH DIFFERENTIAL/PLATELET
Basophils Absolute: 0.1 10*3/uL (ref 0.0–0.1)
HCT: 35.1 % — ABNORMAL LOW (ref 38.4–49.9)
HGB: 12.5 g/dL — ABNORMAL LOW (ref 13.0–17.1)
LYMPH%: 26.8 % (ref 14.0–49.0)
MONO#: 0.3 10*3/uL (ref 0.1–0.9)
NEUT%: 52.1 % (ref 39.0–75.0)
Platelets: 148 10*3/uL (ref 140–400)
WBC: 3.1 10*3/uL — ABNORMAL LOW (ref 4.0–10.3)
lymph#: 0.8 10*3/uL — ABNORMAL LOW (ref 0.9–3.3)

## 2012-12-01 NOTE — Telephone Encounter (Signed)
Gave pt appt for June 2014 lab and MD °

## 2012-12-01 NOTE — Progress Notes (Signed)
[facility]    OFFICE PROGRESS NOTE   INTERVAL HISTORY:   Chad [name] returns as scheduled. He reports an improved energy level. He continues daily Revlimid. Stable mild neuropathy symptoms in the feet. He continues to have pain at the right arm with abduction and posterior extension. No pain at the shoulder joint. He is not taking pain medication.  Objective:  Vital signs in last 24 hours:  Blood pressure 135/82, pulse 72, temperature 98.3 F (36.8 C), temperature source Oral, resp. rate 18, height 6' (1.829 m), weight 204 lb 11.2 oz (92.851 kg).    HEENT: No thrush or ulcers Resp: Lungs clear bilaterally Cardio: Regular rate and rhythm GI: No hepatosplenomegaly Vascular: No leg edema Musculoskeletal: Right arm without mass or tenderness. Discomfort with abduction at the right shoulder and extension at the right arm. The pain is in the right upper arm      Lab Results:  Lab Results  Component Value Date   WBC 3.1* 12/01/2012   HGB 12.5* 12/01/2012   HCT 35.1* 12/01/2012   MCV 99.8* 12/01/2012   PLT 148 12/01/2012   ANC 1.6  Potassium 3.5, creatinine 0.9, alkaline phosphatase 48    Medications: I have reviewed the patient's current medications.  Assessment/Plan: 1. Multiple myeloma-status post high-dose chemotherapy with autologous stem cell support in November of 2004. The sero monoclonal protein was slowly rising and a restaging bone marrow biopsy in November 2009 confirmed 10 to 20% plasma cells. The serum light chains were elevated 07/25/2008. He completed 12 cycles of salvage therapy with Revlimid/Decadron with the last cycle initiated 06/16/2009. On 07/13/2009 the serum free light chains were normal and a serum protein electrophoresis/immunofixation revealed no monoclonal protein. Elevated serum M spike and serum free kappa light chains March 2013. He began cycle 1 of Revlimid 25 mg daily x21 days followed by a 7 day break on 09/27/2011. He began a second  cycle of salvage Revlimid beginning on 10/25/2011. The serum M spike was stable and the serum free kappa light chains were slightly more elevated on 11/19/2011. He began another cycle of Revlimid/Decadron on 11/22/2011. Velcade was added beginning on 11/29/2011. He completed 3 cycles of Revlimid/Velcade/Decadron with improvement in the serum M spike/serum free kappa light chains. The last cycle was initiated on 01/20/2012. He received melphalan 200 mg per meter squared on 03/17/2012 with autologous stem cell support on 03/18/2012. Serum immunofixation confirmed a monoclonal IgG kappa protein, too low to quantify and a mild elevation of the serum free kappa light chains on 05/19/2012. He began maintenance Revlimid on 07/11/2012. Serum M spike 0.25, serum free kappa light chains 3.04 on 08/07/2012. Revlimid was placed on hold during the 09/16/2012 hospital admission. Serum M spike 0.45 and serum free kappa light chains 1.83 on 10/21/2012. Revlimid was resumed 11/04/2012 2. A serum immunofixation confirmed a monoclonal protein and the serum Kappa and Lambda light chains were mildly elevated in June of 2012.  3. History of elevated liver enzymes. 4. Right lung pneumonia June, 2009 -- resolved after treatment with Bactrim and azithromycin. 5. 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 He continues to have intermittent numbness in the feet. 6. History of hypokalemia -he is no longer taking a potassium supplement 7. Pneumonia 01/06/2009 -- requiring hospitalization 01/10/2011 -- status post broad spectrum antibiotic therapy with clinical resolution. 8. Upper respiratory infection in February, 2011 -- likely a viral URI.  9. Right shoulder/upper arm pain. Plain x-ray  of the right shoulder/humerus on 10/16/2011 showed 2 small lytic lesions at the proximal humeral shaft suspicious for myeloma involvement. MRI of the right shoulder 10/27/2011 confirmed multiple  myelomatous lesions involving the proximal right humerus, scapula, and ribs. A large destructive lesion was noted in the scapular body with an associated soft tissue component. He completed palliative radiation to the right scapula on 11/19/2011 with partial improvement in the shoulder discomfort. 10. "Hemorrhoid" discomfort and urinary hesitancy on 11/15/2011. He appeared to have an ulcer or fissure at the anal verge. Symptoms have resolved. 11. Fever/sepsis/bacteremia/question pneumonia September 2013 following stem cell therapy. 12. Admission with pneumonia 09/16/2012-influenza A was confirmed on a bronchoscopy03/04/2013, he was treated with Tamiflu. 13. Thrombocytopenia-present on hospital admission 09/16/2012-likely related to the recent infection. The thrombocytopenia could also be related to Revlimid, antibiotics, or progression of the multiple myeloma. The platelet count has improved 14. Abundant atypical squamous cells on the bronchial washing cytology from 09/21/2012-most likely secondary to reactive changes, no clinical history to suggest a diagnosis of non-small cell lung cancer. 15. Pain right upper arm. Plain x-ray 11/04/2012 with a 6 mm lytic lesion in the proximal humeral shaft. No fracture or dislocation. The pain persist. We will make an orthopedic referral.  Disposition:  He appears to be tolerating the Revlimid well. The pancytopenia has improved. We will followup on the serum protein electrophoresis and serum light chain analysis from today.  Chad [name] continues to have pain with movement at the right arm. He underwent radiation to a destructive lesion at the right scapula in the spring of 2013. An MRI on 10/27/2011 confirmed myeloma lesions involving the proximal humerus, scapula, and ribs. A destructive lesion was noted in the scapula with associated soft tissue component. I doubt his current pain is related to the 6 mm right humerus lesion.  The pain is most likely related to  multiple myeloma, though he could have a benign musculoskeletal condition such as a rotator cuff tear. We will make an orthopedic referral for an opinion regarding the etiology of his pain. I will defer the need for additional imaging to orthopedics. He may be a candidate for additional radiation if the pain is felt to be related to multiple myeloma.  Chad [name] will return for an office visit in one month. He continues daily Revlimid.   Chad Papas, MD  12/01/2012  9:41 AM

## 2012-12-03 LAB — PROTEIN ELECTROPHORESIS, SERUM
Albumin ELP: 58.7 % (ref 55.8–66.1)
Alpha-1-Globulin: 4.2 % (ref 2.9–4.9)
Alpha-2-Globulin: 11.2 % (ref 7.1–11.8)
Beta 2: 4.8 % (ref 3.2–6.5)
Beta Globulin: 6.1 % (ref 4.7–7.2)
Gamma Globulin: 15 % (ref 11.1–18.8)
M-Spike, %: 0.32 g/dL
Total Protein, Serum Electrophoresis: 7.1 g/dL (ref 6.0–8.3)

## 2012-12-03 LAB — KAPPA/LAMBDA LIGHT CHAINS
Kappa free light chain: 2.24 mg/dL — ABNORMAL HIGH (ref 0.33–1.94)
Kappa:Lambda Ratio: 0.71 (ref 0.26–1.65)
Lambda Free Lght Chn: 3.17 mg/dL — ABNORMAL HIGH (ref 0.57–2.63)

## 2012-12-04 ENCOUNTER — Telehealth: Payer: Self-pay | Admitting: *Deleted

## 2012-12-04 NOTE — Telephone Encounter (Signed)
Notified patient of m-spike and light chain results-stable.

## 2012-12-04 NOTE — Telephone Encounter (Signed)
Message copied by Wandalee Ferdinand on Fri Dec 04, 2012  5:43 PM ------      Message from: Chad [name]      Created: Thu Dec 03, 2012  9:45 PM       Please call patient, light chains and m-spike are stable, f/u as scheduled ------

## 2012-12-10 ENCOUNTER — Other Ambulatory Visit: Payer: Self-pay | Admitting: Oncology

## 2012-12-10 DIAGNOSIS — C9000 Multiple myeloma not having achieved remission: Secondary | ICD-10-CM

## 2012-12-14 ENCOUNTER — Telehealth: Payer: Self-pay | Admitting: *Deleted

## 2012-12-14 NOTE — Telephone Encounter (Signed)
Patient called inquire about status of his ortho surgeon referral MD ordered on 12/01/12 related to continued pain right humerus. Does not appear to have been done yet. Called scheduler, Haydee Monica and brought her attention to this and requested it be done asap. Called Tu and made him aware of situation. He was appreciative of return call.

## 2012-12-15 ENCOUNTER — Telehealth: Payer: Self-pay | Admitting: Oncology

## 2012-12-15 NOTE — Telephone Encounter (Signed)
gave referral to HIM to fax record, called [facility] orthopedics left message, will continue to follow up

## 2012-12-16 ENCOUNTER — Telehealth: Payer: Self-pay | Admitting: Oncology

## 2012-12-16 NOTE — Telephone Encounter (Signed)
[facility] to check on appt, HIM faxed the wrong chart, went to HIM chart is being refaxed will call again today

## 2012-12-16 NOTE — Telephone Encounter (Signed)
Faxed pt medical records to [facility]

## 2012-12-17 ENCOUNTER — Telehealth: Payer: Self-pay | Admitting: Oncology

## 2012-12-17 ENCOUNTER — Other Ambulatory Visit: Payer: Self-pay | Admitting: *Deleted

## 2012-12-17 DIAGNOSIS — C9000 Multiple myeloma not having achieved remission: Secondary | ICD-10-CM

## 2012-12-17 MED ORDER — LENALIDOMIDE 10 MG PO CAPS: 10.0000 mg | ORAL_CAPSULE | Freq: Every day | ORAL | Status: DC

## 2012-12-17 NOTE — Telephone Encounter (Signed)
Talked to pt's wife and pt has appt with Dr. Darol Destine PA on 12/21/12

## 2012-12-28 ENCOUNTER — Telehealth: Payer: Self-pay | Admitting: *Deleted

## 2012-12-28 ENCOUNTER — Telehealth: Payer: Self-pay | Admitting: Oncology

## 2012-12-28 ENCOUNTER — Ambulatory Visit (HOSPITAL_BASED_OUTPATIENT_CLINIC_OR_DEPARTMENT_OTHER): Payer: BC Managed Care – PPO | Admitting: Oncology

## 2012-12-28 ENCOUNTER — Ambulatory Visit: Payer: BC Managed Care – PPO

## 2012-12-28 ENCOUNTER — Other Ambulatory Visit (HOSPITAL_BASED_OUTPATIENT_CLINIC_OR_DEPARTMENT_OTHER): Payer: BC Managed Care – PPO | Admitting: Lab

## 2012-12-28 VITALS — BP 135/84 | HR 71 | Temp 98.2°F | Resp 18 | Ht 72.0 in | Wt 202.2 lb

## 2012-12-28 DIAGNOSIS — C9000 Multiple myeloma not having achieved remission: Secondary | ICD-10-CM

## 2012-12-28 LAB — CBC WITH DIFFERENTIAL/PLATELET
BASO%: 3.2 % — ABNORMAL HIGH (ref 0.0–2.0)
Eosinophils Absolute: 0.2 10*3/uL (ref 0.0–0.5)
HCT: 36.2 % — ABNORMAL LOW (ref 38.4–49.9)
HGB: 13 g/dL (ref 13.0–17.1)
MCHC: 35.9 g/dL (ref 32.0–36.0)
MONO#: 0.3 10*3/uL (ref 0.1–0.9)
NEUT#: 1.7 10*3/uL (ref 1.5–6.5)
NEUT%: 54.2 % (ref 39.0–75.0)
WBC: 3.2 10*3/uL — ABNORMAL LOW (ref 4.0–10.3)
lymph#: 0.8 10*3/uL — ABNORMAL LOW (ref 0.9–3.3)

## 2012-12-28 LAB — COMPREHENSIVE METABOLIC PANEL (CC13)
ALT: 24 U/L (ref 0–55)
AST: 27 U/L (ref 5–34)
CO2: 21 mEq/L — ABNORMAL LOW (ref 22–29)
Chloride: 107 mEq/L (ref 98–107)
Sodium: 140 mEq/L (ref 136–145)
Total Bilirubin: 0.86 mg/dL (ref 0.20–1.20)
Total Protein: 7.2 g/dL (ref 6.4–8.3)

## 2012-12-28 NOTE — Telephone Encounter (Signed)
gv and printed appt sched and avs for pt....MW added tx   °

## 2012-12-28 NOTE — Telephone Encounter (Signed)
Per staff message and POF I have scheduled appts.  JMW  

## 2012-12-28 NOTE — Progress Notes (Signed)
[facility]    OFFICE PROGRESS NOTE   INTERVAL HISTORY:   He returns as scheduled. He continues daily Revlimid. Mild tingling in the feet and cramps in the toes. He saw orthopedics and has been diagnosed with adhesive capsulitis of the right shoulder. He received a steroid injection on 12/21/2012 and began a physical therapy program. He has noted some improvement in the range of motion at the right shoulder. No pain at rest.  Objective:  Vital signs in last 24 hours:  Blood pressure 135/84, pulse 71, temperature 98.2 F (36.8 C), temperature source Oral, resp. rate 18, height 6' (1.829 m), weight 202 lb 3.2 oz (91.717 kg).    HEENT: No thrush Resp: Lungs clear bilaterally Cardio: Regular rate and rhythm GI: No hepatosplenomegaly Vascular: No leg edema Musculoskeletal: Decreased range of motion with abduction at the right shoulder  Skin:     Lab Results:  Lab Results  Component Value Date   WBC 3.2* 12/28/2012   HGB 13.0 12/28/2012   HCT 36.2* 12/28/2012   MCV 99.0* 12/28/2012   PLT 132* 12/28/2012   ANC 1.7     Medications: I have reviewed the patient's current medications.  Assessment/Plan: 1. Multiple myeloma-status post high-dose chemotherapy with autologous stem cell support in November of 2004. The sero monoclonal protein was slowly rising and a restaging bone marrow biopsy in November 2009 confirmed 10 to 20% plasma cells. The serum light chains were elevated 07/25/2008. He completed 12 cycles of salvage therapy with Revlimid/Decadron with the last cycle initiated 06/16/2009. On 07/13/2009 the serum free light chains were normal and a serum protein electrophoresis/immunofixation revealed no monoclonal protein. Elevated serum M spike and serum free kappa light chains March 2013. He began cycle 1 of Revlimid 25 mg daily x21 days followed by a 7 day break on 09/27/2011. He began a second cycle of salvage Revlimid beginning on 10/25/2011. The serum M spike  was stable and the serum free kappa light chains were slightly more elevated on 11/19/2011. He began another cycle of Revlimid/Decadron on 11/22/2011. Velcade was added beginning on 11/29/2011. He completed 3 cycles of Revlimid/Velcade/Decadron with improvement in the serum M spike/serum free kappa light chains. The last cycle was initiated on 01/20/2012. He received melphalan 200 mg per meter squared on 03/17/2012 with autologous stem cell support on 03/18/2012. Serum immunofixation confirmed a monoclonal IgG kappa protein, too low to quantify and a mild elevation of the serum free kappa light chains on 05/19/2012. He began maintenance Revlimid on 07/11/2012. Serum M spike 0.25, serum free kappa light chains 3.04 on 08/07/2012. Revlimid was placed on hold during the 09/16/2012 hospital admission. Serum M spike 0.45 and serum free kappa light chains 1.83 on 10/21/2012. Revlimid was resumed 11/04/2012. The serum M spike and kappa light chains were stable on 12/01/2012.  2. A serum immunofixation confirmed a monoclonal protein and the serum Kappa and Lambda light chains were mildly elevated in June of 2012  3. History of elevated liver enzymes. 4. Right lung pneumonia June, 2009 -- resolved after treatment with Bactrim and azithromycin. 5. 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 He continues to have intermittent numbness in the feet. 6. History of hypokalemia -he is no longer taking a potassium supplement 7. Pneumonia 01/06/2009 -- requiring hospitalization 01/10/2011 -- status post broad spectrum antibiotic therapy with clinical resolution. 8. Upper respiratory infection in February, 2011 -- likely a viral URI.  9. Right shoulder/upper arm  pain. Plain x-ray of the right shoulder/humerus on 10/16/2011 showed 2 small lytic lesions at the proximal humeral shaft suspicious for myeloma involvement. MRI of the right shoulder 10/27/2011 confirmed multiple  myelomatous lesions involving the proximal right humerus, scapula, and ribs. A large destructive lesion was noted in the scapular body with an associated soft tissue component. He completed palliative radiation to the right scapula on 11/19/2011 with partial improvement in the shoulder discomfort. 10. "Hemorrhoid" discomfort and urinary hesitancy on 11/15/2011. He appeared to have an ulcer or fissure at the anal verge. Symptoms have resolved. 11. Fever/sepsis/bacteremia/question pneumonia September 2013 following stem cell therapy. 12. Admission with pneumonia 09/16/2012-influenza A was confirmed on a bronchoscopy03/04/2013, he was treated with Tamiflu. 13. Thrombocytopenia-present on hospital admission 09/16/2012-likely related to the recent infection. The thrombocytopenia could also be related to Revlimid, antibiotics, or progression of the multiple myeloma. The platelet count has improved 14. Abundant atypical squamous cells on the bronchial washing cytology from 09/21/2012-most likely secondary to reactive changes, no clinical history to suggest a diagnosis of non-small cell lung cancer. 15. Pain right upper arm. Plain x-ray 11/04/2012 with a 6 mm lytic lesion in the proximal humeral shaft. No fracture or dislocation. He is being evaluated by orthopedics.?adhesive capsulitis  Disposition:  He appears stable.  He will continue revlimid. He will return for zometa on 7/29.  He is scheduled for an office visit on 8/22.  We will f/u on the SPEP and light chains from today.   Thornton Papas, MD  12/28/2012  1:39 PM

## 2012-12-30 ENCOUNTER — Telehealth: Payer: Self-pay | Admitting: *Deleted

## 2012-12-30 LAB — SPEP & IFE WITH QIG
Alpha-1-Globulin: 4.4 % (ref 2.9–4.9)
Gamma Globulin: 13.1 % (ref 11.1–18.8)
IgM, Serum: 30 mg/dL — ABNORMAL LOW (ref 41–251)
M-Spike, %: 0.36 g/dL

## 2012-12-30 LAB — KAPPA/LAMBDA LIGHT CHAINS
Kappa:Lambda Ratio: 0.66 (ref 0.26–1.65)
Lambda Free Lght Chn: 2.12 mg/dL (ref 0.57–2.63)

## 2012-12-30 NOTE — Telephone Encounter (Signed)
Patient called to ask if Dr. Truett Perna can get MRI results from ortho office and share with him. The PA at office would not release results to him and office won't give results over phone. He has been instructed to wait until his appointment on 01/11/13.

## 2013-01-01 ENCOUNTER — Telehealth: Payer: Self-pay | Admitting: *Deleted

## 2013-01-01 NOTE — Telephone Encounter (Signed)
Referred to Dr. Rennis Chris by Dr. Truett Perna. Left VM requesting copy of MRI report per Dr. Kalman Drape request.

## 2013-01-08 ENCOUNTER — Telehealth: Payer: Self-pay | Admitting: *Deleted

## 2013-01-08 NOTE — Telephone Encounter (Signed)
Dr. Rennis Chris requesting MRI film from last year of his shoulder. Called Rebecka Apley in radiology and she will have MRI of 10/22/2011 put on CD for wife/patient to pick up this afternoon. Mrs. Burtt notified.

## 2013-01-12 ENCOUNTER — Other Ambulatory Visit: Payer: Self-pay | Admitting: *Deleted

## 2013-01-12 DIAGNOSIS — C9000 Multiple myeloma not having achieved remission: Secondary | ICD-10-CM

## 2013-01-12 MED ORDER — LENALIDOMIDE 10 MG PO CAPS: 10.0000 mg | ORAL_CAPSULE | Freq: Every day | ORAL | Status: DC

## 2013-02-09 ENCOUNTER — Ambulatory Visit: Payer: BC Managed Care – PPO | Admitting: Lab

## 2013-02-09 ENCOUNTER — Other Ambulatory Visit: Payer: Self-pay | Admitting: *Deleted

## 2013-02-09 ENCOUNTER — Ambulatory Visit (HOSPITAL_BASED_OUTPATIENT_CLINIC_OR_DEPARTMENT_OTHER): Payer: BC Managed Care – PPO

## 2013-02-09 ENCOUNTER — Other Ambulatory Visit (HOSPITAL_BASED_OUTPATIENT_CLINIC_OR_DEPARTMENT_OTHER): Payer: BC Managed Care – PPO | Admitting: Lab

## 2013-02-09 VITALS — BP 155/72 | HR 65 | Resp 20

## 2013-02-09 DIAGNOSIS — C9000 Multiple myeloma not having achieved remission: Secondary | ICD-10-CM

## 2013-02-09 LAB — CBC WITH DIFFERENTIAL/PLATELET
BASO%: 2.9 % — ABNORMAL HIGH (ref 0.0–2.0)
EOS%: 10.3 % — ABNORMAL HIGH (ref 0.0–7.0)
LYMPH%: 29.6 % (ref 14.0–49.0)
MCH: 33.2 pg (ref 27.2–33.4)
MCHC: 34.9 g/dL (ref 32.0–36.0)
MONO#: 0.3 10*3/uL (ref 0.1–0.9)
Platelets: 121 10*3/uL — ABNORMAL LOW (ref 140–400)
RBC: 3.74 10*6/uL — ABNORMAL LOW (ref 4.20–5.82)
WBC: 3.1 10*3/uL — ABNORMAL LOW (ref 4.0–10.3)
lymph#: 0.9 10*3/uL (ref 0.9–3.3)

## 2013-02-09 LAB — BASIC METABOLIC PANEL (CC13)
BUN: 10.9 mg/dL (ref 7.0–26.0)
Calcium: 9.3 mg/dL (ref 8.4–10.4)
Creatinine: 0.9 mg/dL (ref 0.7–1.3)
Glucose: 109 mg/dl (ref 70–140)
Potassium: 3.9 mEq/L (ref 3.5–5.1)

## 2013-02-09 MED ORDER — ZOLEDRONIC ACID 4 MG/100ML IV SOLN
4.0000 mg | Freq: Once | INTRAVENOUS | Status: AC
  Administered 2013-02-09: 4 mg via INTRAVENOUS
  Filled 2013-02-09: qty 100

## 2013-02-09 MED ORDER — SODIUM CHLORIDE 0.9 % IV SOLN
Freq: Once | INTRAVENOUS | Status: AC
  Administered 2013-02-09: 10:00:00 via INTRAVENOUS

## 2013-02-09 NOTE — Telephone Encounter (Signed)
Refill request for Revlimid to MD desk for approval. 

## 2013-02-09 NOTE — Patient Instructions (Addendum)
Zoledronic Acid injection (Hypercalcemia, Oncology) What is this medicine? ZOLEDRONIC ACID (ZOE le dron ik AS id) lowers the amount of calcium loss from bone. It is used to treat too much calcium in your blood from cancer. It is also used to prevent complications of cancer that has spread to the bone. This medicine may be used for other purposes; ask your health care provider or pharmacist if you have questions. What should I tell my health care provider before I take this medicine? They need to know if you have any of these conditions: -aspirin-sensitive asthma -dental disease -kidney disease -an unusual or allergic reaction to zoledronic acid, other medicines, foods, dyes, or preservatives -pregnant or trying to get pregnant -breast-feeding How should I use this medicine? This medicine is for infusion into a vein. It is given by a health care professional in a hospital or clinic setting. Talk to your pediatrician regarding the use of this medicine in children. Special care may be needed. Overdosage: If you think you have taken too much of this medicine contact a poison control center or emergency room at once. NOTE: This medicine is only for you. Do not share this medicine with others. What if I miss a dose? It is important not to miss your dose. Call your doctor or health care professional if you are unable to keep an appointment. What may interact with this medicine? -certain antibiotics given by injection -NSAIDs, medicines for pain and inflammation, like ibuprofen or naproxen -some diuretics like bumetanide, furosemide -teriparatide -thalidomide This list may not describe all possible interactions. Give your health care provider a list of all the medicines, herbs, non-prescription drugs, or dietary supplements you use. Also tell them if you smoke, drink alcohol, or use illegal drugs. Some items may interact with your medicine. What should I watch for while using this medicine? Visit  your doctor or health care professional for regular checkups. It may be some time before you see the benefit from this medicine. Do not stop taking your medicine unless your doctor tells you to. Your doctor may order blood tests or other tests to see how you are doing. Women should inform their doctor if they wish to become pregnant or think they might be pregnant. There is a potential for serious side effects to an unborn child. Talk to your health care professional or pharmacist for more information. You should make sure that you get enough calcium and vitamin D while you are taking this medicine. Discuss the foods you eat and the vitamins you take with your health care professional. Some people who take this medicine have severe bone, joint, and/or muscle pain. This medicine may also increase your risk for a broken thigh bone. Tell your doctor right away if you have pain in your upper leg or groin. Tell your doctor if you have any pain that does not go away or that gets worse. What side effects may I notice from receiving this medicine? Side effects that you should report to your doctor or health care professional as soon as possible: -allergic reactions like skin rash, itching or hives, swelling of the face, lips, or tongue -anxiety, confusion, or depression -breathing problems -changes in vision -feeling faint or lightheaded, falls -jaw burning, cramping, pain -muscle cramps, stiffness, or weakness -trouble passing urine or change in the amount of urine Side effects that usually do not require medical attention (report to your doctor or health care professional if they continue or are bothersome): -bone, joint, or muscle pain -  fever -hair loss -irritation at site where injected -loss of appetite -nausea, vomiting -stomach upset -tired This list may not describe all possible side effects. Call your doctor for medical advice about side effects. You may report side effects to FDA at  1-800-FDA-1088. Where should I keep my medicine? This drug is given in a hospital or clinic and will not be stored at home. NOTE: This sheet is a summary. It may not cover all possible information. If you have questions about this medicine, talk to your doctor, pharmacist, or health care provider.  2013, [employer]. (12/28/2010 9:06:58 AM)  

## 2013-02-11 LAB — PROTEIN ELECTROPHORESIS, SERUM
Alpha-2-Globulin: 12.6 % — ABNORMAL HIGH (ref 7.1–11.8)
M-Spike, %: 0.26 g/dL
Total Protein, Serum Electrophoresis: 7.1 g/dL (ref 6.0–8.3)

## 2013-02-11 LAB — KAPPA/LAMBDA LIGHT CHAINS
Kappa free light chain: 1.81 mg/dL (ref 0.33–1.94)
Lambda Free Lght Chn: 2.14 mg/dL (ref 0.57–2.63)

## 2013-02-15 ENCOUNTER — Other Ambulatory Visit: Payer: Self-pay | Admitting: *Deleted

## 2013-02-15 DIAGNOSIS — C9000 Multiple myeloma not having achieved remission: Secondary | ICD-10-CM

## 2013-02-15 MED ORDER — LENALIDOMIDE 10 MG PO CAPS: 10.0000 mg | ORAL_CAPSULE | Freq: Every day | ORAL | Status: DC

## 2013-03-05 ENCOUNTER — Other Ambulatory Visit (HOSPITAL_BASED_OUTPATIENT_CLINIC_OR_DEPARTMENT_OTHER): Payer: BC Managed Care – PPO | Admitting: Lab

## 2013-03-05 ENCOUNTER — Telehealth: Payer: Self-pay | Admitting: Oncology

## 2013-03-05 ENCOUNTER — Ambulatory Visit (HOSPITAL_BASED_OUTPATIENT_CLINIC_OR_DEPARTMENT_OTHER): Payer: BC Managed Care – PPO | Admitting: Oncology

## 2013-03-05 VITALS — BP 139/85 | HR 68 | Temp 98.5°F | Resp 20 | Ht 72.0 in | Wt 202.8 lb

## 2013-03-05 DIAGNOSIS — C9000 Multiple myeloma not having achieved remission: Secondary | ICD-10-CM

## 2013-03-05 LAB — BASIC METABOLIC PANEL (CC13)
BUN: 10.5 mg/dL (ref 7.0–26.0)
Calcium: 9 mg/dL (ref 8.4–10.4)
Glucose: 98 mg/dl (ref 70–140)

## 2013-03-05 LAB — CBC WITH DIFFERENTIAL/PLATELET
BASO%: 2.7 % — ABNORMAL HIGH (ref 0.0–2.0)
EOS%: 11.9 % — ABNORMAL HIGH (ref 0.0–7.0)
HCT: 35.8 % — ABNORMAL LOW (ref 38.4–49.9)
LYMPH%: 22.2 % (ref 14.0–49.0)
MCH: 34.5 pg — ABNORMAL HIGH (ref 27.2–33.4)
MCHC: 35 g/dL (ref 32.0–36.0)
NEUT%: 53.7 % (ref 39.0–75.0)
lymph#: 0.8 10*3/uL — ABNORMAL LOW (ref 0.9–3.3)

## 2013-03-05 NOTE — Telephone Encounter (Signed)
Gave pt appt for lab and MD , emailed Marcelino Duster regarding Zometa on October 2014

## 2013-03-06 NOTE — Progress Notes (Signed)
[facility]    OFFICE PROGRESS NOTE   INTERVAL HISTORY:   He returns as scheduled. He continues daily Revlimid. Mild tingling in the feet. Good appetite and energy level. The right shoulder pain has improved. No recent infection.  Objective:  Vital signs in last 24 hours:  Blood pressure 139/85, pulse 68, temperature 98.5 F (36.9 C), temperature source Oral, resp. rate 20, height 6' (1.829 m), weight 202 lb 12.8 oz (91.989 kg).    HEENT: no thrush or ulcers Resp: lungs clear bilaterally Cardio: regular rate and rhythm GI: no hepatosplenomegaly Vascular: no leg edema   Lab Results:  Lab Results  Component Value Date   WBC 3.4* 03/05/2013   HGB 12.6* 03/05/2013   HCT 35.8* 03/05/2013   MCV 98.4* 03/05/2013   PLT 144 03/05/2013  ANC 1.8    Medications: I have reviewed the patient's current medications.  Assessment/Plan: 1. Multiple myeloma-status post high-dose chemotherapy with autologous stem cell support in November of 2004. The sero monoclonal protein was slowly rising and a restaging bone marrow biopsy in November 2009 confirmed 10 to 20% plasma cells. The serum light chains were elevated 07/25/2008. He completed 12 cycles of salvage therapy with Revlimid/Decadron with the last cycle initiated 06/16/2009. On 07/13/2009 the serum free light chains were normal and a serum protein electrophoresis/immunofixation revealed no monoclonal protein. Elevated serum M spike and serum free kappa light chains March 2013. He began cycle 1 of Revlimid 25 mg daily x21 days followed by a 7 day break on 09/27/2011. He began a second cycle of salvage Revlimid beginning on 10/25/2011. The serum M spike was stable and the serum free kappa light chains were slightly more elevated on 11/19/2011. He began another cycle of Revlimid/Decadron on 11/22/2011. Velcade was added beginning on 11/29/2011. He completed 3 cycles of Revlimid/Velcade/Decadron with improvement in the serum M  spike/serum free kappa light chains. The last cycle was initiated on 01/20/2012. He received melphalan 200 mg per meter squared on 03/17/2012 with autologous stem cell support on 03/18/2012. Serum immunofixation confirmed a monoclonal IgG kappa protein, too low to quantify and a mild elevation of the serum free kappa light chains on 05/19/2012. He began maintenance Revlimid on 07/11/2012. Serum M spike 0.25, serum free kappa light chains 3.04 on 08/07/2012. Revlimid was placed on hold during the 09/16/2012 hospital admission. Serum M spike 0.45 and serum free kappa light chains 1.83 on 10/21/2012. Revlimid was resumed 11/04/2012. The serum M spike and kappa light chains were stable on 02/09/2013. 2. A serum immunofixation confirmed a monoclonal protein and the serum Kappa and Lambda light chains were mildly elevated in June of 2012  3. History of elevated liver enzymes. 4. Right lung pneumonia June, 2009 -- resolved after treatment with Bactrim and azithromycin. 5. 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 He continues to have intermittent numbness in the feet. 6. History of hypokalemia -he is no longer taking a potassium supplement 7. Pneumonia 01/06/2009 -- requiring hospitalization 01/10/2011 -- status post broad spectrum antibiotic therapy with clinical resolution. 8. Upper respiratory infection in February, 2011 -- likely a viral URI.  9. Right shoulder/upper arm pain. Plain x-ray of the right shoulder/humerus on 10/16/2011 showed 2 small lytic lesions at the proximal humeral shaft suspicious for myeloma involvement. MRI of the right shoulder 10/27/2011 confirmed multiple myelomatous lesions involving the proximal right humerus, scapula, and ribs. A large destructive lesion was noted in the scapular body with  an associated soft tissue component. He completed palliative radiation to the right scapula on 11/19/2011 with partial improvement in the  shoulder discomfort. 10. "Hemorrhoid" discomfort and urinary hesitancy on 11/15/2011. He appeared to have an ulcer or fissure at the anal verge. Symptoms have resolved. 11. Fever/sepsis/bacteremia/question pneumonia September 2013 following stem cell therapy. 12. Admission with pneumonia 09/16/2012-influenza A was confirmed on a bronchoscopy03/04/2013, he was treated with Tamiflu. 13. Thrombocytopenia-present on hospital admission 09/16/2012-likely related to the recent infection. The thrombocytopenia could also be related to Revlimid, antibiotics, or progression of the multiple myeloma. The platelet count has improved 14. Abundant atypical squamous cells on the bronchial washing cytology from 09/21/2012-most likely secondary to reactive changes, no clinical history to suggest a diagnosis of non-small cell lung cancer.       15. Pain right upper arm. Plain x-ray 11/04/2012 with a 6 mm lytic lesion in the proximal humeral shaft. No fracture or dislocation. Improved following steroid injections and physical therapy.probable adhesive capsulitis     Disposition:  He appears stable. No clinical or laboratory evidence for progression of the myeloma. He will continue daily Revlimid. Mr. Buckalew received Zometa on 02/09/2013. He will return for an office visit and Zometa on 05/03/2013.He is scheduled for an appointment at [facility] next month.   Thornton Papas, MD  03/06/2013  3:28 PM

## 2013-03-08 ENCOUNTER — Telehealth: Payer: Self-pay | Admitting: Oncology

## 2013-03-08 NOTE — Telephone Encounter (Signed)
Gave pt appt for lab, MD and chemo for [facility] 2014

## 2013-03-09 LAB — PROTEIN ELECTROPHORESIS, SERUM: Gamma Globulin: 11.1 % (ref 11.1–18.8)

## 2013-03-09 LAB — KAPPA/LAMBDA LIGHT CHAINS: Kappa free light chain: 1.9 mg/dL (ref 0.33–1.94)

## 2013-03-16 ENCOUNTER — Other Ambulatory Visit: Payer: Self-pay | Admitting: *Deleted

## 2013-03-16 DIAGNOSIS — C9000 Multiple myeloma not having achieved remission: Secondary | ICD-10-CM

## 2013-03-16 MED ORDER — LENALIDOMIDE 10 MG PO CAPS: 10.0000 mg | ORAL_CAPSULE | Freq: Every day | ORAL | Status: DC

## 2013-04-13 ENCOUNTER — Other Ambulatory Visit: Payer: Self-pay | Admitting: *Deleted

## 2013-04-13 DIAGNOSIS — C9000 Multiple myeloma not having achieved remission: Secondary | ICD-10-CM

## 2013-04-13 MED ORDER — LENALIDOMIDE 10 MG PO CAPS: 10.0000 mg | ORAL_CAPSULE | Freq: Every day | ORAL | Status: DC

## 2013-04-14 ENCOUNTER — Telehealth: Payer: Self-pay | Admitting: Pulmonary Disease

## 2013-04-14 NOTE — Telephone Encounter (Signed)
Error  No reason for message

## 2013-04-24 IMAGING — CR DG CHEST 2V
2 series · 2 of 2 positions shown · non-contrast
Comparison: 07/31/2012

CLINICAL DATA: Fever and cough.  Head congestion.

CHEST - 2 VIEW

[w chest pa]
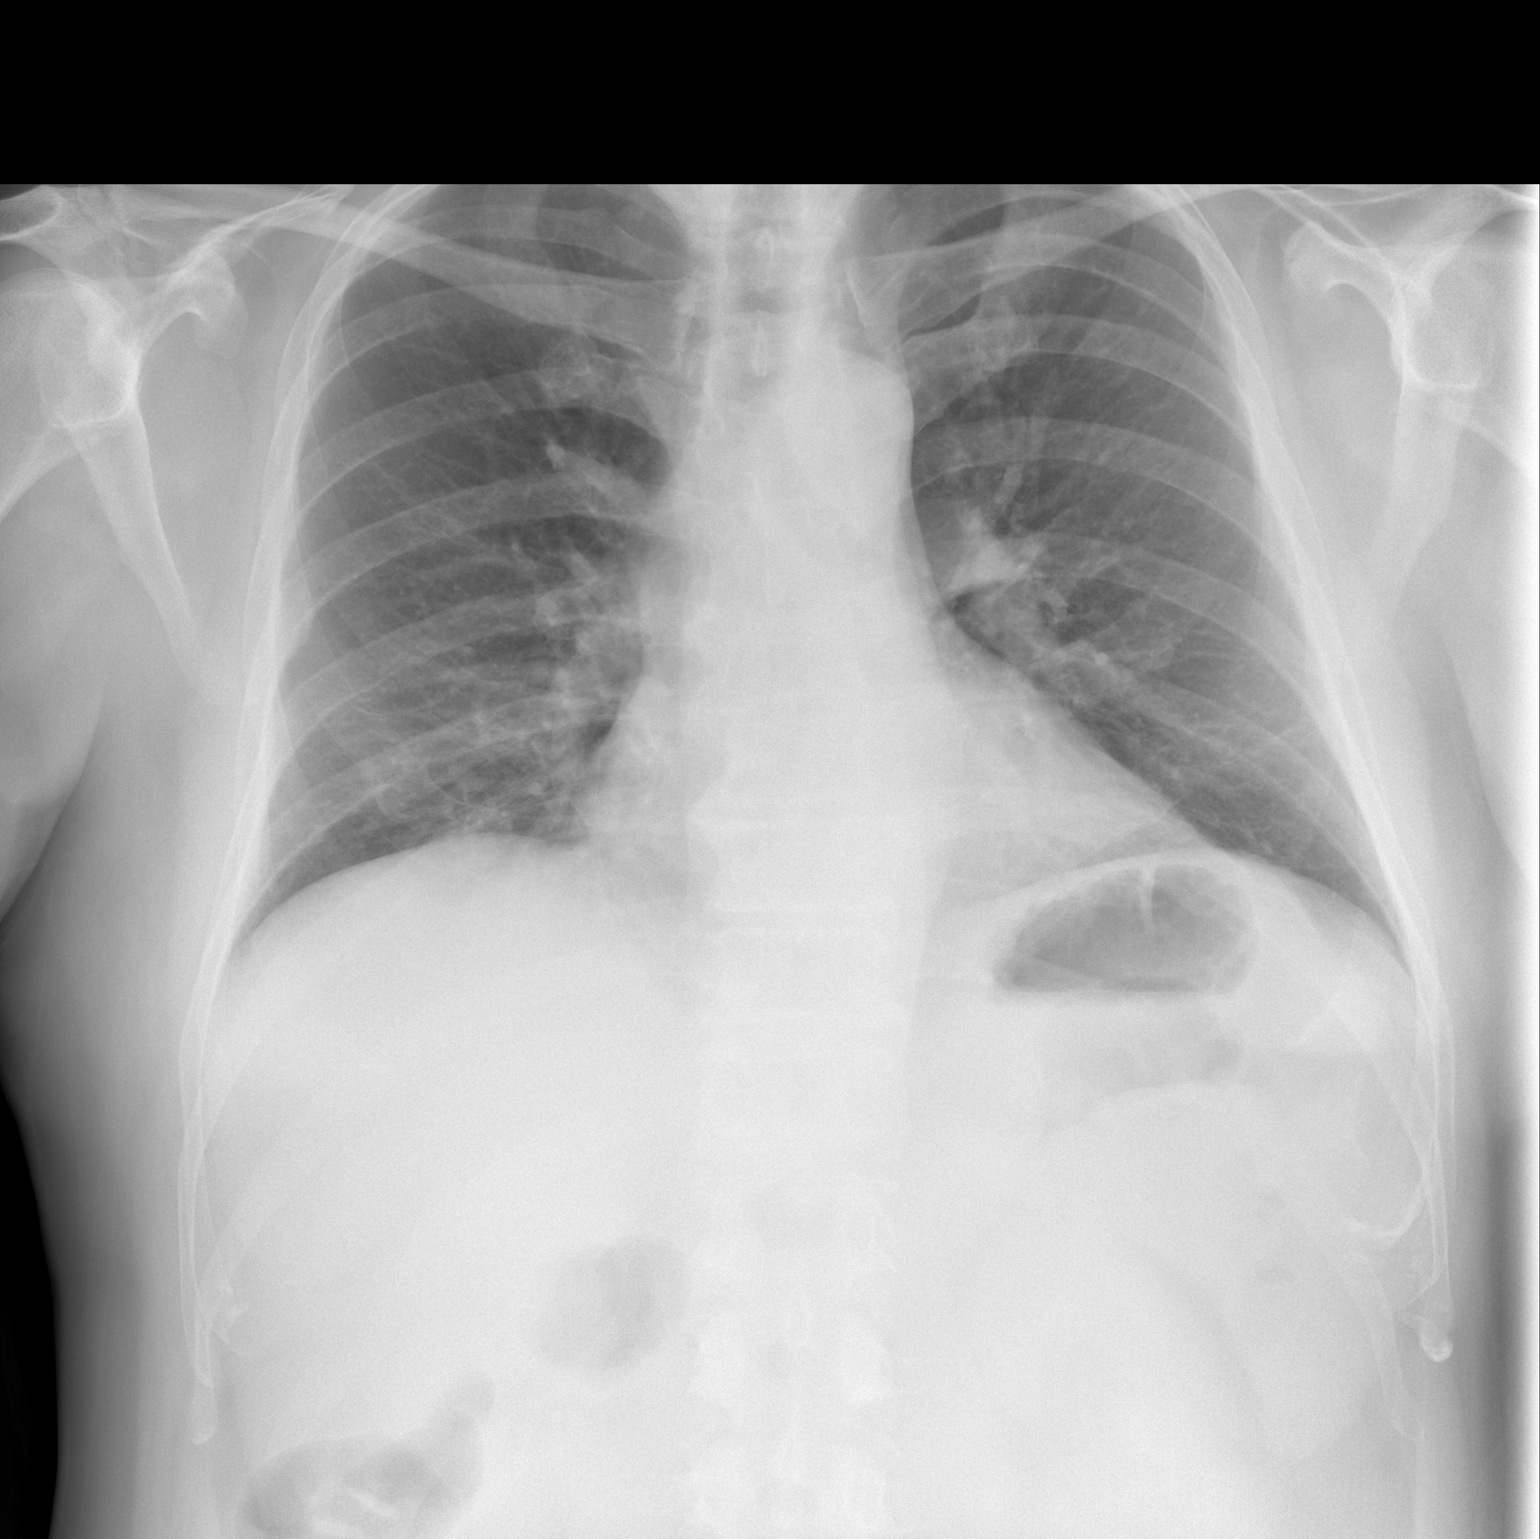

[w chest lat]
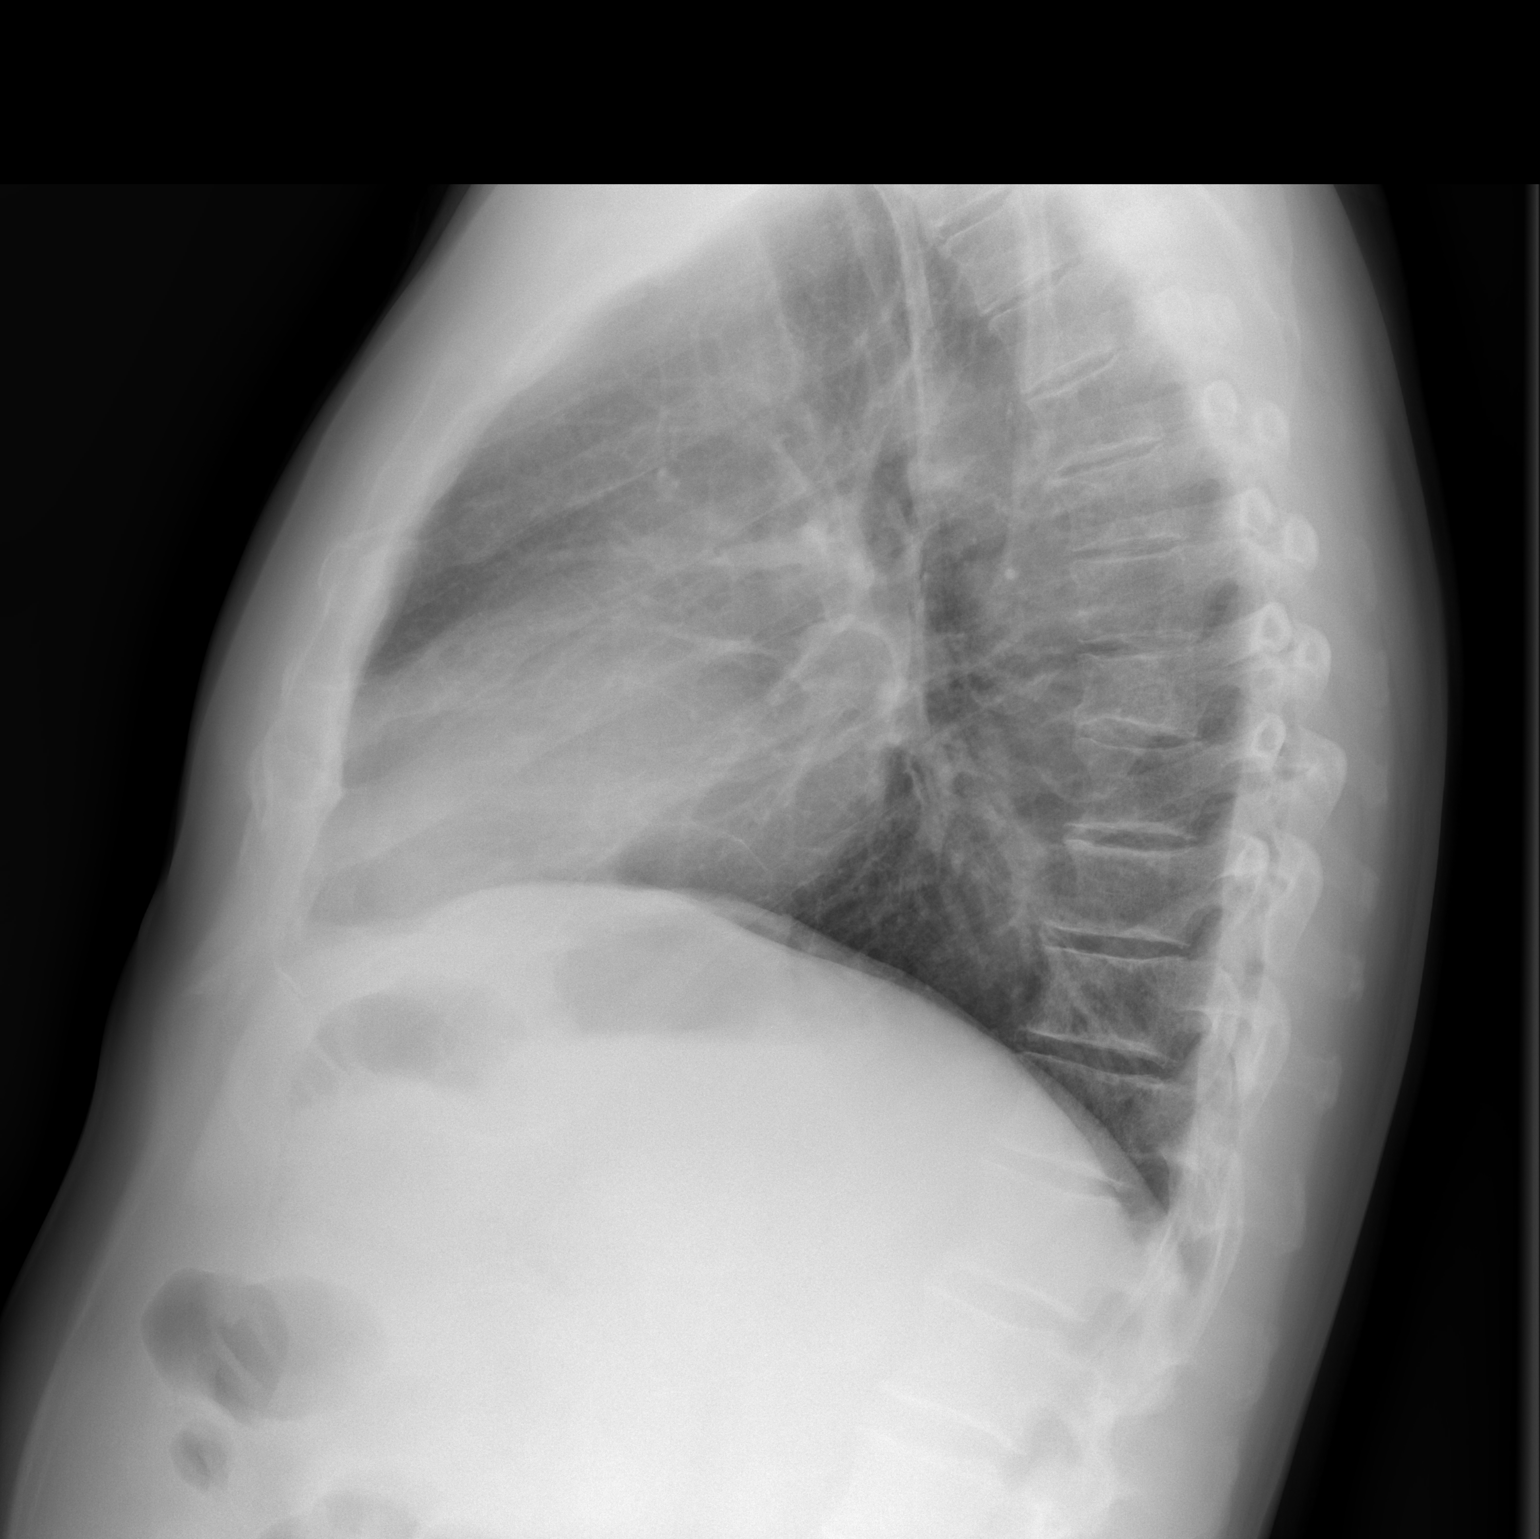

[2 of 2 positions shown; findings below may reference images not displayed]

FINDINGS: Heart size and pulmonary vascularity are normal and the
lungs are clear.  No acute osseous abnormality.
IMPRESSION: No acute disease.

## 2013-05-03 ENCOUNTER — Ambulatory Visit (HOSPITAL_BASED_OUTPATIENT_CLINIC_OR_DEPARTMENT_OTHER): Payer: BC Managed Care – PPO | Admitting: Lab

## 2013-05-03 ENCOUNTER — Telehealth: Payer: Self-pay | Admitting: *Deleted

## 2013-05-03 ENCOUNTER — Ambulatory Visit (HOSPITAL_BASED_OUTPATIENT_CLINIC_OR_DEPARTMENT_OTHER): Payer: BC Managed Care – PPO

## 2013-05-03 ENCOUNTER — Telehealth: Payer: Self-pay | Admitting: Oncology

## 2013-05-03 ENCOUNTER — Ambulatory Visit (HOSPITAL_BASED_OUTPATIENT_CLINIC_OR_DEPARTMENT_OTHER): Payer: BC Managed Care – PPO | Admitting: Oncology

## 2013-05-03 VITALS — BP 147/88 | HR 69 | Temp 97.2°F | Resp 18 | Ht 72.0 in | Wt 205.5 lb

## 2013-05-03 DIAGNOSIS — C9000 Multiple myeloma not having achieved remission: Secondary | ICD-10-CM

## 2013-05-03 LAB — CBC WITH DIFFERENTIAL/PLATELET
BASO%: 3.9 % — ABNORMAL HIGH (ref 0.0–2.0)
EOS%: 12.3 % — ABNORMAL HIGH (ref 0.0–7.0)
Eosinophils Absolute: 0.4 10*3/uL (ref 0.0–0.5)
HGB: 12.6 g/dL — ABNORMAL LOW (ref 13.0–17.1)
LYMPH%: 31.2 % (ref 14.0–49.0)
MCH: 33.5 pg — ABNORMAL HIGH (ref 27.2–33.4)
MCHC: 35.4 g/dL (ref 32.0–36.0)
MCV: 94.7 fL (ref 79.3–98.0)
MONO%: 9.3 % (ref 0.0–14.0)
Platelets: 123 10*3/uL — ABNORMAL LOW (ref 140–400)
RBC: 3.76 10*6/uL — ABNORMAL LOW (ref 4.20–5.82)
RDW: 14.7 % — ABNORMAL HIGH (ref 11.0–14.6)
lymph#: 1 10*3/uL (ref 0.9–3.3)
nRBC: 0 % (ref 0–0)

## 2013-05-03 LAB — BASIC METABOLIC PANEL (CC13)
Anion Gap: 9 mEq/L (ref 3–11)
CO2: 20 mEq/L — ABNORMAL LOW (ref 22–29)
Calcium: 9.1 mg/dL (ref 8.4–10.4)
Chloride: 109 mEq/L (ref 98–109)
Creatinine: 0.8 mg/dL (ref 0.7–1.3)

## 2013-05-03 IMAGING — CT CT CHEST W/ CM
2 of 3 series · 15 of 36 positions shown, 18 images · IV contrast (OMNIPAQUE)
Comparison: 09/16/2012.

CLINICAL DATA: Fever and cough.  Multiple myeloma.

CT CHEST WITH CONTRAST
TECHNIQUE: Multidetector CT imaging of the chest was performed
following the standard protocol during bolus administration of
intravenous contrast.
Contrast: 100mL OMNIPAQUE IOHEXOL 300 MG/ML  SOLN

[Series 2: chest with st · axial · 0.85mm/px · z∈[+1695,+1955]mm · 12 of 62 slices shown, 15 images]
[im 5/62  mediastinal]
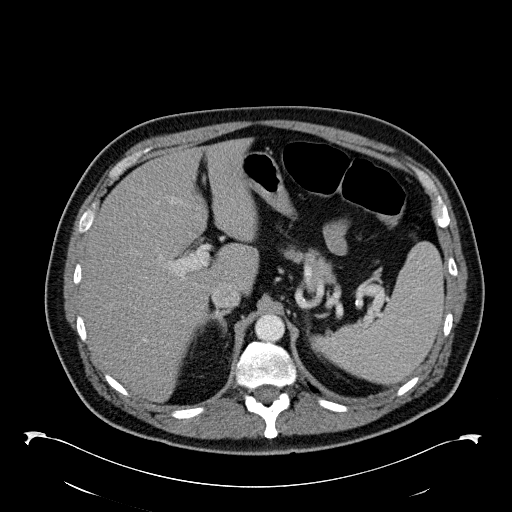
[im 5/62  lung]
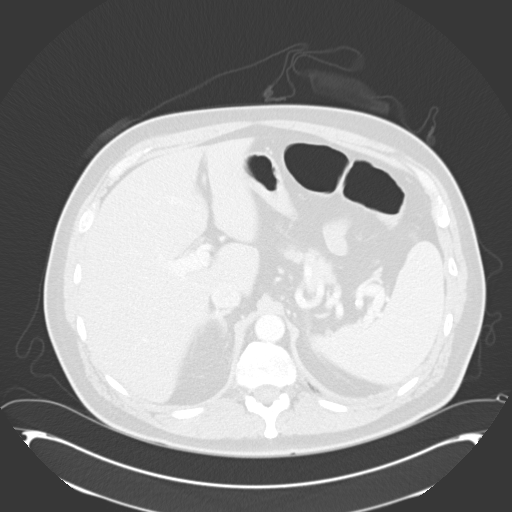
[im 10/62  lung]
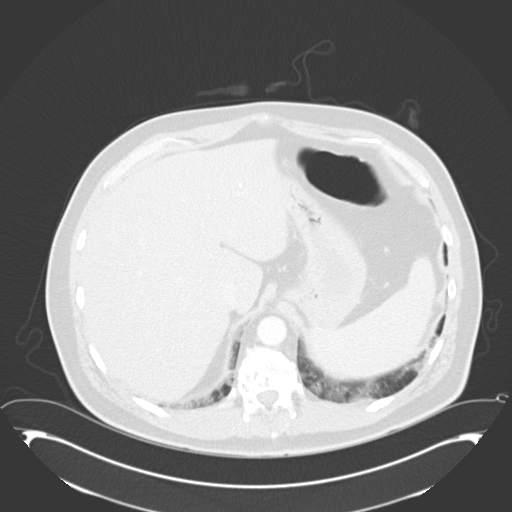
[im 14/62  lung]
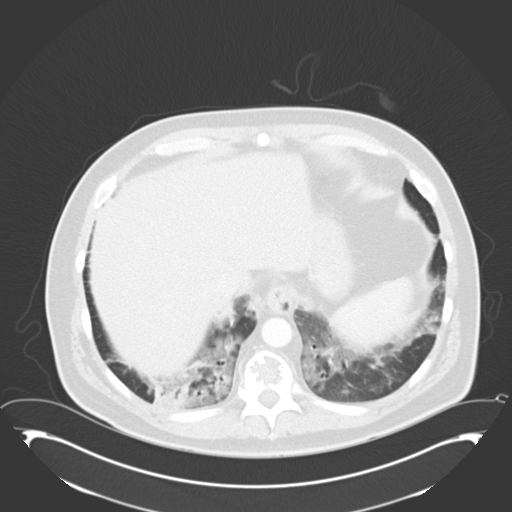
[im 19/62  lung]
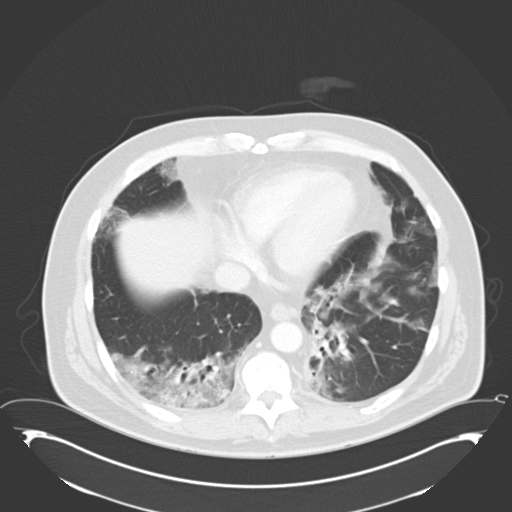
[im 23/62  mediastinal]
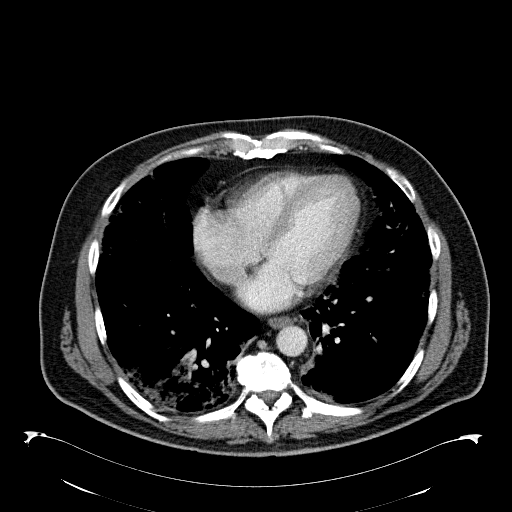
[im 23/62  lung]
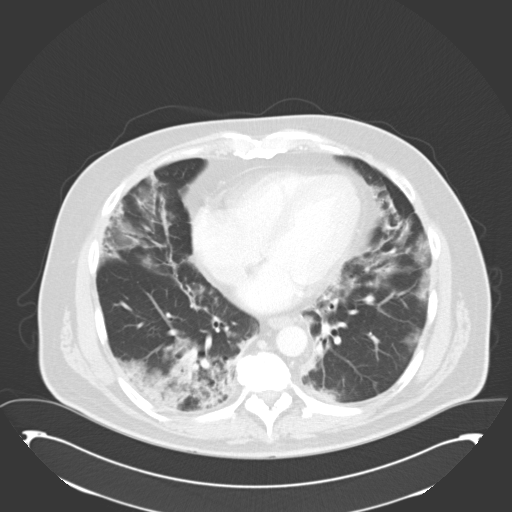
[im 28/62  lung]
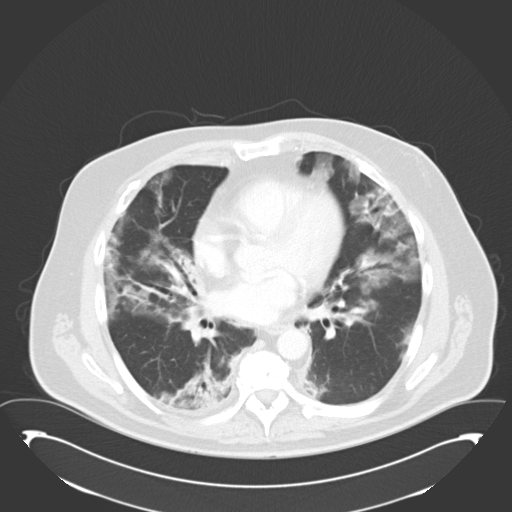
[im 34/62  lung]
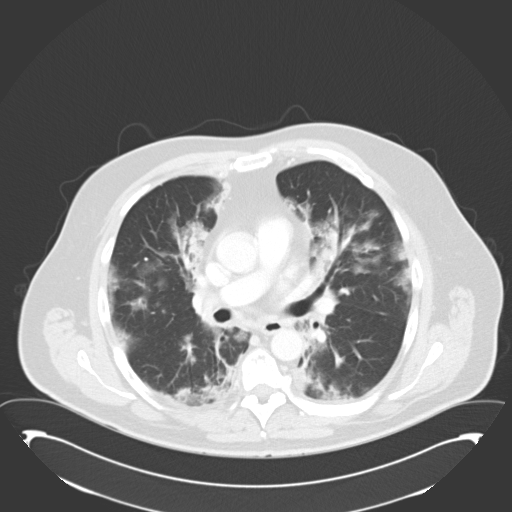
[im 39/62  lung]
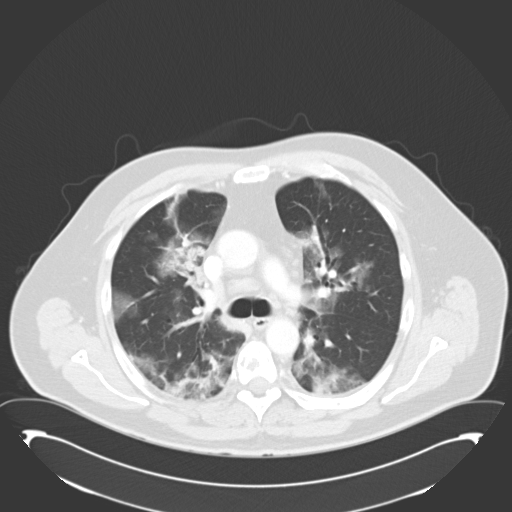
[im 43/62  mediastinal]
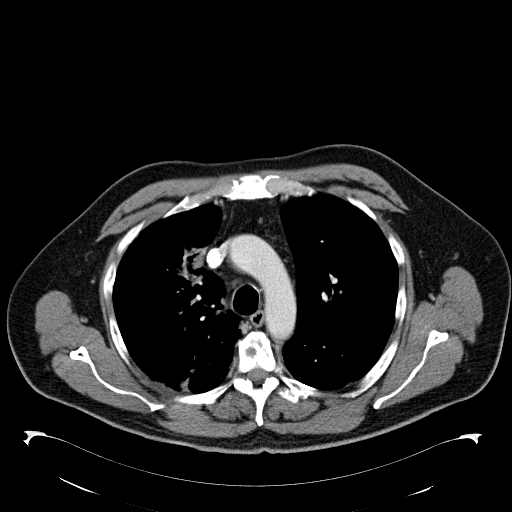
[im 43/62  lung]
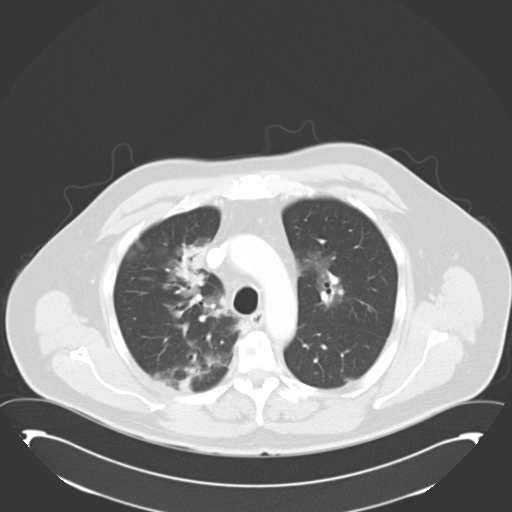
[im 48/62  lung]
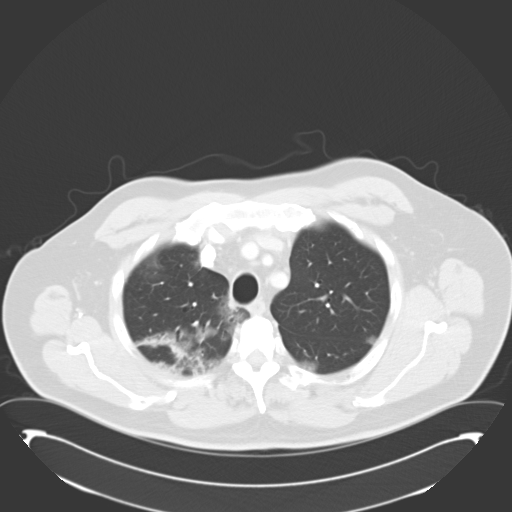
[im 52/62  lung]
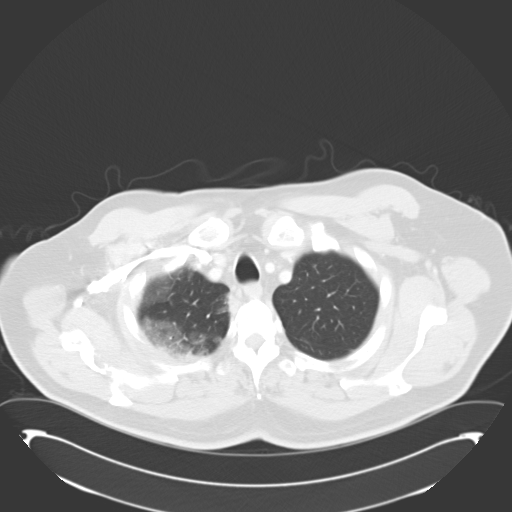
[im 57/62  lung]
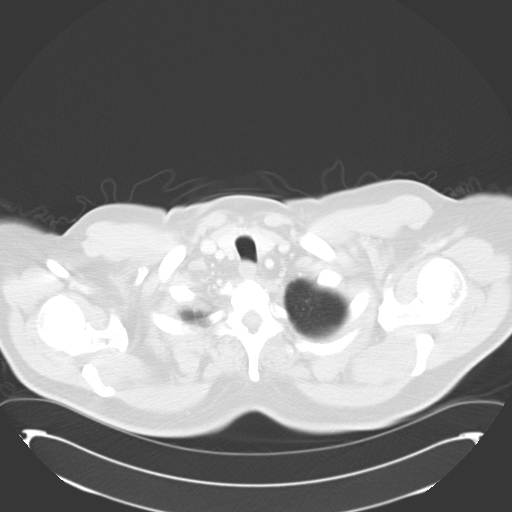

[Series 602: coronal · coronal · 0.85mm/px · 3 of 144 slices shown]
[im 29/144  lung]
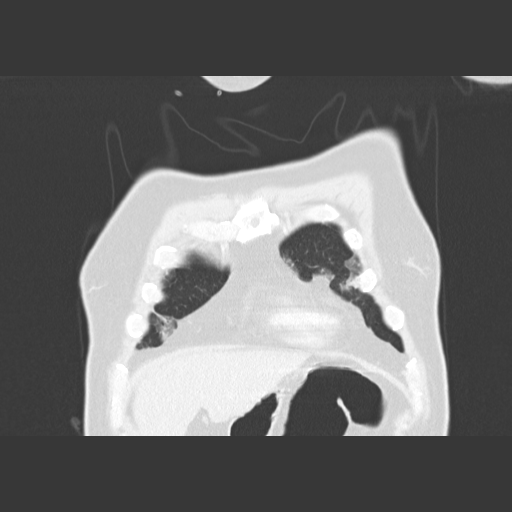
[im 58/144  lung]
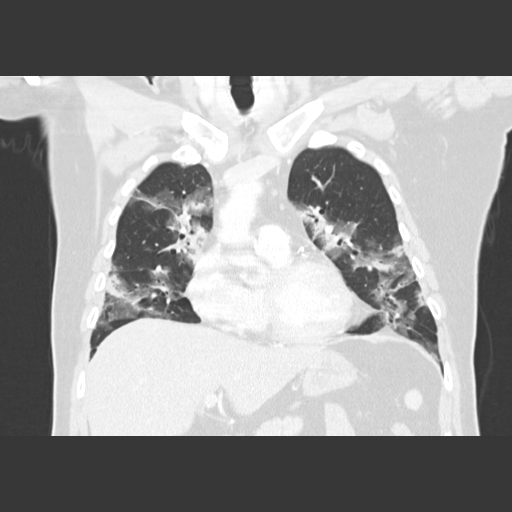
[im 86/144  lung]
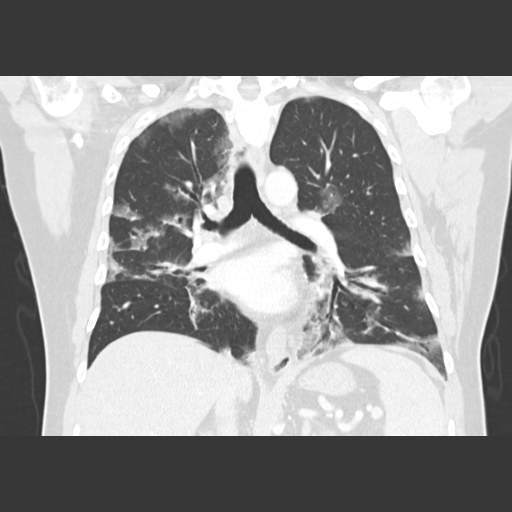

[15 of 36 positions shown; findings below may reference images not displayed]

FINDINGS: Mediastinal lymph nodes are not enlarged by CT size
criteria.  No hilar or axillary adenopathy.  Heart size normal.
Coronary artery calcification.  No pericardial effusion.  There may
be a tiny hiatal hernia.

Interval worsening in multi focal patchy airspace consolidation and
ground-glass, involving all lobes of both lungs.  No pleural fluid.
Airway is unremarkable.

Incidental imaging of the upper abdomen shows no acute findings.
Lytic lesions are seen throughout the visualized osseous
structures.
IMPRESSION: 1.  Multilobar airspace consolidation and ground-glass are most
indicative of pneumonia, progressive from 09/16/2012.
2.  Osseous changes of multiple myeloma.

## 2013-05-03 MED ORDER — ZOLEDRONIC ACID 4 MG/100ML IV SOLN
4.0000 mg | Freq: Once | INTRAVENOUS | Status: AC
Start: 2013-05-03 — End: 2013-05-03
  Administered 2013-05-03: 4 mg via INTRAVENOUS
  Filled 2013-05-03: qty 100

## 2013-05-03 MED ORDER — SODIUM CHLORIDE 0.9 % IV SOLN
Freq: Once | INTRAVENOUS | Status: AC
  Administered 2013-05-03: 10:00:00 via INTRAVENOUS

## 2013-05-03 NOTE — Progress Notes (Signed)
[facility]    OFFICE PROGRESS NOTE   INTERVAL HISTORY:   [name] continues daily Revlimid. He reports intermittent loose stools and cramping in the toes at night. No right shoulder pain. No recent infection. He was seen at [facility] recently and brought a vaccine schedule today.  Objective:  Vital signs in last 24 hours:  Blood pressure 147/88, pulse 69, temperature 97.2 F (36.2 C), temperature source Oral, resp. rate 18, height 6' (1.829 m), weight 205 lb 8 oz (93.214 kg).    HEENT: No thrush or ulcers Resp: Lungs clear bilaterally Cardio: Regular rate and rhythm GI: No hepatosplenomegaly Vascular: No leg edema      Lab Results:  Lab Results  Component Value Date   WBC 3.3* 05/03/2013   HGB 12.6* 05/03/2013   HCT 35.6* 05/03/2013   MCV 94.7 05/03/2013   PLT 123* 05/03/2013   ANC 1.4 Potassium 3.7, creatinine 0.8   Medications: I have reviewed the patient's current medications.  Assessment/Plan: 1. Multiple myeloma-status post high-dose chemotherapy with autologous stem cell support in November of 2004. The sero monoclonal protein was slowly rising and a restaging bone marrow biopsy in November 2009 confirmed 10 to 20% plasma cells. The serum light chains were elevated 07/25/2008. He completed 12 cycles of salvage therapy with Revlimid/Decadron with the last cycle initiated 06/16/2009. On 07/13/2009 the serum free light chains were normal and a serum protein electrophoresis/immunofixation revealed no monoclonal protein. Elevated serum M spike and serum free kappa light chains March 2013. He began cycle 1 of Revlimid 25 mg daily x21 days followed by a 7 day break on 09/27/2011. He began a second cycle of salvage Revlimid beginning on 10/25/2011. The serum M spike was stable and the serum free kappa light chains were slightly more elevated on 11/19/2011. He began another cycle of Revlimid/Decadron on 11/22/2011. Velcade was added beginning on 11/29/2011. He  completed 3 cycles of Revlimid/Velcade/Decadron with improvement in the serum M spike/serum free kappa light chains. The last cycle was initiated on 01/20/2012. He received melphalan 200 mg per meter squared on 03/17/2012 with autologous stem cell support on 03/18/2012. Serum immunofixation confirmed a monoclonal IgG kappa protein, too low to quantify and a mild elevation of the serum free kappa light chains on 05/19/2012. He began maintenance Revlimid on 07/11/2012. Serum M spike 0.25, serum free kappa light chains 3.04 on 08/07/2012. Revlimid was placed on hold during the 09/16/2012 hospital admission. Serum M spike 0.45 and serum free kappa light chains 1.83 on 10/21/2012. Revlimid was resumed 11/04/2012. The serum M spike was not detected and the kappa light chains were normal on 03/05/2013. 2. A serum immunofixation confirmed a monoclonal protein and the serum Kappa and Lambda light chains were mildly elevated in June of 2012  3. History of elevated liver enzymes. 4. Right lung pneumonia June, 2009 -- resolved after treatment with Bactrim and azithromycin. 5. 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 He continues to have intermittent numbness in the feet. 6. History of hypokalemia -he is no longer taking a potassium supplement 7. Pneumonia 01/06/2009 -- requiring hospitalization 01/10/2011 -- status post broad spectrum antibiotic therapy with clinical resolution. 8. Upper respiratory infection in February, 2011 -- likely a viral URI.  9. Right shoulder/upper arm pain. Plain x-ray of the right shoulder/humerus on 10/16/2011 showed 2 small lytic lesions at the proximal humeral shaft suspicious for myeloma involvement. MRI of the right shoulder 10/27/2011 confirmed multiple myelomatous  lesions involving the proximal right humerus, scapula, and ribs. A large destructive lesion was noted in the scapular body with an associated soft tissue component.  He completed palliative radiation to the right scapula on 11/19/2011 with partial improvement in the shoulder discomfort. 10. "Hemorrhoid" discomfort and urinary hesitancy on 11/15/2011. He appeared to have an ulcer or fissure at the anal verge. Symptoms have resolved. 11. Fever/sepsis/bacteremia/question pneumonia September 2013 following stem cell therapy. 12. Admission with pneumonia 09/16/2012-influenza A was confirmed on a bronchoscopy03/04/2013, he was treated with Tamiflu. 13. Thrombocytopenia-present on hospital admission 09/16/2012-likely related to the recent infection. The thrombocytopenia could also be related to Revlimid, antibiotics, or progression of the multiple myeloma. The platelet count has improved 14. Abundant atypical squamous cells on the bronchial washing cytology from 09/21/2012-most likely secondary to reactive changes, no clinical history to suggest a diagnosis of non-small cell lung cancer.       15. Pain right upper arm. Plain x-ray 11/04/2012 with a 6 mm lytic lesion in the proximal humeral shaft. No fracture or dislocation. Improved following steroid injections and physical therapy.probable adhesive capsulitis-resolved    Disposition:  Mr. Heintzelman appears stable. He will continue Revlimid. He will receive Zometa today. We will administer the vaccine schedule as recommended by the [facility] transplant service. He will return for an office visit and Zometa in 3 months. We will followup on the serum light chains and serum protein electrophoresis from today.   Thornton Papas, MD  05/03/2013  5:59 PM

## 2013-05-03 NOTE — Telephone Encounter (Signed)
Per staff message and POF I have scheduled appts.  JMW  

## 2013-05-03 NOTE — Telephone Encounter (Signed)
Gave pt appt for lab ,MD and injections for january and November 2014

## 2013-05-03 NOTE — Telephone Encounter (Signed)
Called pt and left message regarding chemo °

## 2013-05-04 ENCOUNTER — Other Ambulatory Visit: Payer: Self-pay | Admitting: *Deleted

## 2013-05-04 DIAGNOSIS — C9000 Multiple myeloma not having achieved remission: Secondary | ICD-10-CM

## 2013-05-04 NOTE — Addendum Note (Signed)
Addended by: Caleb Popp on: 05/04/2013 08:52 AM   Modules accepted: Orders

## 2013-05-05 LAB — PROTEIN ELECTROPHORESIS, SERUM
Albumin ELP: 58.1 % (ref 55.8–66.1)
Alpha-2-Globulin: 12.4 % — ABNORMAL HIGH (ref 7.1–11.8)
Beta 2: 6.7 % — ABNORMAL HIGH (ref 3.2–6.5)
Total Protein, Serum Electrophoresis: 6.7 g/dL (ref 6.0–8.3)

## 2013-05-05 LAB — KAPPA/LAMBDA LIGHT CHAINS: Kappa free light chain: 2.22 mg/dL — ABNORMAL HIGH (ref 0.33–1.94)

## 2013-05-08 IMAGING — CR DG CHEST 2V
2 series · 2 of 2 positions shown · non-contrast
Comparison: September 22, 2012.

CLINICAL DATA: Pneumonia.

CHEST - 2 VIEW

[w chest pa]
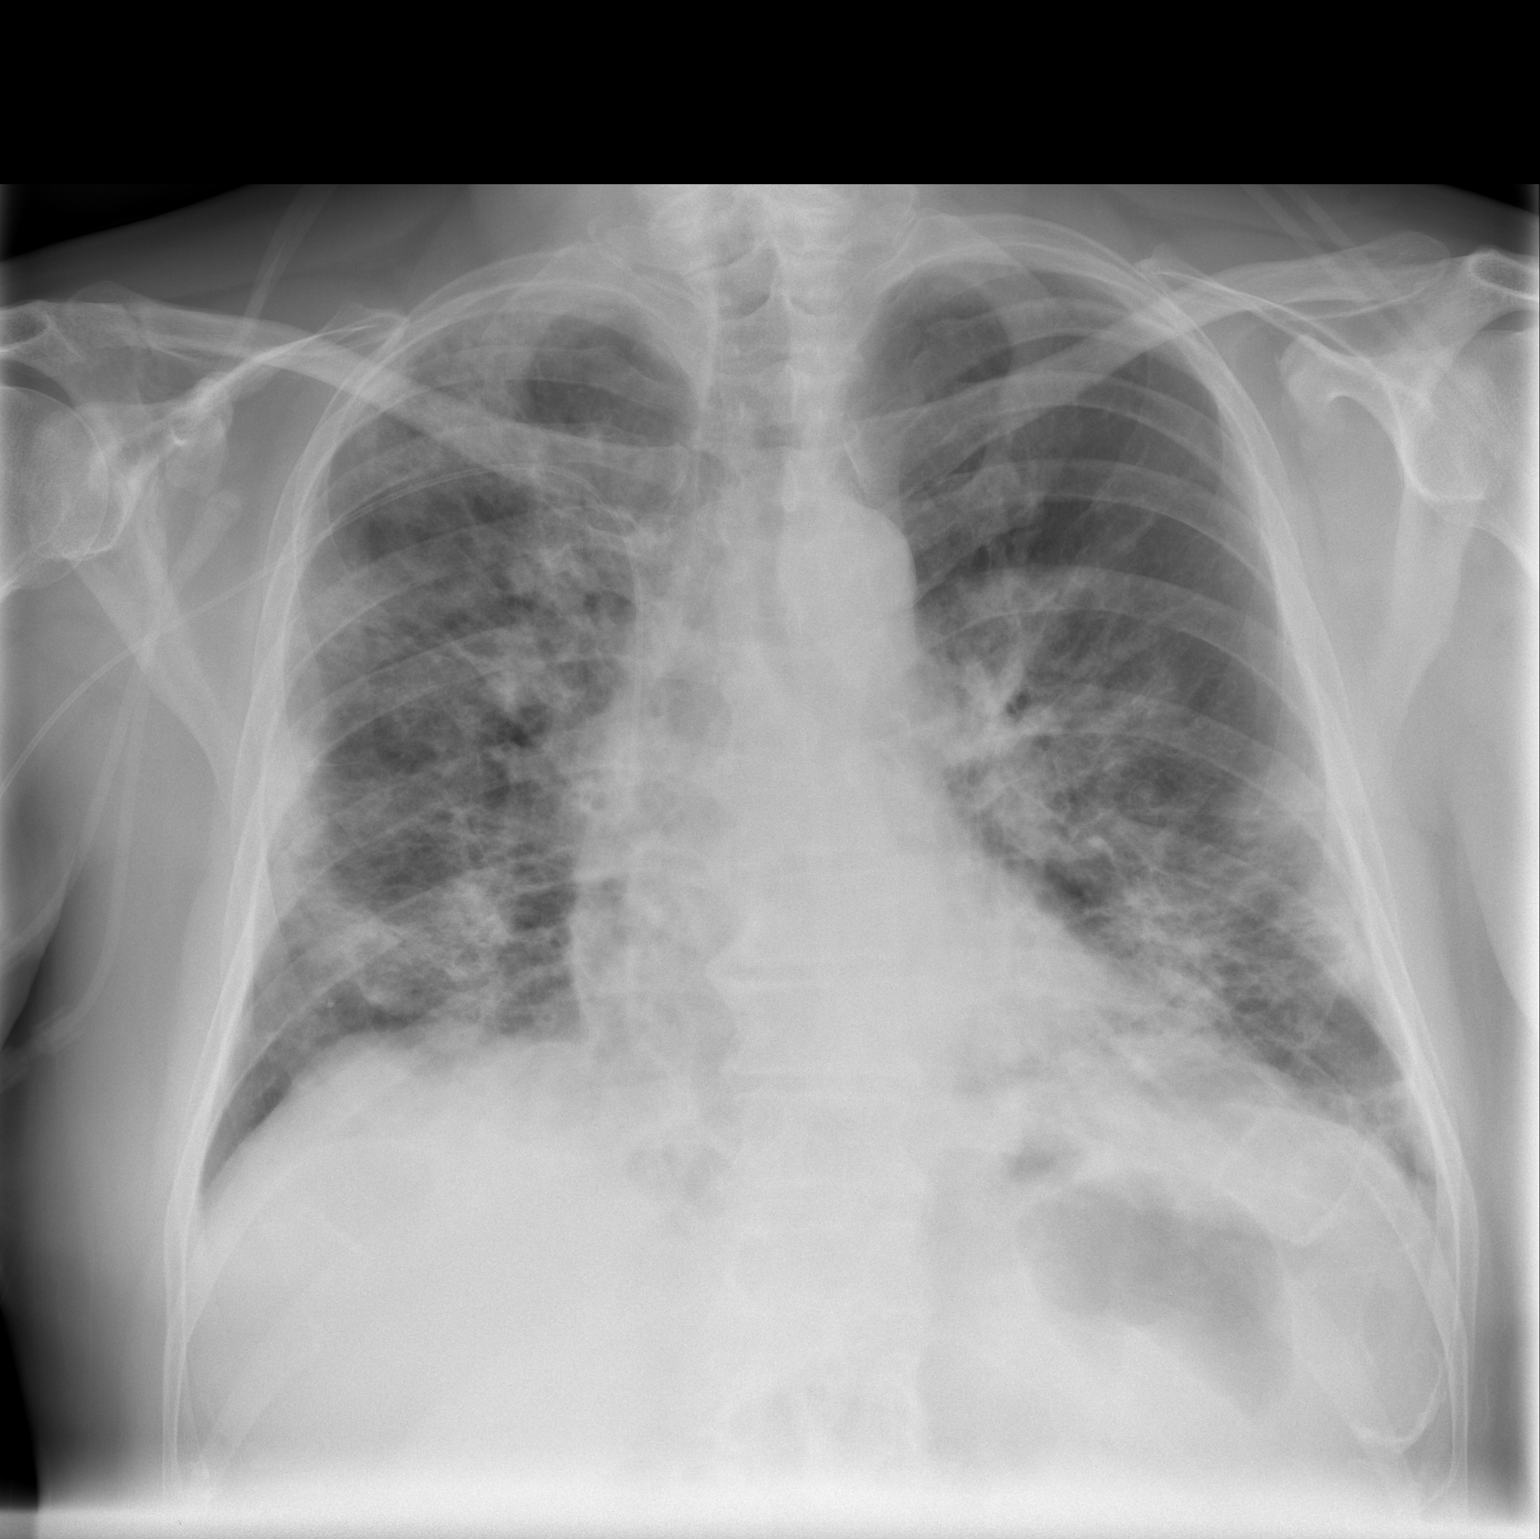

[w chest lat]
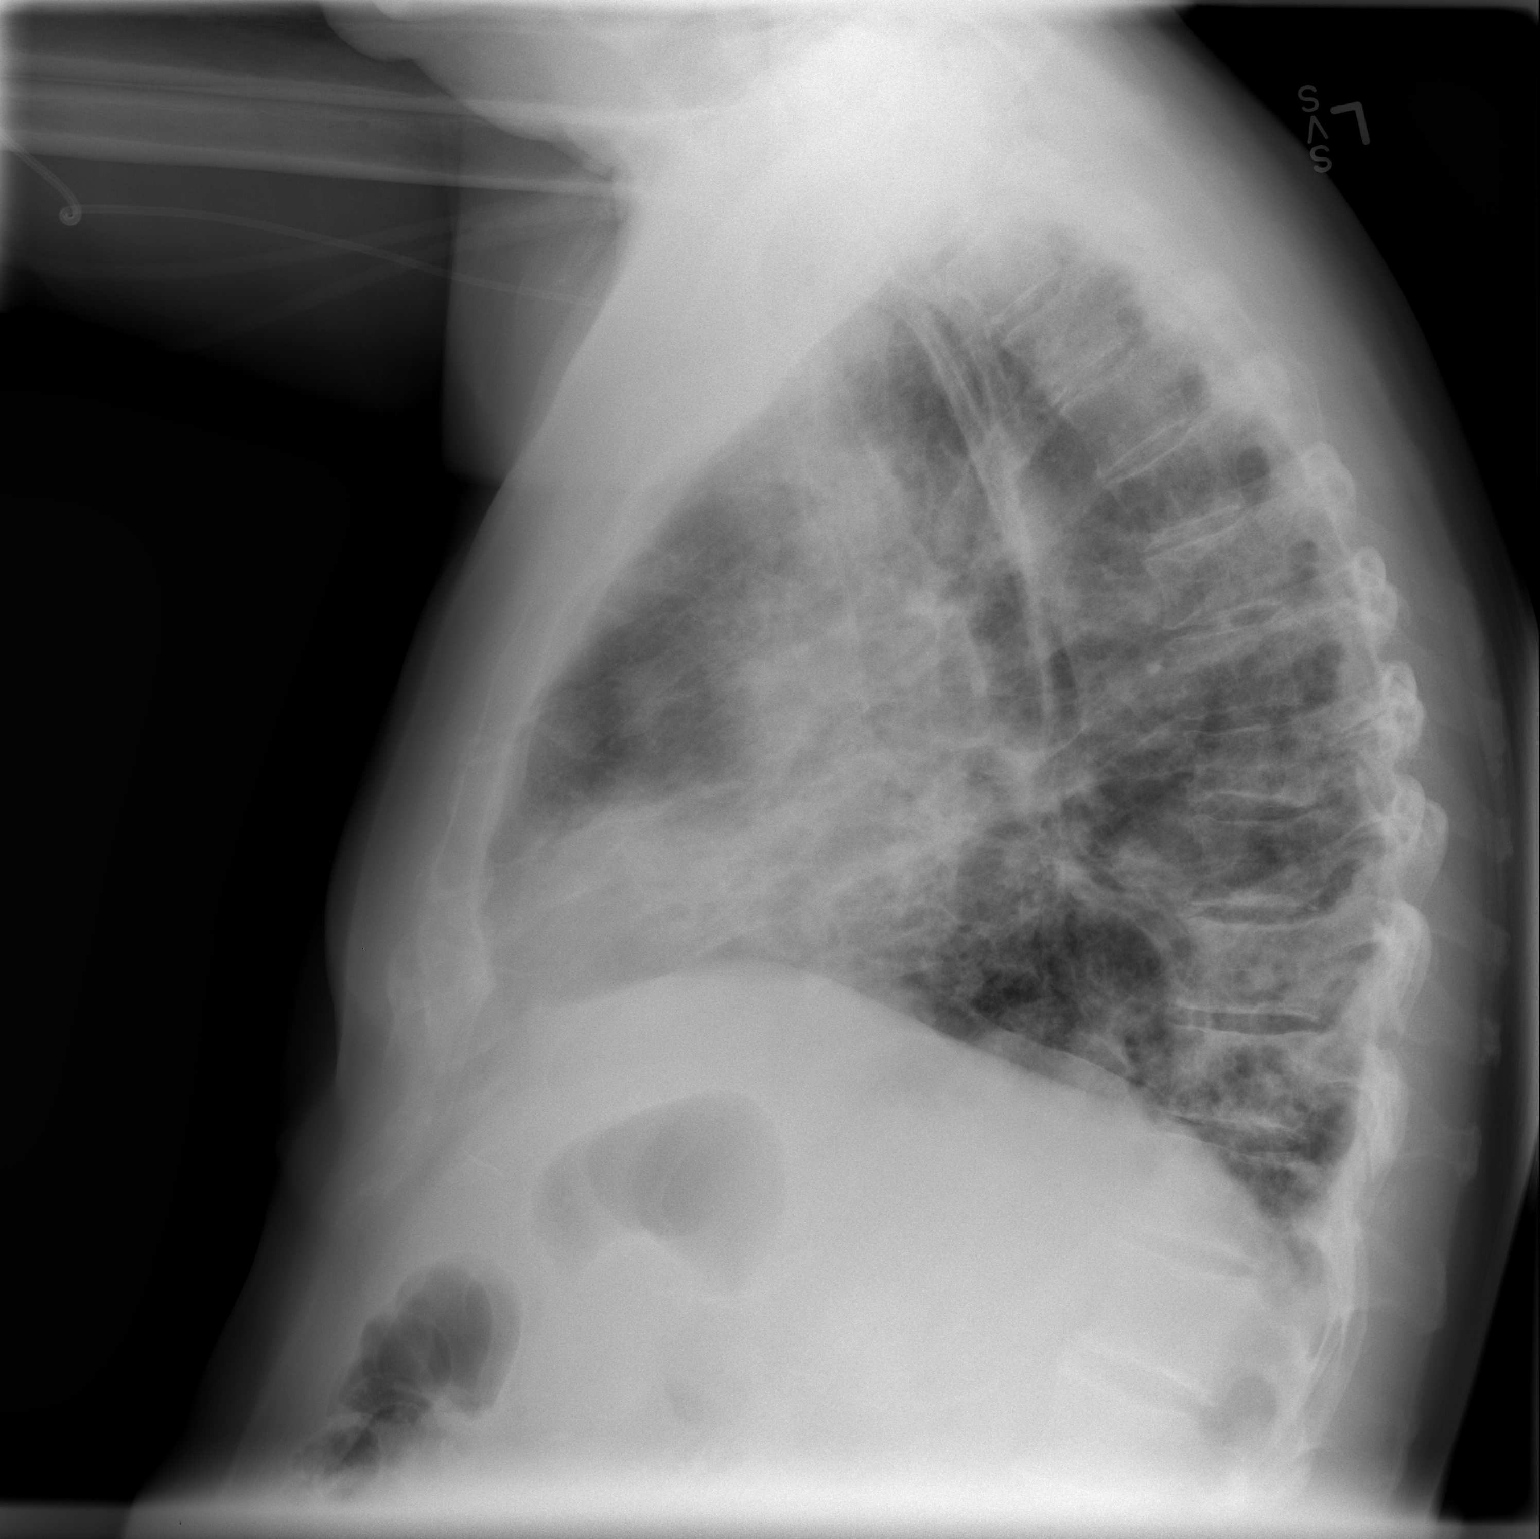

[2 of 2 positions shown; findings below may reference images not displayed]

FINDINGS: Cardiomediastinal silhouette appears normal.  Patchy
alveolar opacities are again noted throughout both lungs which are
not significantly changed and most consistent with pneumonia.
Right-sided PICC line is unchanged.  No pneumothorax or pleural
effusions are noted.  Bony thorax is intact.
IMPRESSION: No change noted in bilateral patchy alveolar opacities concerning
for pneumonia.

## 2013-05-10 ENCOUNTER — Ambulatory Visit: Payer: BC Managed Care – PPO | Admitting: Oncology

## 2013-05-13 ENCOUNTER — Other Ambulatory Visit: Payer: Self-pay | Admitting: *Deleted

## 2013-05-13 DIAGNOSIS — C9000 Multiple myeloma not having achieved remission: Secondary | ICD-10-CM

## 2013-05-13 MED ORDER — LENALIDOMIDE 10 MG PO CAPS: 10.0000 mg | ORAL_CAPSULE | Freq: Every day | ORAL | Status: DC

## 2013-05-13 NOTE — Telephone Encounter (Signed)
Refill request to MD desk 

## 2013-06-03 ENCOUNTER — Other Ambulatory Visit (HOSPITAL_BASED_OUTPATIENT_CLINIC_OR_DEPARTMENT_OTHER): Payer: BC Managed Care – PPO | Admitting: Lab

## 2013-06-03 ENCOUNTER — Ambulatory Visit (HOSPITAL_BASED_OUTPATIENT_CLINIC_OR_DEPARTMENT_OTHER): Payer: BC Managed Care – PPO

## 2013-06-03 VITALS — BP 144/86 | HR 67 | Temp 97.6°F | Resp 18

## 2013-06-03 DIAGNOSIS — C9000 Multiple myeloma not having achieved remission: Secondary | ICD-10-CM

## 2013-06-03 DIAGNOSIS — Z48298 Encounter for aftercare following other organ transplant: Secondary | ICD-10-CM

## 2013-06-03 DIAGNOSIS — Z23 Encounter for immunization: Secondary | ICD-10-CM

## 2013-06-03 LAB — CBC WITH DIFFERENTIAL/PLATELET
BASO%: 2.5 % — ABNORMAL HIGH (ref 0.0–2.0)
Basophils Absolute: 0.1 10*3/uL (ref 0.0–0.1)
EOS%: 11.2 % — ABNORMAL HIGH (ref 0.0–7.0)
HCT: 37 % — ABNORMAL LOW (ref 38.4–49.9)
LYMPH%: 30.4 % (ref 14.0–49.0)
MCH: 34.1 pg — ABNORMAL HIGH (ref 27.2–33.4)
MCHC: 34.6 g/dL (ref 32.0–36.0)
MCV: 98.8 fL — ABNORMAL HIGH (ref 79.3–98.0)
MONO#: 0.3 10*3/uL (ref 0.1–0.9)
MONO%: 10.4 % (ref 0.0–14.0)
NEUT%: 45.5 % (ref 39.0–75.0)
Platelets: 124 10*3/uL — ABNORMAL LOW (ref 140–400)
RBC: 3.75 10*6/uL — ABNORMAL LOW (ref 4.20–5.82)
WBC: 3.2 10*3/uL — ABNORMAL LOW (ref 4.0–10.3)
lymph#: 1 10*3/uL (ref 0.9–3.3)

## 2013-06-03 MED ORDER — HAEMOPHILUS B POLYSAC CONJ VAC IM SOLR
0.5000 mL | Freq: Once | INTRAMUSCULAR | Status: AC
  Administered 2013-06-03: 0.5 mL via INTRAMUSCULAR
  Filled 2013-06-03: qty 0.5

## 2013-06-03 MED ORDER — INFLUENZA VAC SPLIT QUAD 0.5 ML IM SUSP
0.5000 mL | INTRAMUSCULAR | Status: DC
  Filled 2013-06-03: qty 0.5

## 2013-06-03 MED ORDER — PNEUMOCOCCAL 13-VAL CONJ VACC IM SUSP
0.5000 mL | INTRAMUSCULAR | Status: AC
  Administered 2013-06-03: 0.5 mL via INTRAMUSCULAR
  Filled 2013-06-03: qty 0.5

## 2013-06-03 MED ORDER — DTAP-HEPATITIS B RECOMB-IPV IM SUSP
0.5000 mL | Freq: Once | INTRAMUSCULAR | Status: AC
  Administered 2013-06-03: 0.5 mL via INTRAMUSCULAR
  Filled 2013-06-03: qty 0.5

## 2013-06-14 ENCOUNTER — Telehealth: Payer: Self-pay | Admitting: *Deleted

## 2013-06-14 NOTE — Telephone Encounter (Signed)
Spoke with pt, he will come in to be evaluated 12/3 at 0830. Request to schedulers to enter appt.

## 2013-06-14 NOTE — Telephone Encounter (Signed)
Call from pt reporting he has a "pretty good sized lump" on the back of his neck. Has been there about a week. Asking if he should see Dr. Truett Perna for this. Reviewed with Dr. Truett Perna: work pt in 12/2 at 0815 or 12/3 at 0830. Left message on voicemail for pt to call and confirm which time works best for him.

## 2013-06-16 ENCOUNTER — Ambulatory Visit (HOSPITAL_BASED_OUTPATIENT_CLINIC_OR_DEPARTMENT_OTHER): Payer: BC Managed Care – PPO | Admitting: Lab

## 2013-06-16 ENCOUNTER — Ambulatory Visit (HOSPITAL_BASED_OUTPATIENT_CLINIC_OR_DEPARTMENT_OTHER): Payer: BC Managed Care – PPO | Admitting: Oncology

## 2013-06-16 ENCOUNTER — Ambulatory Visit (INDEPENDENT_AMBULATORY_CARE_PROVIDER_SITE_OTHER): Payer: BC Managed Care – PPO | Admitting: General Surgery

## 2013-06-16 ENCOUNTER — Telehealth: Payer: Self-pay | Admitting: Oncology

## 2013-06-16 ENCOUNTER — Encounter (INDEPENDENT_AMBULATORY_CARE_PROVIDER_SITE_OTHER): Payer: Self-pay | Admitting: General Surgery

## 2013-06-16 VITALS — BP 151/84 | HR 66 | Temp 98.3°F | Resp 18 | Ht 72.0 in | Wt 207.2 lb

## 2013-06-16 VITALS — BP 138/74 | HR 92 | Temp 98.4°F | Resp 15 | Ht 72.0 in | Wt 208.0 lb

## 2013-06-16 DIAGNOSIS — L723 Sebaceous cyst: Secondary | ICD-10-CM

## 2013-06-16 DIAGNOSIS — C9000 Multiple myeloma not having achieved remission: Secondary | ICD-10-CM

## 2013-06-16 DIAGNOSIS — L0211 Cutaneous abscess of neck: Secondary | ICD-10-CM

## 2013-06-16 LAB — CBC WITH DIFFERENTIAL/PLATELET
BASO%: 2.6 % — ABNORMAL HIGH (ref 0.0–2.0)
Basophils Absolute: 0.1 10*3/uL (ref 0.0–0.1)
EOS%: 8.5 % — ABNORMAL HIGH (ref 0.0–7.0)
HGB: 12.8 g/dL — ABNORMAL LOW (ref 13.0–17.1)
LYMPH%: 32.6 % (ref 14.0–49.0)
MCH: 33.8 pg — ABNORMAL HIGH (ref 27.2–33.4)
MCHC: 35.6 g/dL (ref 32.0–36.0)
MCV: 95 fL (ref 79.3–98.0)
MONO%: 9.4 % (ref 0.0–14.0)
NEUT%: 46.9 % (ref 39.0–75.0)
Platelets: 125 10*3/uL — ABNORMAL LOW (ref 140–400)
RDW: 14.6 % (ref 11.0–14.6)
lymph#: 1.1 10*3/uL (ref 0.9–3.3)

## 2013-06-16 MED ORDER — DOXYCYCLINE HYCLATE 100 MG PO TABS: 100.0000 mg | ORAL_TABLET | Freq: Two times a day (BID) | ORAL | Status: DC

## 2013-06-16 NOTE — Progress Notes (Signed)
Subjective:     Patient ID: Chad [name], male   DOB: [birth year], 57 y.o.   MRN: [id number]  HPI  This is a 57 year old male with a history of myeloma. He is undergoing a stem cell transplant. He is doing very well from that right now. He presents with a 10 day history of a lump that has been increasing on his posterior neck. There's been a little but of drainage from this. He denies any fevers or any other complaints. He was seen by his oncologist Dr. Truett Perna was 1 referred over for evaluation for possible infected sebaceous cyst. Review of Systems     Objective:   Physical Exam Posterior neck with infected sebaceous cyst, surrounding erythema, draining from skin hole in middle    Assessment:     Infected sebaceous cyst     Plan:     I told him I did not think this anything to do with his myeloma. I discussed draining it. I cleansed it. I then anesthetized with lidocaine. I made a cruciate incision and drained a fair amount of sebaceous cyst material as well as some purulence. It immediately looked better. I then packed this with iodoform gauze. Dressing was placed. He's going to remove this in 2 days and let this heal at that point. Told to keep it covered while it is draining. I will plan on seeing him next week. I did place him on a short course of antibiotics the erythema. He will call back if he has any other problems.

## 2013-06-16 NOTE — Telephone Encounter (Signed)
gv and printed avs for pt and sent to lab

## 2013-06-16 NOTE — Progress Notes (Signed)
[facility]    OFFICE PROGRESS NOTE   INTERVAL HISTORY:   He returns for an unscheduled visit. He has noted a "lump "at the posterior neck for the past 7-10 days. The surface has become erythematous with "scaling "of the skin. No fever. He continues daily Revlimid. No other complaint.  Objective:  Vital signs in last 24 hours:  Blood pressure 151/84, pulse 66, temperature 98.3 F (36.8 C), temperature source Oral, resp. rate 18, height 6' (1.829 m), weight 207 lb 3.2 oz (93.985 kg).    HEENT: No thrush or ulcers. 2 cm rounded firm cutaneous lesion at the low posterior neck with overlying erythema and a few pustular areas. No drainage. No fluctuance Resp: Lungs clear bilaterally Cardio: Regular rate and rhythm GI: No hepatosplenomegaly, nontender Vascular: No leg edema   Lab Results:  Lab Results  Component Value Date   WBC 3.2* 06/03/2013   HGB 12.8* 06/03/2013   HCT 37.0* 06/03/2013   MCV 98.8* 06/03/2013   PLT 124* 06/03/2013   ANC 1.4    Medications: I have reviewed the patient's current medications.  Assessment/Plan: 1. Multiple myeloma-status post high-dose chemotherapy with autologous stem cell support in November of 2004. The sero monoclonal protein was slowly rising and a restaging bone marrow biopsy in November 2009 confirmed 10 to 20% plasma cells. The serum light chains were elevated 07/25/2008. He completed 12 cycles of salvage therapy with Revlimid/Decadron with the last cycle initiated 06/16/2009. On 07/13/2009 the serum free light chains were normal and a serum protein electrophoresis/immunofixation revealed no monoclonal protein. Elevated serum M spike and serum free kappa light chains March 2013. He began cycle 1 of Revlimid 25 mg daily x21 days followed by a 7 day break on 09/27/2011. He began a second cycle of salvage Revlimid beginning on 10/25/2011. The serum M spike was stable and the serum free kappa light chains were slightly more  elevated on 11/19/2011. He began another cycle of Revlimid/Decadron on 11/22/2011. Velcade was added beginning on 11/29/2011. He completed 3 cycles of Revlimid/Velcade/Decadron with improvement in the serum M spike/serum free kappa light chains. The last cycle was initiated on 01/20/2012. He received melphalan 200 mg per meter squared on 03/17/2012 with autologous stem cell support on 03/18/2012. Serum immunofixation confirmed a monoclonal IgG kappa protein, too low to quantify and a mild elevation of the serum free kappa light chains on 05/19/2012. He began maintenance Revlimid on 07/11/2012. Serum M spike 0.25, serum free kappa light chains 3.04 on 08/07/2012. Revlimid was placed on hold during the 09/16/2012 hospital admission. Serum M spike 0.45 and serum free kappa light chains 1.83 on 10/21/2012. Revlimid was resumed 11/04/2012. The serum M spike was not detected and the kappa light chains were normal on 03/05/2013. 2. A serum immunofixation confirmed a monoclonal protein and the serum Kappa and Lambda light chains were mildly elevated in June of 2012  3. History of elevated liver enzymes. 4. Right lung pneumonia June, 2009 -- resolved after treatment with Bactrim and azithromycin. 5. 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 He continues to have intermittent numbness in the feet. 6. History of hypokalemia -he is no longer taking a potassium supplement 7. Pneumonia 01/06/2009 -- requiring hospitalization 01/10/2011 -- status post broad spectrum antibiotic therapy with clinical resolution. 8. Upper respiratory infection in February, 2011 -- likely a viral URI.  9. Right shoulder/upper arm pain. Plain x-ray of the right shoulder/humerus on 10/16/2011 showed 2  small lytic lesions at the proximal humeral shaft suspicious for myeloma involvement. MRI of the right shoulder 10/27/2011 confirmed multiple myelomatous lesions involving the proximal right  humerus, scapula, and ribs. A large destructive lesion was noted in the scapular body with an associated soft tissue component. He completed palliative radiation to the right scapula on 11/19/2011 with partial improvement in the shoulder discomfort. 10. "Hemorrhoid" discomfort and urinary hesitancy on 11/15/2011. He appeared to have an ulcer or fissure at the anal verge. Symptoms have resolved. 11. Fever/sepsis/bacteremia/question pneumonia September 2013 following stem cell therapy. 12. Admission with pneumonia 09/16/2012-influenza A was confirmed on a bronchoscopy03/04/2013, he was treated with Tamiflu. 13. Thrombocytopenia-present on hospital admission 09/16/2012-likely related to the recent infection. The thrombocytopenia could also be related to Revlimid, antibiotics, or progression of the multiple myeloma. The platelet count has improved 14. Abundant atypical squamous cells on the bronchial washing cytology from 09/21/2012-most likely secondary to reactive changes, no clinical history to suggest a diagnosis of non-small cell lung cancer.       15. Pain right upper arm. Plain x-ray 11/04/2012 with a 6 mm lytic lesion in the proximal humeral shaft. No fracture or dislocation. Improved following steroid injections and physical therapy.probable adhesive capsulitis-resolved        16. Cystic neck mass-likely an inflamed sebaceous cyst.    Disposition:  He continues "maintenance "Revlimid for treatment of multiple myeloma. He will return as scheduled for Zometa and restaging myeloma labs in January.  He appears to have an inflamed sebaceous cyst at the posterior neck. Mr. Godshall is highly amenable compromise and has a history of recurrent infections. I will refer him to surgery to consider an incision/drainage procedure and antibiotics. We will check a CBC today.  Mr. Stoudt will return for a scheduled followup in January. He will contact us if the neck lesion does not resolve.   Thornton Papas,  MD  06/16/2013  8:58 AM

## 2013-06-16 NOTE — Patient Instructions (Signed)
Remove packing in 2 days then shower and get this wet.  Take antibiotics until they are done.  Will need to keep this covered when draining.

## 2013-06-21 ENCOUNTER — Encounter (INDEPENDENT_AMBULATORY_CARE_PROVIDER_SITE_OTHER): Payer: Self-pay | Admitting: General Surgery

## 2013-06-21 ENCOUNTER — Ambulatory Visit (INDEPENDENT_AMBULATORY_CARE_PROVIDER_SITE_OTHER): Payer: BC Managed Care – PPO | Admitting: General Surgery

## 2013-06-21 VITALS — BP 130/84 | HR 64 | Temp 97.8°F | Resp 14 | Ht 72.0 in | Wt 207.0 lb

## 2013-06-21 DIAGNOSIS — Z09 Encounter for follow-up examination after completed treatment for conditions other than malignant neoplasm: Secondary | ICD-10-CM

## 2013-06-21 NOTE — Progress Notes (Signed)
Subjective:     Patient ID: Chad [name], male   DOB: 1955-08-22, 57 y.o.   MRN: [id number]  HPI 42 yom with multiple myeloma who underwent incision and drainage of posterior neck sebaceous cyst last week. He has removed his packing. He has no complaints today. This is now healed and is not draining any further. He denies any fevers. He has no pain at the site.  Review of Systems     Objective:   Physical Exam  there is still a residual  1 cm centimeter by 1 cm sebaceous cyst with resolved infection    Assessment:     Status post sebaceous cyst incision and drainage     Plan:      I told him to come back to see me in about 4-6 weeks. He still has a residual sebaceous cyst and we may need to talk about excising this due to his risk of infection again. If he has any trouble before then he will call back.

## 2013-06-23 ENCOUNTER — Other Ambulatory Visit: Payer: Self-pay | Admitting: *Deleted

## 2013-06-23 ENCOUNTER — Telehealth: Payer: Self-pay | Admitting: *Deleted

## 2013-06-23 MED ORDER — SULFAMETHOXAZOLE-TRIMETHOPRIM 800-160 MG PO TABS: 1.0000 | ORAL_TABLET | Freq: Two times a day (BID) | ORAL | Status: DC

## 2013-06-23 NOTE — Telephone Encounter (Signed)
Notified pt per Dr. Truett Perna to start Bactrim DS take 1 tablet twice a day for 7 days; also call office if Temp 100.5 or greater or SOB.  Pt verbalized understanding and expressed appreciation for call back.

## 2013-06-23 NOTE — Telephone Encounter (Signed)
Received call from pt stating "I have a lot of drainage down the back of my throat; Temp 99.5; coughing"  Pt denies diarrhea, eating/drinking well "I always have problems with pneumonia and want to prevent it..what should I take?"  Note to Dr. Truett Perna.

## 2013-06-24 ENCOUNTER — Telehealth: Payer: Self-pay | Admitting: *Deleted

## 2013-06-24 ENCOUNTER — Ambulatory Visit (HOSPITAL_BASED_OUTPATIENT_CLINIC_OR_DEPARTMENT_OTHER): Payer: BC Managed Care – PPO | Admitting: Oncology

## 2013-06-24 ENCOUNTER — Ambulatory Visit (HOSPITAL_COMMUNITY)
Admission: RE | Admit: 2013-06-24 | Discharge: 2013-06-24 | Disposition: A | Discharge status: Home / Self Care | Payer: BC Managed Care – PPO | Source: Ambulatory Visit | Attending: Oncology | Admitting: Oncology

## 2013-06-24 ENCOUNTER — Other Ambulatory Visit: Payer: Self-pay | Admitting: Lab

## 2013-06-24 VITALS — BP 135/89 | HR 96 | Temp 100.4°F | Resp 18 | Ht 72.0 in | Wt 204.8 lb

## 2013-06-24 DIAGNOSIS — C9000 Multiple myeloma not having achieved remission: Secondary | ICD-10-CM | POA: Insufficient documentation

## 2013-06-24 DIAGNOSIS — R509 Fever, unspecified: Secondary | ICD-10-CM | POA: Insufficient documentation

## 2013-06-24 DIAGNOSIS — R059 Cough, unspecified: Secondary | ICD-10-CM | POA: Insufficient documentation

## 2013-06-24 DIAGNOSIS — J069 Acute upper respiratory infection, unspecified: Secondary | ICD-10-CM

## 2013-06-24 DIAGNOSIS — R05 Cough: Secondary | ICD-10-CM | POA: Insufficient documentation

## 2013-06-24 MED ORDER — OSELTAMIVIR PHOSPHATE 75 MG PO CAPS: 75.0000 mg | ORAL_CAPSULE | Freq: Two times a day (BID) | ORAL | Status: DC

## 2013-06-24 NOTE — Telephone Encounter (Signed)
Message from pt reporting temperature 101.7. Returned call, pt reports productive cough greenish/ brown sputum. Denies dyspnea. Reviewed with Dr. Truett Perna: Order received to bring pt in for [facility] and office visit. Pt understands to report to radiology, will be worked in to see Dr. Truett Perna after Herby Abraham.

## 2013-06-24 NOTE — Progress Notes (Signed)
[facility]    OFFICE PROGRESS NOTE   INTERVAL HISTORY:   He returns for an unscheduled visit. Dr. Dwain Sarna brain the posterior neck cyst on 06/16/2013. He completed a course of doxycycline. He saw Dr. Dwain Sarna for followup on 06/21/2013.  He reports the onset of a cough and nasal drainage 06/21/2013. He had a low-grade fever yesterday between 99 and 100. This morning he had a temperature of 100.4 after Tylenol. At 2 PM he remission his temperature at 101.7.  He has a cough. No dyspnea. He has malaise. He telephone the office yesterday and was placed on Bactrim. He took a dose last night and this morning.  Objective:  Vital signs in last 24 hours:  Blood pressure 135/89, pulse 96, temperature 100.4 F (38 C), temperature source Oral, resp. rate 18, height 6' (1.829 m), weight 204 lb 12.8 oz (92.897 kg), SpO2 96.00%.    HEENT: No sinus tenderness, oropharynx without erythema or exudate Resp: Few end inspiratory coarse rales at the extreme posterior base bilaterally, good air movement bilaterally, no respiratory distress Cardio: Regular rate and rhythm GI: Nontender, no hepatosplenomegaly Vascular: No leg edema  Lab Results:  Lab Results  Component Value Date   WBC 3.4* 06/16/2013   HGB 12.8* 06/16/2013   HCT 36.0* 06/16/2013   MCV 95.0 06/16/2013   PLT 125* 06/16/2013   ANC 1.6  X-rays: Chest x-ray 06/24/2013-ill-defined area of increased density within the retrocardiac region on the lateral view. No focal regions of consolidation. I reviewed the x-ray images and discuss the x-ray with the radiologist. The radiologist felt the retrocardiac density was most likely scarring or atelectasis with the differential diagnosis including pneumonia.   Medications: I have reviewed the patient's current medications.  Assessment/Plan: 1. Multiple myeloma-status post high-dose chemotherapy with autologous stem cell support in November of 2004. The sero monoclonal  protein was slowly rising and a restaging bone marrow biopsy in November 2009 confirmed 10 to 20% plasma cells. The serum light chains were elevated 07/25/2008. He completed 12 cycles of salvage therapy with Revlimid/Decadron with the last cycle initiated 06/16/2009. On 07/13/2009 the serum free light chains were normal and a serum protein electrophoresis/immunofixation revealed no monoclonal protein. Elevated serum M spike and serum free kappa light chains March 2013. He began cycle 1 of Revlimid 25 mg daily x21 days followed by a 7 day break on 09/27/2011. He began a second cycle of salvage Revlimid beginning on 10/25/2011. The serum M spike was stable and the serum free kappa light chains were slightly more elevated on 11/19/2011. He began another cycle of Revlimid/Decadron on 11/22/2011. Velcade was added beginning on 11/29/2011. He completed 3 cycles of Revlimid/Velcade/Decadron with improvement in the serum M spike/serum free kappa light chains. The last cycle was initiated on 01/20/2012. He received melphalan 200 mg per meter squared on 03/17/2012 with autologous stem cell support on 03/18/2012. Serum immunofixation confirmed a monoclonal IgG kappa protein, too low to quantify and a mild elevation of the serum free kappa light chains on 05/19/2012. He began maintenance Revlimid on 07/11/2012. Serum M spike 0.25, serum free kappa light chains 3.04 on 08/07/2012. Revlimid was placed on hold during the 09/16/2012 hospital admission. Serum M spike 0.45 and serum free kappa light chains 1.83 on 10/21/2012. Revlimid was resumed 11/04/2012. The serum M spike was not detected and the kappa light chains were normal on 03/05/2013. 2. A serum immunofixation confirmed a monoclonal protein and the serum Kappa and Lambda light chains were mildly  elevated in June of 2012  3. History of elevated liver enzymes. 4. Right lung pneumonia June, 2009 -- resolved after treatment with Bactrim and azithromycin. 5. 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 He continues to have intermittent numbness in the feet. 6. History of hypokalemia -he is no longer taking a potassium supplement 7. Pneumonia 01/06/2009 -- requiring hospitalization 01/10/2011 -- status post broad spectrum antibiotic therapy with clinical resolution. 8. Upper respiratory infection in February, 2011 -- likely a viral URI.  9. Right shoulder/upper arm pain. Plain x-ray of the right shoulder/humerus on 10/16/2011 showed 2 small lytic lesions at the proximal humeral shaft suspicious for myeloma involvement. MRI of the right shoulder 10/27/2011 confirmed multiple myelomatous lesions involving the proximal right humerus, scapula, and ribs. A large destructive lesion was noted in the scapular body with an associated soft tissue component. He completed palliative radiation to the right scapula on 11/19/2011 with partial improvement in the shoulder discomfort. 10. "Hemorrhoid" discomfort and urinary hesitancy on 11/15/2011. He appeared to have an ulcer or fissure at the anal verge. Symptoms have resolved. 11. Fever/sepsis/bacteremia/question pneumonia September 2013 following stem cell therapy. 12. Admission with pneumonia 09/16/2012-influenza A was confirmed on a bronchoscopy03/04/2013, he was treated with Tamiflu. 13. Thrombocytopenia-present on hospital admission 09/16/2012-likely related to the recent infection. The thrombocytopenia could also be related to Revlimid, antibiotics, or progression of the multiple myeloma. The platelet count has improved 14. Abundant atypical squamous cells on the bronchial washing cytology from 09/21/2012-most likely secondary to reactive changes, no clinical history to suggest a diagnosis of non-small cell lung cancer.       15. Pain right upper arm. Plain x-ray 11/04/2012 with a 6 mm lytic lesion in the proximal humeral shaft. No fracture or dislocation. Improved following  steroid injections and physical therapy.probable adhesive capsulitis-resolved        16. Cystic neck mass-status post incision and drainage 06/16/2013 followed by doxycycline        17. Fever, nasal drainage, and cough beginning 06/21/2013    Disposition:  Chad [name] has an upper respiratory infection. He most likely has a viral infection, though he is highly immunocompromised and could have bacterial pneumonia. We obtained an influenza nasal swab and sputum culture. He will continue Bactrim. He will begin Tamiflu today. I recommended he push fluids and use Tylenol/ibuprofen for fever. He will contact us with an update on his condition in the a.m. on 06/25/2013. He knows to contact us for shortness of breath. We will have a low threshold for hospital admission if his symptoms do not improve over the next few days.   Thornton Papas, MD  06/24/2013  5:32 PM

## 2013-06-25 ENCOUNTER — Other Ambulatory Visit: Payer: Self-pay | Admitting: Oncology

## 2013-06-25 ENCOUNTER — Telehealth: Payer: Self-pay | Admitting: *Deleted

## 2013-06-25 ENCOUNTER — Ambulatory Visit: Payer: BC Managed Care – PPO

## 2013-06-25 DIAGNOSIS — C9000 Multiple myeloma not having achieved remission: Secondary | ICD-10-CM

## 2013-06-25 LAB — INFLUENZA A AND B
Inflenza A Ag: NEGATIVE
Influenza B Ag: NEGATIVE
Source-INFBD: 0

## 2013-06-25 NOTE — Telephone Encounter (Signed)
Influenza swab is negative. Per Dr. Truett Perna: Continue Tamiflu and antibiotic. Push fluids over the weekend. Pt voiced understanding. Instructed pt to report to ED if he develops dyspnea.

## 2013-06-25 NOTE — Telephone Encounter (Signed)
Call from pt reporting temp last night of 101.7 for which he took Tylenol. Began Tamiflu last night. Reports cough has improved and he notices less post nasal drainage. Temp 100 this morning. His wife dropped off sputum sample this morning. Informed him we will call when nasal swab results are back.

## 2013-06-27 LAB — CULTURE, RESPIRATORY W GRAM STAIN

## 2013-06-29 ENCOUNTER — Telehealth: Payer: Self-pay | Admitting: *Deleted

## 2013-06-29 NOTE — Telephone Encounter (Signed)
Message copied by Caleb Popp on Tue Jun 29, 2013 10:30 AM ------      Message from: Thornton Papas B      Created: Mon Jun 28, 2013  8:20 PM       Please call patient, culture is negative, shows normal flora, complete course of bactrim, call if symptoms are not better ------

## 2013-06-29 NOTE — Telephone Encounter (Signed)
Called pt with sputum culture results. He voiced understanding, appreciation for call.

## 2013-07-01 ENCOUNTER — Ambulatory Visit: Payer: BC Managed Care – PPO | Admitting: Oncology

## 2013-07-19 ENCOUNTER — Other Ambulatory Visit: Payer: Self-pay | Admitting: *Deleted

## 2013-07-19 DIAGNOSIS — C9000 Multiple myeloma not having achieved remission: Secondary | ICD-10-CM

## 2013-07-19 MED ORDER — LENALIDOMIDE 10 MG PO CAPS: 10.0000 mg | ORAL_CAPSULE | Freq: Every day | ORAL | Status: DC

## 2013-08-03 ENCOUNTER — Encounter (INDEPENDENT_AMBULATORY_CARE_PROVIDER_SITE_OTHER): Payer: BC Managed Care – PPO | Admitting: General Surgery

## 2013-08-09 ENCOUNTER — Ambulatory Visit (HOSPITAL_BASED_OUTPATIENT_CLINIC_OR_DEPARTMENT_OTHER): Payer: BC Managed Care – PPO | Admitting: Oncology

## 2013-08-09 ENCOUNTER — Telehealth: Payer: Self-pay | Admitting: *Deleted

## 2013-08-09 ENCOUNTER — Ambulatory Visit (HOSPITAL_BASED_OUTPATIENT_CLINIC_OR_DEPARTMENT_OTHER): Payer: BC Managed Care – PPO

## 2013-08-09 ENCOUNTER — Other Ambulatory Visit (HOSPITAL_BASED_OUTPATIENT_CLINIC_OR_DEPARTMENT_OTHER): Payer: BC Managed Care – PPO

## 2013-08-09 ENCOUNTER — Telehealth: Payer: Self-pay | Admitting: Oncology

## 2013-08-09 ENCOUNTER — Other Ambulatory Visit: Payer: Self-pay | Admitting: *Deleted

## 2013-08-09 VITALS — BP 163/83 | HR 76 | Temp 98.0°F | Resp 20 | Ht 72.0 in | Wt 205.3 lb

## 2013-08-09 DIAGNOSIS — C9000 Multiple myeloma not having achieved remission: Secondary | ICD-10-CM

## 2013-08-09 DIAGNOSIS — D649 Anemia, unspecified: Secondary | ICD-10-CM

## 2013-08-09 DIAGNOSIS — Z23 Encounter for immunization: Secondary | ICD-10-CM

## 2013-08-09 LAB — TECHNOLOGIST REVIEW

## 2013-08-09 LAB — COMPREHENSIVE METABOLIC PANEL (CC13)
ALT: 17 U/L (ref 0–55)
AST: 13 U/L (ref 5–34)
Albumin: 4 g/dL (ref 3.5–5.0)
Alkaline Phosphatase: 51 U/L (ref 40–150)
Anion Gap: 10 mEq/L (ref 3–11)
BUN: 9.7 mg/dL (ref 7.0–26.0)
CHLORIDE: 109 meq/L (ref 98–109)
CO2: 23 meq/L (ref 22–29)
Calcium: 9.1 mg/dL (ref 8.4–10.4)
Creatinine: 0.9 mg/dL (ref 0.7–1.3)
GLUCOSE: 109 mg/dL (ref 70–140)
Potassium: 3.5 mEq/L (ref 3.5–5.1)
SODIUM: 141 meq/L (ref 136–145)
Total Bilirubin: 1 mg/dL (ref 0.20–1.20)
Total Protein: 7 g/dL (ref 6.4–8.3)

## 2013-08-09 LAB — CBC WITH DIFFERENTIAL/PLATELET
BASO%: 2 % (ref 0.0–2.0)
Basophils Absolute: 0.1 10*3/uL (ref 0.0–0.1)
EOS%: 4.6 % (ref 0.0–7.0)
Eosinophils Absolute: 0.1 10*3/uL (ref 0.0–0.5)
HCT: 31.8 % — ABNORMAL LOW (ref 38.4–49.9)
HGB: 11.3 g/dL — ABNORMAL LOW (ref 13.0–17.1)
LYMPH%: 27.2 % (ref 14.0–49.0)
MCH: 33.6 pg — AB (ref 27.2–33.4)
MCHC: 35.5 g/dL (ref 32.0–36.0)
MCV: 94.6 fL (ref 79.3–98.0)
MONO#: 0.3 10*3/uL (ref 0.1–0.9)
MONO%: 8.5 % (ref 0.0–14.0)
NEUT#: 1.8 10*3/uL (ref 1.5–6.5)
NEUT%: 57.7 % (ref 39.0–75.0)
PLATELETS: 101 10*3/uL — AB (ref 140–400)
RBC: 3.36 10*6/uL — ABNORMAL LOW (ref 4.20–5.82)
RDW: 15.3 % — ABNORMAL HIGH (ref 11.0–14.6)
WBC: 3.1 10*3/uL — AB (ref 4.0–10.3)
lymph#: 0.8 10*3/uL — ABNORMAL LOW (ref 0.9–3.3)
nRBC: 4 % — ABNORMAL HIGH (ref 0–0)

## 2013-08-09 MED ORDER — PNEUMOCOCCAL 13-VAL CONJ VACC IM SUSP
0.5000 mL | INTRAMUSCULAR | Status: AC
  Administered 2013-08-09: 0.5 mL via INTRAMUSCULAR
  Filled 2013-08-09: qty 0.5

## 2013-08-09 MED ORDER — HAEMOPHILUS B POLYSAC CONJ VAC IM SOLR
0.5000 mL | Freq: Once | INTRAMUSCULAR | Status: AC
  Administered 2013-08-09: 0.5 mL via INTRAMUSCULAR
  Filled 2013-08-09: qty 0.5

## 2013-08-09 MED ORDER — SODIUM CHLORIDE 0.9 % IV SOLN
Freq: Once | INTRAVENOUS | Status: AC
  Administered 2013-08-09: 11:00:00 via INTRAVENOUS

## 2013-08-09 MED ORDER — ZOLEDRONIC ACID 4 MG/100ML IV SOLN
4.0000 mg | Freq: Once | INTRAVENOUS | Status: AC
  Administered 2013-08-09: 4 mg via INTRAVENOUS
  Filled 2013-08-09: qty 100

## 2013-08-09 MED ORDER — DTAP-HEPATITIS B RECOMB-IPV IM SUSP
0.5000 mL | Freq: Once | INTRAMUSCULAR | Status: AC
  Administered 2013-08-09: 0.5 mL via INTRAMUSCULAR
  Filled 2013-08-09: qty 0.5

## 2013-08-09 NOTE — Telephone Encounter (Signed)
Per staff message and POF I have scheduled appts.  JMW  

## 2013-08-09 NOTE — Progress Notes (Signed)
[facility]    OFFICE PROGRESS NOTE   INTERVAL HISTORY:   He returns for scheduled followup of multiple myeloma. He continues Revlimid. No recent infection. Intermittent "cramping" of the toes. The necks cyst has healed. No recurrent infection since he was seen with an upper respiratory infection last month.  Objective:  Vital signs in last 24 hours:  Blood pressure 163/83, pulse 76, temperature 98 F (36.7 C), temperature source Oral, resp. rate 20, height 6' (1.829 m), weight 205 lb 4.8 oz (93.123 kg).    HEENT: No thrush or ulcers Resp: Lungs clear bilaterally Cardio: Regular rate and rhythm GI: No hepatosplenomegaly Vascular: No leg edema   Lab Results:  Lab Results  Component Value Date   WBC 3.1* 08/09/2013   HGB 11.3* 08/09/2013   HCT 31.8* 08/09/2013   MCV 94.6 08/09/2013   PLT 101* 08/09/2013   NEUTROABS 1.8 08/09/2013      Medications: I have reviewed the patient's current medications.  Assessment/Plan: 1. Multiple myeloma-status post high-dose chemotherapy with autologous stem cell support in November of 2004. The sero monoclonal protein was slowly rising and a restaging bone marrow biopsy in November 2009 confirmed 10 to 20% plasma cells. The serum light chains were elevated 07/25/2008. He completed 12 cycles of salvage therapy with Revlimid/Decadron with the last cycle initiated 06/16/2009. On 07/13/2009 the serum free light chains were normal and a serum protein electrophoresis/immunofixation revealed no monoclonal protein. Elevated serum M spike and serum free kappa light chains March 2013. He began cycle 1 of Revlimid 25 mg daily x21 days followed by a 7 day break on 09/27/2011. He began a second cycle of salvage Revlimid beginning on 10/25/2011. The serum M spike was stable and the serum free kappa light chains were slightly more elevated on 11/19/2011. He began another cycle of Revlimid/Decadron on 11/22/2011. Velcade was added beginning on  11/29/2011. He completed 3 cycles of Revlimid/Velcade/Decadron with improvement in the serum M spike/serum free kappa light chains. The last cycle was initiated on 01/20/2012. He received melphalan 200 mg per meter squared on 03/17/2012 with autologous stem cell support on 03/18/2012. Serum immunofixation confirmed a monoclonal IgG kappa protein, too low to quantify and a mild elevation of the serum free kappa light chains on 05/19/2012. He began maintenance Revlimid on 07/11/2012. Serum M spike 0.25, serum free kappa light chains 3.04 on 08/07/2012. Revlimid was placed on hold during the 09/16/2012 hospital admission. Serum M spike 0.45 and serum free kappa light chains 1.83 on 10/21/2012. Revlimid was resumed 11/04/2012. The serum M spike was not detected and the kappa light chains were mildly elevated 05/03/2013 2. A serum immunofixation confirmed a monoclonal protein and the serum Kappa and Lambda light chains were mildly elevated in June of 2012  3. History of elevated liver enzymes. 4. Right lung pneumonia June, 2009 -- resolved after treatment with Bactrim and azithromycin. 5. 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 He continues to have intermittent numbness in the feet. 6. History of hypokalemia -he is no longer taking a potassium supplement 7. Pneumonia 01/06/2009 -- requiring hospitalization 01/10/2011 -- status post broad spectrum antibiotic therapy with clinical resolution. 8. Upper respiratory infection in February, 2011 -- likely a viral URI.  9. Right shoulder/upper arm pain. Plain x-ray of the right shoulder/humerus on 10/16/2011 showed 2 small lytic lesions at the proximal humeral shaft suspicious for myeloma involvement. MRI of the right shoulder 10/27/2011 confirmed multiple myelomatous lesions  involving the proximal right humerus, scapula, and ribs. A large destructive lesion was noted in the scapular body with an associated soft  tissue component. He completed palliative radiation to the right scapula on 11/19/2011 with partial improvement in the shoulder discomfort. 10. "Hemorrhoid" discomfort and urinary hesitancy on 11/15/2011. He appeared to have an ulcer or fissure at the anal verge. Symptoms have resolved. 11. Fever/sepsis/bacteremia/question pneumonia September 2013 following stem cell therapy. 12. Admission with pneumonia 09/16/2012-influenza A was confirmed on a bronchoscopy03/04/2013, he was treated with Tamiflu. 49. Thrombocytopenia-present on hospital admission 09/16/2012-likely related to the recent infection. The thrombocytopenia could also be related to Revlimid, antibiotics, or progression of the multiple myeloma. The platelet count has improved 14. Abundant atypical squamous cells on the bronchial washing cytology from 09/21/2012-most likely secondary to reactive changes, no clinical history to suggest a diagnosis of non-small cell lung cancer. 15. Pain right upper arm. Plain x-ray 11/04/2012 with a 6 mm lytic lesion in the proximal humeral shaft. No fracture or dislocation. Improved following steroid injections and physical therapy.probable adhesive capsulitis-resolved  16. Cystic neck mass-status post incision and drainage 06/16/2013 followed by doxycycline  17. mild anemia-? Related to myeloma versus Revlimid    Disposition:  Mr. Shepherd appears stable. He will continue maintenance Revlimid. He will receive Zometa and vaccinations today. Mr. Matters will return for a CBC in one month. We will followup on the serum protein electrophoresis and free light chains from today. He will return for an office visit and Zometa in 3 months.   Betsy Coder, MD  08/09/2013  4:07 PM

## 2013-08-09 NOTE — Telephone Encounter (Signed)
Gave pt appt for lab and MD for February and April 2015 emailed Sharyn Lull reagrding chemo

## 2013-08-09 NOTE — Patient Instructions (Signed)
Zoledronic Acid injection (Hypercalcemia, Oncology) What is this medicine? ZOLEDRONIC ACID (ZOE le dron ik AS id) lowers the amount of calcium loss from bone. It is used to treat too much calcium in your blood from cancer. It is also used to prevent complications of cancer that has spread to the bone. This medicine may be used for other purposes; ask your health care provider or pharmacist if you have questions. What should I tell my health care provider before I take this medicine? They need to know if you have any of these conditions: -aspirin-sensitive asthma -dental disease -kidney disease -an unusual or allergic reaction to zoledronic acid, other medicines, foods, dyes, or preservatives -pregnant or trying to get pregnant -breast-feeding How should I use this medicine? This medicine is for infusion into a vein. It is given by a health care professional in a hospital or clinic setting. Talk to your pediatrician regarding the use of this medicine in children. Special care may be needed. Overdosage: If you think you have taken too much of this medicine contact a poison control center or emergency room at once. NOTE: This medicine is only for you. Do not share this medicine with others. What if I miss a dose? It is important not to miss your dose. Call your doctor or health care professional if you are unable to keep an appointment. What may interact with this medicine? -certain antibiotics given by injection -NSAIDs, medicines for pain and inflammation, like ibuprofen or naproxen -some diuretics like bumetanide, furosemide -teriparatide -thalidomide This list may not describe all possible interactions. Give your health care provider a list of all the medicines, herbs, non-prescription drugs, or dietary supplements you use. Also tell them if you smoke, drink alcohol, or use illegal drugs. Some items may interact with your medicine. What should I watch for while using this medicine? Visit  your doctor or health care professional for regular checkups. It may be some time before you see the benefit from this medicine. Do not stop taking your medicine unless your doctor tells you to. Your doctor may order blood tests or other tests to see how you are doing. Women should inform their doctor if they wish to become pregnant or think they might be pregnant. There is a potential for serious side effects to an unborn child. Talk to your health care professional or pharmacist for more information. You should make sure that you get enough calcium and vitamin D while you are taking this medicine. Discuss the foods you eat and the vitamins you take with your health care professional. Some people who take this medicine have severe bone, joint, and/or muscle pain. This medicine may also increase your risk for a broken thigh bone. Tell your doctor right away if you have pain in your upper leg or groin. Tell your doctor if you have any pain that does not go away or that gets worse. What side effects may I notice from receiving this medicine? Side effects that you should report to your doctor or health care professional as soon as possible: -allergic reactions like skin rash, itching or hives, swelling of the face, lips, or tongue -anxiety, confusion, or depression -breathing problems -changes in vision -feeling faint or lightheaded, falls -jaw burning, cramping, pain -muscle cramps, stiffness, or weakness -trouble passing urine or change in the amount of urine Side effects that usually do not require medical attention (report to your doctor or health care professional if they continue or are bothersome): -bone, joint, or muscle pain -  fever -hair loss -irritation at site where injected -loss of appetite -nausea, vomiting -stomach upset -tired This list may not describe all possible side effects. Call your doctor for medical advice about side effects. You may report side effects to FDA at  1-800-FDA-1088. Where should I keep my medicine? This drug is given in a hospital or clinic and will not be stored at home. NOTE: This sheet is a summary. It may not cover all possible information. If you have questions about this medicine, talk to your doctor, pharmacist, or health care provider.  2013, [employer]vier/[employer]. (12/28/2010 9:06:58 AM)

## 2013-08-11 ENCOUNTER — Telehealth: Payer: Self-pay | Admitting: Oncology

## 2013-08-11 LAB — PROTEIN ELECTROPHORESIS, SERUM
ALPHA-2-GLOBULIN: 11.8 % (ref 7.1–11.8)
Albumin ELP: 57 % (ref 55.8–66.1)
Alpha-1-Globulin: 5.8 % — ABNORMAL HIGH (ref 2.9–4.9)
BETA 2: 8.7 % — AB (ref 3.2–6.5)
Beta Globulin: 6.1 % (ref 4.7–7.2)
GAMMA GLOBULIN: 10.6 % — AB (ref 11.1–18.8)
Total Protein, Serum Electrophoresis: 6.7 g/dL (ref 6.0–8.3)

## 2013-08-11 LAB — KAPPA/LAMBDA LIGHT CHAINS
Kappa free light chain: 5.51 mg/dL — ABNORMAL HIGH (ref 0.33–1.94)
Kappa:Lambda Ratio: 2.12 — ABNORMAL HIGH (ref 0.26–1.65)
Lambda Free Lght Chn: 2.6 mg/dL (ref 0.57–2.63)

## 2013-08-11 NOTE — Telephone Encounter (Signed)
Talked to pt and he has all appts for February and April appts

## 2013-08-26 ENCOUNTER — Other Ambulatory Visit: Payer: Self-pay | Admitting: *Deleted

## 2013-08-26 DIAGNOSIS — C9000 Multiple myeloma not having achieved remission: Secondary | ICD-10-CM

## 2013-08-26 MED ORDER — LENALIDOMIDE 10 MG PO CAPS: 10.0000 mg | ORAL_CAPSULE | Freq: Every day | ORAL | Status: DC

## 2013-09-06 ENCOUNTER — Other Ambulatory Visit (HOSPITAL_BASED_OUTPATIENT_CLINIC_OR_DEPARTMENT_OTHER): Payer: BC Managed Care – PPO

## 2013-09-06 DIAGNOSIS — C9000 Multiple myeloma not having achieved remission: Secondary | ICD-10-CM

## 2013-09-06 LAB — CBC WITH DIFFERENTIAL/PLATELET
BASO%: 1.8 % (ref 0.0–2.0)
BASOS ABS: 0.1 10*3/uL (ref 0.0–0.1)
EOS ABS: 0.2 10*3/uL (ref 0.0–0.5)
EOS%: 6.9 % (ref 0.0–7.0)
HEMATOCRIT: 28.5 % — AB (ref 38.4–49.9)
HEMOGLOBIN: 9.8 g/dL — AB (ref 13.0–17.1)
LYMPH%: 20.6 % (ref 14.0–49.0)
MCH: 32.3 pg (ref 27.2–33.4)
MCHC: 34.4 g/dL (ref 32.0–36.0)
MCV: 94.1 fL (ref 79.3–98.0)
MONO#: 0.4 10*3/uL (ref 0.1–0.9)
MONO%: 13.4 % (ref 0.0–14.0)
NEUT%: 57.3 % (ref 39.0–75.0)
NEUTROS ABS: 1.6 10*3/uL (ref 1.5–6.5)
NRBC: 9 % — AB (ref 0–0)
Platelets: 88 10*3/uL — ABNORMAL LOW (ref 140–400)
RBC: 3.03 10*6/uL — AB (ref 4.20–5.82)
RDW: 16 % — ABNORMAL HIGH (ref 11.0–14.6)
WBC: 2.8 10*3/uL — AB (ref 4.0–10.3)
lymph#: 0.6 10*3/uL — ABNORMAL LOW (ref 0.9–3.3)

## 2013-09-06 LAB — BASIC METABOLIC PANEL (CC13)
Anion Gap: 9 mEq/L (ref 3–11)
BUN: 11.6 mg/dL (ref 7.0–26.0)
CHLORIDE: 111 meq/L — AB (ref 98–109)
CO2: 22 mEq/L (ref 22–29)
Calcium: 8.9 mg/dL (ref 8.4–10.4)
Creatinine: 0.8 mg/dL (ref 0.7–1.3)
GLUCOSE: 103 mg/dL (ref 70–140)
Potassium: 3.5 mEq/L (ref 3.5–5.1)
Sodium: 142 mEq/L (ref 136–145)

## 2013-09-06 LAB — TECHNOLOGIST REVIEW

## 2013-09-07 ENCOUNTER — Telehealth: Payer: Self-pay | Admitting: *Deleted

## 2013-09-07 ENCOUNTER — Other Ambulatory Visit: Payer: Self-pay | Admitting: *Deleted

## 2013-09-07 DIAGNOSIS — C9000 Multiple myeloma not having achieved remission: Secondary | ICD-10-CM

## 2013-09-07 NOTE — Telephone Encounter (Signed)
Spoke with Jeanne Ivan YC:XKGY. MD unsure if Hgb decrease is due to Revlimid or his myeloma. Wanted to check light chains from sample drawn 2/23-unable to do this/different tube per lab. Will recheck labs in 2 weeks. Scheduled with him.

## 2013-09-07 NOTE — Telephone Encounter (Signed)
Message copied by [name] on Tue Sep 07, 2013 10:19 AM ------      Message from: Betsy Coder B      Created: Mon Sep 06, 2013  9:40 PM       Please call patient, hb is lower, add serum light chains to 2/23 labs, repeat cbc 2 weeks, hold revlimid ------

## 2013-09-20 ENCOUNTER — Other Ambulatory Visit (HOSPITAL_BASED_OUTPATIENT_CLINIC_OR_DEPARTMENT_OTHER): Payer: BC Managed Care – PPO

## 2013-09-20 DIAGNOSIS — C9000 Multiple myeloma not having achieved remission: Secondary | ICD-10-CM

## 2013-09-20 LAB — CBC WITH DIFFERENTIAL/PLATELET
BASO%: 1.1 % (ref 0.0–2.0)
Basophils Absolute: 0 10*3/uL (ref 0.0–0.1)
EOS ABS: 0.1 10*3/uL (ref 0.0–0.5)
EOS%: 3.5 % (ref 0.0–7.0)
HEMATOCRIT: 27.3 % — AB (ref 38.4–49.9)
HGB: 9.3 g/dL — ABNORMAL LOW (ref 13.0–17.1)
LYMPH%: 18.4 % (ref 14.0–49.0)
MCH: 31.8 pg (ref 27.2–33.4)
MCHC: 34.1 g/dL (ref 32.0–36.0)
MCV: 93.5 fL (ref 79.3–98.0)
MONO#: 0.4 10*3/uL (ref 0.1–0.9)
MONO%: 10.8 % (ref 0.0–14.0)
NEUT%: 66.2 % (ref 39.0–75.0)
NEUTROS ABS: 2.5 10*3/uL (ref 1.5–6.5)
Platelets: 60 10*3/uL — ABNORMAL LOW (ref 140–400)
RBC: 2.92 10*6/uL — ABNORMAL LOW (ref 4.20–5.82)
RDW: 16.1 % — ABNORMAL HIGH (ref 11.0–14.6)
WBC: 3.7 10*3/uL — AB (ref 4.0–10.3)
lymph#: 0.7 10*3/uL — ABNORMAL LOW (ref 0.9–3.3)
nRBC: 7 % — ABNORMAL HIGH (ref 0–0)

## 2013-09-21 ENCOUNTER — Telehealth: Payer: Self-pay | Admitting: *Deleted

## 2013-09-21 NOTE — Telephone Encounter (Signed)
Left message on voicemail instructing pt to HOLD Revlimid.

## 2013-09-21 NOTE — Telephone Encounter (Signed)
Message copied by Brien Few on Tue Sep 21, 2013  5:02 PM ------      Message from: Betsy Coder B      Created: Mon Sep 20, 2013 10:09 PM       Please call patient, hold revlimid, hb and platelets are lower. We will f/u on light chains and decide on further evaluation of the MM ------

## 2013-09-22 LAB — KAPPA/LAMBDA LIGHT CHAINS
KAPPA LAMBDA RATIO: 3.75 — AB (ref 0.26–1.65)
Kappa free light chain: 7.88 mg/dL — ABNORMAL HIGH (ref 0.33–1.94)
Lambda Free Lght Chn: 2.1 mg/dL (ref 0.57–2.63)

## 2013-09-22 NOTE — Telephone Encounter (Signed)
VM confirming he got our message to hold Revlimid. Anxious to hear light chain results when available.

## 2013-09-24 ENCOUNTER — Other Ambulatory Visit: Payer: Self-pay | Admitting: *Deleted

## 2013-09-24 ENCOUNTER — Telehealth: Payer: Self-pay | Admitting: *Deleted

## 2013-09-24 DIAGNOSIS — C9000 Multiple myeloma not having achieved remission: Secondary | ICD-10-CM

## 2013-09-24 NOTE — Telephone Encounter (Signed)
VM from patient with dates/times he prefers for his bone survey and bone marrow biopsy to allow him to continue to work. Also wants to have CBC checked.  March 17th-after 11am March 20th-after 12pm March 23, 26 or 27-any time that day March 25th-in pm. Noted this on the order requests.

## 2013-09-24 NOTE — Telephone Encounter (Signed)
Spoke with Chad [name] regarding light chain results increasing and Dr. Benay Spice response: needs bone survey and referral to IR for bone marrow biopsy now, other option is to hold revlimid and see if counts get better in 2 weeks. Blu wishes to have bone survey and bone marrow-he call call back and let us know some days that are most convenient for him. RN instructed him to hold his Revlimid for now.

## 2013-09-26 ENCOUNTER — Telehealth: Payer: Self-pay | Admitting: Oncology

## 2013-09-26 NOTE — Telephone Encounter (Signed)
[facility] will call pt re bx and bone survey. no other orders per 3/13 pof.

## 2013-10-04 ENCOUNTER — Other Ambulatory Visit: Payer: Self-pay | Admitting: Radiology

## 2013-10-07 ENCOUNTER — Ambulatory Visit (HOSPITAL_COMMUNITY)
Admission: RE | Admit: 2013-10-07 | Discharge: 2013-10-07 | Disposition: A | Discharge status: Home / Self Care | Payer: BC Managed Care – PPO | Source: Ambulatory Visit | Attending: Oncology | Admitting: Oncology

## 2013-10-07 ENCOUNTER — Encounter (HOSPITAL_COMMUNITY): Payer: Self-pay

## 2013-10-07 ENCOUNTER — Encounter (HOSPITAL_COMMUNITY): Payer: Self-pay | Admitting: Pharmacy Technician

## 2013-10-07 DIAGNOSIS — D649 Anemia, unspecified: Secondary | ICD-10-CM | POA: Insufficient documentation

## 2013-10-07 DIAGNOSIS — D696 Thrombocytopenia, unspecified: Secondary | ICD-10-CM | POA: Insufficient documentation

## 2013-10-07 DIAGNOSIS — C9000 Multiple myeloma not having achieved remission: Secondary | ICD-10-CM | POA: Insufficient documentation

## 2013-10-07 DIAGNOSIS — Z7982 Long term (current) use of aspirin: Secondary | ICD-10-CM | POA: Insufficient documentation

## 2013-10-07 DIAGNOSIS — Z9484 Stem cells transplant status: Secondary | ICD-10-CM | POA: Insufficient documentation

## 2013-10-07 DIAGNOSIS — Z79899 Other long term (current) drug therapy: Secondary | ICD-10-CM | POA: Insufficient documentation

## 2013-10-07 DIAGNOSIS — Z807 Family history of other malignant neoplasms of lymphoid, hematopoietic and related tissues: Secondary | ICD-10-CM | POA: Insufficient documentation

## 2013-10-07 DIAGNOSIS — I2510 Atherosclerotic heart disease of native coronary artery without angina pectoris: Secondary | ICD-10-CM | POA: Insufficient documentation

## 2013-10-07 DIAGNOSIS — Z01812 Encounter for preprocedural laboratory examination: Secondary | ICD-10-CM | POA: Insufficient documentation

## 2013-10-07 LAB — CBC
HEMATOCRIT: 28.4 % — AB (ref 39.0–52.0)
HEMOGLOBIN: 9.9 g/dL — AB (ref 13.0–17.0)
MCH: 32.8 pg (ref 26.0–34.0)
MCHC: 34.9 g/dL (ref 30.0–36.0)
MCV: 94 fL (ref 78.0–100.0)
Platelets: 67 10*3/uL — ABNORMAL LOW (ref 150–400)
RBC: 3.02 MIL/uL — ABNORMAL LOW (ref 4.22–5.81)
RDW: 16.1 % — ABNORMAL HIGH (ref 11.5–15.5)
WBC: 4.3 10*3/uL (ref 4.0–10.5)

## 2013-10-07 LAB — APTT: aPTT: 27 seconds (ref 24–37)

## 2013-10-07 LAB — PROTIME-INR
INR: 0.96 (ref 0.00–1.49)
PROTHROMBIN TIME: 12.6 s (ref 11.6–15.2)

## 2013-10-07 LAB — BONE MARROW EXAM

## 2013-10-07 MED ORDER — MIDAZOLAM HCL 2 MG/2ML IJ SOLN
INTRAMUSCULAR | Status: AC
  Filled 2013-10-07: qty 6

## 2013-10-07 MED ORDER — FENTANYL CITRATE 0.05 MG/ML IJ SOLN
INTRAMUSCULAR | Status: AC | PRN
  Administered 2013-10-07: 25 ug via INTRAVENOUS
  Administered 2013-10-07: 50 ug via INTRAVENOUS

## 2013-10-07 MED ORDER — MIDAZOLAM HCL 2 MG/2ML IJ SOLN
INTRAMUSCULAR | Status: AC | PRN
  Administered 2013-10-07: 0.5 mg via INTRAVENOUS
  Administered 2013-10-07: 1 mg via INTRAVENOUS

## 2013-10-07 MED ORDER — FENTANYL CITRATE 0.05 MG/ML IJ SOLN
INTRAMUSCULAR | Status: AC
  Filled 2013-10-07: qty 6

## 2013-10-07 MED ORDER — SODIUM CHLORIDE 0.9 % IV SOLN
INTRAVENOUS | Status: DC
Start: 2013-10-07 — End: 2013-10-08
  Administered 2013-10-07: 09:00:00 via INTRAVENOUS

## 2013-10-07 NOTE — Procedures (Signed)
Successful CT RT ILIAC BM ASP AND CORE BX NO COMP STABLE FULL REPORT IN PACS

## 2013-10-07 NOTE — Discharge Instructions (Signed)
Conscious Sedation, Adult, Care After °Refer to this sheet in the next few weeks. These instructions provide you with information on caring for yourself after your procedure. Your health care provider may also give you more specific instructions. Your treatment has been planned according to current medical practices, but problems sometimes occur. Call your health care provider if you have any problems or questions after your procedure. °WHAT TO EXPECT AFTER THE PROCEDURE  °After your procedure: °· You may feel sleepy, clumsy, and have poor balance for several hours. °· Vomiting may occur if you eat too soon after the procedure. °HOME CARE INSTRUCTIONS °· Do not participate in any activities where you could become injured for at least 24 hours. Do not: °· Drive. °· Swim. °· Ride a bicycle. °· Operate heavy machinery. °· Cook. °· Use power tools. °· Climb ladders. °· Work from a high place. °· Do not make important decisions or sign legal documents until you are improved. °· If you vomit, drink water, juice, or soup when you can drink without vomiting. Make sure you have little or no nausea before eating solid foods. °· Only take over-the-counter or prescription medicines for pain, discomfort, or fever as directed by your health care provider. °· Make sure you and your family fully understand everything about the medicines given to you, including what side effects may occur. °· You should not drink alcohol, take sleeping pills, or take medicines that cause drowsiness for at least 24 hours. °· If you smoke, do not smoke without supervision. °· If you are feeling better, you may resume normal activities 24 hours after you were sedated. °· Keep all appointments with your health care provider. °SEEK MEDICAL CARE IF: °· Your skin is pale or bluish in color. °· You continue to feel nauseous or vomit. °· Your pain is getting worse and is not helped by medicine. °· You have bleeding or swelling. °· You are still sleepy or  feeling clumsy after 24 hours. °SEEK IMMEDIATE MEDICAL CARE IF: °· You develop a rash. °· You have difficulty breathing. °· You develop any type of allergic problem. °· You have a fever. °MAKE SURE YOU: °· Understand these instructions. °· Will watch your condition. °· Will get help right away if you are not doing well or get worse. °Document Released: 04/21/2013 Document Reviewed: 02/05/2013 °ExitCare® Patient Information ©2014 [employer]. °Bone Marrow Aspiration, Bone Marrow Biopsy °Care After °Read the instructions outlined below and refer to this sheet in the next few weeks. These discharge instructions provide you with general information on caring for yourself after you leave the hospital. Your caregiver may also give you specific instructions. While your treatment has been planned according to the most current medical practices available, unavoidable complications occasionally occur. If you have any problems or questions after discharge, call your caregiver. °FINDING OUT THE RESULTS OF YOUR TEST °Not all test results are available during your visit. If your test results are not back during the visit, make an appointment with your caregiver to find out the results. Do not assume everything is normal if you have not heard from your caregiver or the medical facility. It is important for you to follow up on all of your test results.  °HOME CARE INSTRUCTIONS  °You have had sedation and may be sleepy or dizzy. Your thinking may not be as clear as usual. For the next 24 hours: °· Only take over-the-counter or prescription medicines for pain, discomfort, and or fever as directed by your caregiver. °·   Do not drink alcohol. °· Do not smoke. °· Do not drive. °· Do not make important legal decisions. °· Do not operate heavy machinery. °· Do not care for small children by yourself. °· Keep your dressing clean and dry. You may replace dressing with a bandage after 24 hours. °· You may take a bath or shower after 24  hours. °· Use an ice pack for 20 minutes every 2 hours while awake for pain as needed. °SEEK MEDICAL CARE IF:  °· There is redness, swelling, or increasing pain at the biopsy site. °· There is pus coming from the biopsy site. °· There is drainage from a biopsy site lasting longer than one day. °· An unexplained oral temperature above 102° F (38.9° C) develops. °SEEK IMMEDIATE MEDICAL CARE IF:  °· You develop a rash. °· You have difficulty breathing. °· You develop any reaction or side effects to medications given. °Document Released: 01/18/2005 Document Revised: 09/23/2011 Document Reviewed: 06/28/2008 °ExitCare® Patient Information ©2014 [employer]. ° °

## 2013-10-07 NOTE — H&P (Signed)
Chad [name] is an 58 y.o. male.   Chief Complaint: "I'm here for a bone marrow biopsy" HPI: Patient with history of multiple myeloma presents today for restaging CT guided bone marrow biopsy.  Past Medical History  Diagnosis Date  . Unspecified essential hypertension   . Coronary atherosclerosis of unspecified type of vessel, native or graft   . Pure hypercholesterolemia   . Overweight   . Benign neoplasm of colon   . Multiple myeloma, without mention of having achieved remission(203.00) 2004  . Urticaria, unspecified   . 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 Cancer 10/26/11 ct    multiple myelomatous lesion proximal right humerus,scaspula and ribs,  . History of chemotherapy 05/2003    high dose chemotherapy with autologus stem cell support/revlimid started 09/27/11  . Pneumonia 12/2007    hx  12/2007 & agian 01/06/09  . Allergy   . URI (upper respiratory infection) 08/2009    viral URI  . Neuropathy     hx intermittement tingling/numbness in feet r/t? revlimid, , resolved  . Neuropathy 10/22/11    intermittent tingling feet  again with revlimid therapy  . H/O cardiac catheterization 07/13/02  . H/O colonoscopy 04/2007    57m desc colon polyp =adenomatous  . [facility]  . Anemia   . Heart murmur     Past Surgical History  Procedure Laterality Date  . 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 Video bronchoscopy Bilateral 09/21/2012    Procedure: VIDEO BRONCHOSCOPY WITHOUT FLUORO;  Surgeon: DWilhelmina Mcardle MD;  Location: [facility];  Service: Cardiopulmonary;  Laterality: Bilateral;    Family History  Problem Relation Age of Onset  . Cancer Father 870   lymphoma   Social History:  reports that he has never smoked. He has never used smokeless tobacco. He reports that he drinks alcohol. He reports that he does not use illicit drugs.  Allergies:  Allergies  Allergen Reactions  .  Primaxin [Imipenem] Rash  . Cephalosporins   . Lisinopril     REACTION: ALLERGIC to ACE w/ cough...  . Moxifloxacin     Swollen lips  . Penicillins     rash  . Valsartan     REACTION: ALLERGIC to ARB w/ Urticaria \T_0 Angioedema    Current outpatient prescriptions:acetaminophen (TYLENOL) 325 MG tablet, Take 650 mg by mouth every 6 (six) hours as needed., Disp: , Rfl: ;  aspirin 81 MG tablet, Take 81 mg by mouth daily., Disp: , Rfl: ;  Calcium 600-200 MG-UNIT per tablet, Take 1 tablet by mouth 2 (two) times daily., Disp: 60 tablet, Rfl: 1;  lenalidomide (REVLIMID) 10 MG capsule, Take 1 capsule (10 mg total) by mouth daily., Disp: 28 capsule, Rfl: 0 Zoledronic Acid (ZOMETA IV), Inject 4 mg into the vein every 3 (three) months. , Disp: , Rfl:  Current facility-administered medications:0.9 %  sodium chloride infusion, , Intravenous, Continuous, KAscencion Dike PA-C, Last Rate: 20 mL/hr at 10/07/13 0910  No results found for this or any previous visit (from the past 48 hour(s)). Dg Bone Survey Met  10/07/2013   CLINICAL DATA:  Multiple myeloma.  EXAM: METASTATIC BONE SURVEY  COMPARISON:  09/18/2012  FINDINGS: No convincing skull lesion on the current study.  Small lucencies are noted in the T5, T8 and T11 thoracic vertebra, with mild loss of disc height of the T8 vertebrae and  mild intervening sclerosis in the T8 in T11 vertebrae. This is stable. No new thoracic lesions.  Lucency with mild adjacent sclerosis is noted in the L5 vertebrae without loss of height. This is also unchanged.  There is a well-defined lucent lesion in the right ilium approximately 3 cm above the roof of the right acetabulum. This has increased from the prior study. A discrete lucent lesion in the inferior right femoral neck is stable a smaller more subtle lesion in the left femoral neck is also stable.  Well-defined lucent lesion in the proximal humerus is stable. There is a smaller more subtle lesion below this which was likely  present previously. It is more equivocal. Well-defined lucent lesion is noted in the proximal left humeral metaphysis, without change. Two tiny additional lesions are noted in the proximal left humerus, 1 of which appears new.  There are subtle bilateral rib lesions that are not well defined due to superimpose lung. These do not appear significantly changed. Fracture of the posterior lateral right seventh rib has healed.  Lungs are now clear.  IMPRESSION: 1. Mild progression of multiple myeloma. A lesion in the right ilium is larger or new since the prior study. One small new lesion is evident in the left proximal humerus. Other lesions along the spine, ribs an proximal femurs and humeri are stable.   Electronically Signed   By: Lajean Manes M.D.   On: 10/07/2013 09:38    Review of Systems  Constitutional: Negative for fever and chills.  Respiratory: Negative for hemoptysis and shortness of breath.        Occ cough  Cardiovascular: Negative for chest pain.  Gastrointestinal: Negative for nausea, vomiting and abdominal pain.  Genitourinary: Negative for hematuria.  Musculoskeletal:       Occ back pain  Neurological: Negative for headaches.  Endo/Heme/Allergies: Does not bruise/bleed easily.   Filed Vitals:   10/07/13 0900  BP: 161/95  Pulse: 78  Temp: 97.2 F (36.2 C)  TempSrc: Oral  Resp: 18  SpO2: 100%    Physical Exam  Constitutional: He is oriented to person, place, and time. He appears well-developed and well-nourished.  Cardiovascular: Normal rate and regular rhythm.   Respiratory: Effort normal and breath sounds normal.  GI: Soft. Bowel sounds are normal. There is no tenderness.  Musculoskeletal: Normal range of motion. He exhibits no edema.  Neurological: He is alert and oriented to person, place, and time.     Assessment/Plan Patient with history of multiple myeloma presents today for restaging CT guided bone marrow biopsy. Details/risks of procedure d/w pt/family with  their understanding and consent.   ALLRED,D KEVIN 10/07/2013, 9:47 AM

## 2013-10-12 ENCOUNTER — Telehealth: Payer: Self-pay | Admitting: *Deleted

## 2013-10-12 ENCOUNTER — Encounter: Payer: Self-pay | Admitting: Oncology

## 2013-10-12 NOTE — Telephone Encounter (Addendum)
Message from pt requesting bone marrow bx results. Request to MD. Dr. Benay Spice discussed results with pt on 10/12/13.

## 2013-10-13 ENCOUNTER — Telehealth: Payer: Self-pay | Admitting: *Deleted

## 2013-10-13 NOTE — Telephone Encounter (Signed)
Spoke with Maudie Mercury in pathology, requested to add myeloma and myelodysplasia FISH panel to bone marrow cytogenetics.

## 2013-10-14 ENCOUNTER — Telehealth: Payer: Self-pay | Admitting: *Deleted

## 2013-10-14 NOTE — Telephone Encounter (Signed)
Message from [place] in [place] lab. Returned call, spoke with Eulas Post to confirm Dr. Benay Spice is requesting multiple myeloma and MDS FISH testing on bone marrow.

## 2013-10-15 ENCOUNTER — Telehealth: Payer: Self-pay | Admitting: *Deleted

## 2013-10-15 NOTE — Telephone Encounter (Signed)
Left message that he has not heard from Dr. Ok Edwards or Melba Coon at [facility] yet. Dr. Benay Spice made aware.

## 2013-10-18 ENCOUNTER — Telehealth: Payer: Self-pay | Admitting: Oncology

## 2013-10-18 LAB — TISSUE HYBRIDIZATION (BONE MARROW)-NCBH

## 2013-10-18 LAB — CHROMOSOME ANALYSIS, BONE MARROW

## 2013-10-18 NOTE — Telephone Encounter (Signed)
LABS/BM REPORT/CYTOGENETIC REP FAXED TO DR. [facility] OFFICE @ [phone]

## 2013-10-19 ENCOUNTER — Telehealth: Payer: Self-pay | Admitting: *Deleted

## 2013-10-19 NOTE — Telephone Encounter (Signed)
Faxed last bone marrow report with cytogenetics to Dr. Renetta Chalk at [phone]. Confirmed with Alvy that he has not heard from [facility] yet regarding appointment. Informed him I will follow up tomorrow.

## 2013-10-20 ENCOUNTER — Telehealth: Payer: Self-pay | Admitting: *Deleted

## 2013-10-20 NOTE — Telephone Encounter (Signed)
Confirmed with [place] that he got message for [facility] appointment on Friday.

## 2013-10-20 NOTE — Telephone Encounter (Signed)
Called [facility] and obtained appointment for [place] to see Dr. Ok Edwards on 10/22/13 at 10:15. Check-in is on ground floor now.  Detailed message left for patient on home/cell #.

## 2013-10-21 ENCOUNTER — Encounter (HOSPITAL_COMMUNITY): Payer: Self-pay

## 2013-11-03 ENCOUNTER — Other Ambulatory Visit: Payer: Self-pay | Admitting: *Deleted

## 2013-11-03 ENCOUNTER — Emergency Department (HOSPITAL_COMMUNITY): Payer: BC Managed Care – PPO

## 2013-11-03 ENCOUNTER — Inpatient Hospital Stay (HOSPITAL_COMMUNITY)
Admission: EM | Admit: 2013-11-03 | Discharge: 2013-11-07 | DRG: 194 | Disposition: A | Discharge status: Home / Self Care | Payer: BC Managed Care – PPO | Attending: Internal Medicine | Admitting: Internal Medicine

## 2013-11-03 ENCOUNTER — Ambulatory Visit (HOSPITAL_BASED_OUTPATIENT_CLINIC_OR_DEPARTMENT_OTHER): Payer: BC Managed Care – PPO

## 2013-11-03 ENCOUNTER — Telehealth: Payer: Self-pay | Admitting: *Deleted

## 2013-11-03 ENCOUNTER — Inpatient Hospital Stay (HOSPITAL_COMMUNITY): Payer: BC Managed Care – PPO

## 2013-11-03 ENCOUNTER — Encounter (HOSPITAL_COMMUNITY): Payer: Self-pay | Admitting: Emergency Medicine

## 2013-11-03 ENCOUNTER — Encounter: Payer: Self-pay | Admitting: Oncology

## 2013-11-03 DIAGNOSIS — E876 Hypokalemia: Secondary | ICD-10-CM | POA: Diagnosis present

## 2013-11-03 DIAGNOSIS — Z79899 Other long term (current) drug therapy: Secondary | ICD-10-CM

## 2013-11-03 DIAGNOSIS — Z860100 Personal history of colon polyps, unspecified: Secondary | ICD-10-CM

## 2013-11-03 DIAGNOSIS — D696 Thrombocytopenia, unspecified: Secondary | ICD-10-CM | POA: Diagnosis present

## 2013-11-03 DIAGNOSIS — J189 Pneumonia, unspecified organism: Secondary | ICD-10-CM | POA: Diagnosis present

## 2013-11-03 DIAGNOSIS — Z807 Family history of other malignant neoplasms of lymphoid, hematopoietic and related tissues: Secondary | ICD-10-CM

## 2013-11-03 DIAGNOSIS — Z9484 Stem cells transplant status: Secondary | ICD-10-CM

## 2013-11-03 DIAGNOSIS — C9000 Multiple myeloma not having achieved remission: Secondary | ICD-10-CM

## 2013-11-03 DIAGNOSIS — Z9221 Personal history of antineoplastic chemotherapy: Secondary | ICD-10-CM

## 2013-11-03 DIAGNOSIS — D649 Anemia, unspecified: Secondary | ICD-10-CM | POA: Diagnosis present

## 2013-11-03 DIAGNOSIS — Z888 Allergy status to other drugs, medicaments and biological substances status: Secondary | ICD-10-CM

## 2013-11-03 DIAGNOSIS — E7800 Pure hypercholesterolemia, unspecified: Secondary | ICD-10-CM | POA: Diagnosis present

## 2013-11-03 DIAGNOSIS — Z7982 Long term (current) use of aspirin: Secondary | ICD-10-CM

## 2013-11-03 DIAGNOSIS — Z8249 Family history of ischemic heart disease and other diseases of the circulatory system: Secondary | ICD-10-CM

## 2013-11-03 DIAGNOSIS — Z8701 Personal history of pneumonia (recurrent): Secondary | ICD-10-CM

## 2013-11-03 DIAGNOSIS — I10 Essential (primary) hypertension: Secondary | ICD-10-CM | POA: Diagnosis present

## 2013-11-03 DIAGNOSIS — I2510 Atherosclerotic heart disease of native coronary artery without angina pectoris: Secondary | ICD-10-CM | POA: Diagnosis present

## 2013-11-03 DIAGNOSIS — IMO0001 Reserved for inherently not codable concepts without codable children: Secondary | ICD-10-CM

## 2013-11-03 DIAGNOSIS — E785 Hyperlipidemia, unspecified: Secondary | ICD-10-CM | POA: Diagnosis present

## 2013-11-03 DIAGNOSIS — Z881 Allergy status to other antibiotic agents status: Secondary | ICD-10-CM

## 2013-11-03 DIAGNOSIS — Z880 Allergy status to penicillin: Secondary | ICD-10-CM

## 2013-11-03 DIAGNOSIS — D469 Myelodysplastic syndrome, unspecified: Secondary | ICD-10-CM | POA: Diagnosis present

## 2013-11-03 DIAGNOSIS — R509 Fever, unspecified: Secondary | ICD-10-CM | POA: Diagnosis present

## 2013-11-03 LAB — COMPREHENSIVE METABOLIC PANEL
ALBUMIN: 4 g/dL (ref 3.5–5.2)
ALT: 17 U/L (ref 0–53)
AST: 20 U/L (ref 0–37)
Alkaline Phosphatase: 56 U/L (ref 39–117)
BILIRUBIN TOTAL: 1.5 mg/dL — AB (ref 0.3–1.2)
BUN: 13 mg/dL (ref 6–23)
CHLORIDE: 99 meq/L (ref 96–112)
CO2: 22 meq/L (ref 19–32)
CREATININE: 0.99 mg/dL (ref 0.50–1.35)
Calcium: 10 mg/dL (ref 8.4–10.5)
GFR, EST NON AFRICAN AMERICAN: 88 mL/min — AB (ref >90)
Glucose, Bld: 96 mg/dL (ref 70–99)
Potassium: 3.7 mEq/L (ref 3.7–5.3)
Sodium: 137 mEq/L (ref 137–147)
Total Protein: 8 g/dL (ref 6.0–8.3)

## 2013-11-03 LAB — CBC WITH DIFFERENTIAL/PLATELET
BASO%: 0.1 % (ref 0.0–2.0)
Basophils Absolute: 0 10*3/uL (ref 0.0–0.1)
Basophils Absolute: 0 10*3/uL (ref 0.0–0.1)
Basophils Relative: 0 % (ref 0–1)
EOS ABS: 0 10*3/uL (ref 0.0–0.7)
EOS%: 0.1 % (ref 0.0–7.0)
Eosinophils Absolute: 0 10*3/uL (ref 0.0–0.5)
Eosinophils Relative: 0 % (ref 0–5)
HCT: 28 % — ABNORMAL LOW (ref 38.4–49.9)
HCT: 26.3 % — ABNORMAL LOW (ref 39.0–52.0)
HGB: 9.4 g/dL — ABNORMAL LOW (ref 13.0–17.1)
Hemoglobin: 8.9 g/dL — ABNORMAL LOW (ref 13.0–17.0)
LYMPH%: 8.8 % — AB (ref 14.0–49.0)
LYMPHS ABS: 0.8 10*3/uL (ref 0.7–4.0)
LYMPHS PCT: 10 % — AB (ref 12–46)
MCH: 32 pg (ref 27.2–33.4)
MCH: 32.5 pg (ref 26.0–34.0)
MCHC: 33.6 g/dL (ref 32.0–36.0)
MCHC: 33.8 g/dL (ref 30.0–36.0)
MCV: 95.2 fL (ref 79.3–98.0)
MCV: 96 fL (ref 78.0–100.0)
MONO#: 0.8 10*3/uL (ref 0.1–0.9)
MONO%: 11.9 % (ref 0.0–14.0)
Monocytes Absolute: 1.3 10*3/uL — ABNORMAL HIGH (ref 0.1–1.0)
Monocytes Relative: 17 % — ABNORMAL HIGH (ref 3–12)
NEUT#: 5.3 10*3/uL (ref 1.5–6.5)
NEUT%: 79.1 % — ABNORMAL HIGH (ref 39.0–75.0)
NRBC: 3 % — AB (ref 0–0)
NRBC: 2 /100{WBCs} — AB
Neutro Abs: 5.5 10*3/uL (ref 1.7–7.7)
Neutrophils Relative %: 73 % (ref 43–77)
PLATELETS: 57 10*3/uL — AB (ref 140–400)
Platelets: 66 10*3/uL — ABNORMAL LOW (ref 150–400)
RBC: 2.94 10*6/uL — AB (ref 4.20–5.82)
RBC: 2.74 MIL/uL — ABNORMAL LOW (ref 4.22–5.81)
RDW: 17.4 % — ABNORMAL HIGH (ref 11.0–14.6)
RDW: 17.2 % — AB (ref 11.5–15.5)
WBC: 6.7 10*3/uL (ref 4.0–10.3)
WBC: 7.6 10*3/uL (ref 4.0–10.5)
lymph#: 0.6 10*3/uL — ABNORMAL LOW (ref 0.9–3.3)

## 2013-11-03 LAB — COMPREHENSIVE METABOLIC PANEL (CC13)
ALT: 14 U/L (ref 0–55)
ANION GAP: 9 meq/L (ref 3–11)
AST: 19 U/L (ref 5–34)
Albumin: 4.2 g/dL (ref 3.5–5.0)
Alkaline Phosphatase: 60 U/L (ref 40–150)
BUN: 12 mg/dL (ref 7.0–26.0)
CO2: 22 mEq/L (ref 22–29)
CREATININE: 1.1 mg/dL (ref 0.7–1.3)
Calcium: 9.9 mg/dL (ref 8.4–10.4)
Chloride: 106 mEq/L (ref 98–109)
Glucose: 120 mg/dl (ref 70–140)
Potassium: 3.7 mEq/L (ref 3.5–5.1)
Sodium: 137 mEq/L (ref 136–145)
Total Bilirubin: 1.75 mg/dL — ABNORMAL HIGH (ref 0.20–1.20)
Total Protein: 8.2 g/dL (ref 6.4–8.3)

## 2013-11-03 LAB — URINALYSIS, ROUTINE W REFLEX MICROSCOPIC
BILIRUBIN URINE: NEGATIVE
Glucose, UA: NEGATIVE mg/dL
KETONES UR: NEGATIVE mg/dL
Leukocytes, UA: NEGATIVE
NITRITE: NEGATIVE
PROTEIN: 30 mg/dL — AB
Specific Gravity, Urine: 1.013 (ref 1.005–1.030)
Urobilinogen, UA: 1 mg/dL (ref 0.0–1.0)
pH: 6.5 (ref 5.0–8.0)

## 2013-11-03 LAB — URINE MICROSCOPIC-ADD ON

## 2013-11-03 LAB — I-STAT CG4 LACTIC ACID, ED: LACTIC ACID, VENOUS: 1.42 mmol/L (ref 0.5–2.2)

## 2013-11-03 MED ORDER — SULFAMETHOXAZOLE-TRIMETHOPRIM 800-160 MG PO TABS: 1.0000 | ORAL_TABLET | Freq: Two times a day (BID) | ORAL | Status: DC

## 2013-11-03 MED ORDER — VANCOMYCIN HCL 10 G IV SOLR
1500.0000 mg | Freq: Two times a day (BID) | INTRAVENOUS | Status: DC
  Administered 2013-11-03 – 2013-11-04 (×3): 1500 mg via INTRAVENOUS
  Filled 2013-11-03 (×4): qty 1500

## 2013-11-03 MED ORDER — IBUPROFEN 200 MG PO TABS
400.0000 mg | ORAL_TABLET | Freq: Once | ORAL | Status: AC
  Administered 2013-11-03: 400 mg via ORAL
  Filled 2013-11-03: qty 2

## 2013-11-03 MED ORDER — SODIUM CHLORIDE 0.9 % IV SOLN
1000.0000 mL | INTRAVENOUS | Status: DC
  Administered 2013-11-03: 1000 mL via INTRAVENOUS

## 2013-11-03 MED ORDER — IOHEXOL 300 MG/ML  SOLN
80.0000 mL | Freq: Once | INTRAMUSCULAR | Status: AC | PRN
  Administered 2013-11-03: 80 mL via INTRAVENOUS

## 2013-11-03 MED ORDER — SODIUM CHLORIDE 0.9 % IV SOLN
1000.0000 mL | Freq: Once | INTRAVENOUS | Status: AC
  Administered 2013-11-03: 1000 mL via INTRAVENOUS

## 2013-11-03 MED ORDER — AZTREONAM 2 G IJ SOLR
2.0000 g | Freq: Three times a day (TID) | INTRAMUSCULAR | Status: DC
  Administered 2013-11-04 – 2013-11-07 (×10): 2 g via INTRAVENOUS
  Filled 2013-11-03 (×12): qty 2

## 2013-11-03 NOTE — ED Provider Notes (Signed)
CSN: [id number]     Arrival date & time 11/03/13  1710 History   First MD Initiated Contact with Patient 11/03/13 1727     Chief Complaint  Patient presents with  . Fever     (Consider location/radiation/quality/duration/timing/severity/associated sxs/prior Treatment) The history is provided by the patient, medical records and the spouse. No language interpreter was used.   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  Patient states that he began running a fever of 101 last night.  He went in to see his oncologist today and he prescribed Bactrim this morning.   Past Medical History  Diagnosis Date  . Unspecified essential hypertension   . Coronary atherosclerosis of unspecified type of vessel, native or graft   . Pure hypercholesterolemia   . Overweight   . Benign neoplasm of colon   . Multiple myeloma, without mention of having achieved remission 2004  . Urticaria, unspecified   . 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 Cancer 10/26/11 ct    multiple myelomatous lesion proximal right humerus,scaspula and ribs,  . History of chemotherapy 05/2003    high dose chemotherapy with autologus stem cell support/revlimid started 09/27/11  . Pneumonia 12/2007    hx  12/2007 & agian 01/06/09  . Allergy   . URI (upper respiratory infection) 08/2009    viral URI  . Neuropathy     hx intermittement tingling/numbness in feet r/t? revlimid, , resolved  . Neuropathy 10/22/11    intermittent tingling feet  again with revlimid therapy  . H/O cardiac catheterization 07/13/02  . H/O colonoscopy 04/2007    61m desc colon polyp =adenomatous  . [facility]  . Anemia   . Heart murmur    Past Surgical History   Procedure Laterality Date  . 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 Video bronchoscopy Bilateral 09/21/2012    Procedure: VIDEO BRONCHOSCOPY WITHOUT FLUORO;  Surgeon: DWilhelmina Mcardle MD;  Location: [facility];  Service: Cardiopulmonary;  Laterality: Bilateral;   Family History  Problem Relation Age of Onset  . Cancer Father 847   lymphoma   History  Substance Use Topics  . Smoking status: Never Smoker   . Smokeless tobacco: Never Used  . Alcohol Use: Yes     Comment: social etoh    Review of Systems  Constitutional: Positive for fever and chills.  Musculoskeletal: Positive for myalgias.  All other systems reviewed and are negative.     Allergies  Primaxin; Cephalosporins; Lisinopril; Moxifloxacin; Penicillins; and Valsartan  Home Medications   Prior to Admission medications   Medication Sig Start Date End Date Taking? Authorizing Provider  aspirin 81 MG tablet Take 81 mg by mouth daily.   Yes Historical Provider, MD  Calcium 600-200 MG-UNIT per tablet Take 1 tablet by mouth 2 (two) times daily. 06/22/12  Yes GLadell Pier MD  sulfamethoxazole-trimethoprim (BACTRIM DS,SEPTRA DS) 800-160 MG per tablet Take 1 tablet by mouth 2 (two) times daily. 11/03/13  Yes GLadell Pier MD  lenalidomide (REVLIMID) 10 MG capsule Take 1 capsule (10 mg total) by mouth daily. 08/26/13   GLadell Pier MD  Zoledronic Acid (ZOMETA IV) Inject 4 mg  into the vein every 3 (three) months.     Historical Provider, MD   BP 173/84  Pulse 100  Temp(Src) 101 F (38.3 C) (Oral)  Resp 20  SpO2 100% Physical Exam  Nursing note and vitals reviewed. Constitutional: He appears well-developed and well-nourished. No distress.  HENT:  Head: Normocephalic and atraumatic.  Eyes: Conjunctivae are normal. No scleral icterus.  Neck: Normal range of motion. Neck supple.  Cardiovascular: Normal rate, regular rhythm and normal heart sounds.   Pulmonary/Chest:  Effort normal and breath sounds normal. No respiratory distress.  Abdominal: Soft. There is no tenderness.  Musculoskeletal: He exhibits no edema.  Neurological: He is alert.  Skin: Skin is warm and dry. He is not diaphoretic.  Psychiatric: His behavior is normal.    ED Course  Procedures (including critical care time) Labs Review Labs Reviewed - No data to display  Imaging Review No results found.   EKG Interpretation None      MDM   Final diagnoses:  None   Patient with hx MM and new dx of myelodysplastic disorder here with fever. I have initiated workup. Patient febrile but otherwise well appearing.  Patient took Tylenol just prior to arrival.   Patient with continued fever up to 103.1.  I've given patient 400 mg of ibuprofen.  Patient workup appears negative at this time.  Negative chest x-ray.  Blood chemistries are within normal limits.  Anemic but appears at baseline with stable CBC. Urine is negative for overt infection. Spoken with Dr. Juliann Mule who is on call for oncology. He states  That if the patients Fever trends down he may go home with close follow up , otherwise he may be admitted to observation and cleared if his blood/urine cultures are negative.    Patient fever only down to 102.9 1 hour after admin of advil. I have spoken to Dr. Dyann Kief who willl admit the patient for obs. Patient and wife are in agreement with plan. Patient hemodynamically stable throughout visit. No hypotension or tachycardia.  The patient appears reasonably stabilized for admission considering the current resources, flow, and capabilities available in the ED at this time, and I doubt any other [facility] requiring further screening and/or treatment in the ED prior to admission.          Margarita Mail, PA-C 11/05/13 0740

## 2013-11-03 NOTE — ED Notes (Signed)
Pt c/o fever since last night.  Pt was seen at [facility] earlier today and started on Bactrim.  Pt reports that fever had increased to 102.5 when he got home.  Pt called [name] MD and was directed to come to ED.  Pt reports that he took 2 Tylenol around 1400.  Pt denies SOB or GU complaints.

## 2013-11-03 NOTE — Telephone Encounter (Signed)
Collaborative nurse notified of this message.

## 2013-11-03 NOTE — ED Notes (Signed)
Pt returned from CT at this time.  

## 2013-11-03 NOTE — Telephone Encounter (Signed)
Message from pt reporting temp 101.1 last night for which he took Tylenol. Temp this AM 100.5. Denies any respiratory, GI or GU symptoms. Reviewed with Dr. Benay Spice: Bring pt in for CBC.  Returned call to pt, latest temp 100. Instructed him to come in for lab at 1215. He voiced understanding. Order received from Dr. Benay Spice for Bactrim DS 1 tab BID for 7 days.

## 2013-11-03 NOTE — Telephone Encounter (Signed)
Called pt with CBC results. WBC normal, PLT remain low. Begin Bactrim BID, per Dr. Benay Spice. Go to ED for shaking chills or shortness of breath pt voiced understanding.

## 2013-11-03 NOTE — Telephone Encounter (Signed)
Reviewed pt's Mychart message with Dr. Benay Spice: Send pt to ED, immunocompromised. Pt voiced understanding, will go to [facility] ED to be evaluated. Pt reports he has had one dose of Bactrim today.

## 2013-11-03 NOTE — Progress Notes (Signed)
ANTIBIOTIC CONSULT NOTE - INITIAL  Pharmacy Consult for Azactam/Vancomycin Indication: FUO  Allergies  Allergen Reactions  . Primaxin [Imipenem] Rash  . Cephalosporins     Reaction unknown  . Lisinopril     REACTION: ALLERGIC to ACE w/ cough...  . Moxifloxacin     Swollen lips  . Penicillins     rash  . Valsartan     REACTION: ALLERGIC to ARB w/ Urticaria \T\ Angioedema    Patient Measurements:   Wt=93 kg  Vital Signs: Temp: 102.9 F (39.4 C) (04/22 2133) Temp src: Oral (04/22 2133) BP: 145/71 mmHg (04/22 2230) Pulse Rate: 94 (04/22 2230) Intake/Output from previous day:   Intake/Output from this shift:    Labs:  Recent Labs  11/03/13 1230 11/03/13 1230 11/03/13 1805  WBC 6.7  --  7.6  HGB 9.4*  --  8.9*  PLT 57*  --  66*  CREATININE  --  1.1 0.99   The CrCl is unknown because both a height and weight (above a minimum accepted value) are required for this calculation. No results found for this basename: VANCOTROUGH, VANCOPEAK, VANCORANDOM, GENTTROUGH, GENTPEAK, GENTRANDOM, TOBRATROUGH, TOBRAPEAK, TOBRARND, AMIKACINPEAK, AMIKACINTROU, AMIKACIN,  in the last 72 hours   Microbiology: No results found for this or any previous visit (from the past 720 hour(s)).  Medical History: Past Medical History  Diagnosis Date  . Unspecified essential hypertension   . Coronary atherosclerosis of unspecified type of vessel, native or graft   . Pure hypercholesterolemia   . Overweight   . Benign neoplasm of colon   . Multiple myeloma, without mention of having achieved remission 2004  . Urticaria, unspecified   . 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 Cancer 10/26/11 ct    multiple myelomatous lesion proximal right humerus,scaspula and ribs,  . History of chemotherapy 05/2003    high dose chemotherapy with autologus stem cell support/revlimid started 09/27/11  . Pneumonia 12/2007    hx  12/2007 & agian 01/06/09  . Allergy   . URI (upper respiratory infection)  08/2009    viral URI  . Neuropathy     hx intermittement tingling/numbness in feet r/t? revlimid, , resolved  . Neuropathy 10/22/11    intermittent tingling feet  again with revlimid therapy  . H/O cardiac catheterization 07/13/02  . H/O colonoscopy 04/2007    37m desc colon polyp =adenomatous  . [facility]  . Anemia   . Heart murmur     Medications:  Scheduled:   Infusions:  . sodium chloride 1,000 mL (11/03/13 1915)  . aztreonam    . 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 Patient states that he began running a fever of 101 last night. He went in to see his oncologist 4/22 and he was prescribed Bactrim.   Goal of Therapy:  Vancomycin trough level 15-20 mcg/ml  Plan:   Azactam 2Gm IV q8h  Vancomycin 15076mIV q12h  F/U SCr/levels/cultures as needed  ElDorrene German/22/2015,11:14 PM

## 2013-11-03 NOTE — H&P (Signed)
[facility] History and Physical  Aquil Duhe TKZ:[id number] DOB: September 10, 1955 DOA: 11/03/2013  Referring physician: Margarita Mail, PA-C PCP: Gwendolyn Grant, MD   Chief Complaint: fever  HPI: Chad [name] is a 58 y.o. male with past medical history significant for hypertension, history of hyperlipidemia, coronary artery disease, multiple myeloma (status post 2 stem cell transplants), myelodysplastic disease, thrombocytopenia and seasonal allergic rhinitis; presented to Ed complaining of high grade fever. Patient reports fever started approx 36 hours prior to admission and that he had bloodwork done at [facility] today with subsequent indication to start using bactrim for 7 days. Since fever continue (and in fact got higher), he was advise to come to ED for further evaluation and treatment. Portable CXR w/o acute infiltrates; but patient endorses intermittent non-productive cough. WBC's WNL, chronic low platelets and Hgb. [facility] called to admit patient for further evaluation and treatment of FUO.   Review of Systems:  Negative except as otherwise mentioned on history of present illness.   Past Medical History  Diagnosis Date  . Unspecified essential hypertension   . Coronary atherosclerosis of unspecified type of vessel, native or graft   . Pure hypercholesterolemia   . Overweight   . Benign neoplasm of colon   . Multiple myeloma, without mention of having achieved remission 2004  . Urticaria, unspecified   . 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 Cancer 10/26/11 ct    multiple myelomatous lesion proximal right humerus,scaspula and ribs,  . History of chemotherapy 05/2003    high dose chemotherapy with autologus stem cell support/revlimid started 09/27/11  . Pneumonia 12/2007    hx  12/2007 & agian 01/06/09  . Allergy   . URI (upper respiratory infection) 08/2009    viral URI  . Neuropathy     hx intermittement tingling/numbness in feet r/t? revlimid, , resolved  . Neuropathy  10/22/11    intermittent tingling feet  again with revlimid therapy  . H/O cardiac catheterization 07/13/02  . H/O colonoscopy 04/2007    8m desc colon polyp =adenomatous  . [facility]  . Anemia   . Heart murmur    Past Surgical History  Procedure Laterality Date  . 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 Video bronchoscopy Bilateral 09/21/2012    Procedure: VIDEO BRONCHOSCOPY WITHOUT FLUORO;  Surgeon: DWilhelmina Mcardle MD;  Location: [facility];  Service: Cardiopulmonary;  Laterality: Bilateral;   Social History:  reports that he has never smoked. He has never used smokeless tobacco. He reports that he drinks alcohol. He reports that he does not use illicit drugs.  Allergies  Allergen Reactions  . Primaxin [Imipenem] Rash  . Cephalosporins     Reaction unknown  . Lisinopril     REACTION: ALLERGIC to ACE w/ cough...  . Moxifloxacin     Swollen lips  . Penicillins     rash  . Valsartan     REACTION: ALLERGIC to ARB w/ Urticaria \T\ Angioedema    Family History  Problem Relation Age of Onset  . Cancer Father 827   lymphoma     Prior to Admission medications   Medication Sig Start Date End Date Taking? Authorizing Provider  aspirin 81 MG tablet Take 81 mg by mouth daily.   Yes Historical Provider, MD  Calcium 600-200 MG-UNIT per tablet Take 1 tablet by mouth 2 (two) times daily. 06/22/12  Yes Ladell Pier, MD  sulfamethoxazole-trimethoprim (BACTRIM DS,SEPTRA DS) 800-160 MG per tablet Take 1 tablet by mouth 2 (two) times daily. 11/03/13  Yes Ladell Pier, MD  lenalidomide (REVLIMID) 10 MG capsule Take 1 capsule (10 mg total) by mouth daily. 08/26/13   Ladell Pier, MD  Zoledronic Acid (ZOMETA IV) Inject 4 mg into the vein every 3 (three) months.     Historical Provider, MD   Physical Exam: Filed Vitals:   11/03/13 2133  BP: 150/72  Pulse: 93  Temp: 102.9 F (39.4 C)  Resp: 16     BP 150/72  Pulse 93  Temp(Src) 102.9 F (39.4 C) (Oral)  Resp 16  SpO2 98%  General:  Appears calm and comfortable; positive fever Eyes: PERRL, normal lids, irises & conjunctiva; nonicteric dose, no nystagmus ENT: grossly normal hearing, lips & tongue; no erythema or exudate inside his mouth; no drainage out of his ears or nostrils.  Neck: no LAD, masses or thyromegaly, no JVD  Cardiovascular:  with mild tachycardia  (most likely secondary to ongoing fever), no rubs or gallops. No LE edema. Respiratory:  scattered rhonchi, otherwise CTA bilaterally. Normal respiratory effort. Abdomen: soft, nt, nd; positive bowel sounds  Skin: no rash or induration seen on exam Musculoskeletal: grossly normal tone BUE/BLE Psychiatric: grossly normal mood and affect, speech fluent and appropriate Neurologic: grossly non-focal.          Labs on Admission:  Basic Metabolic Panel:  Recent Labs Lab 11/03/13 1230 11/03/13 1805  NA 137 137  K 3.7 3.7  CL  --  99  CO2 22 22  GLUCOSE 120 96  BUN 12.0 13  CREATININE 1.1 0.99  CALCIUM 9.9 10.0   Liver Function Tests:  Recent Labs Lab 11/03/13 1230 11/03/13 1805  AST 19 20  ALT 14 17  ALKPHOS 60 56  BILITOT 1.75* 1.5*  PROT 8.2 8.0  ALBUMIN 4.2 4.0   CBC:  Recent Labs Lab 11/03/13 1230 11/03/13 1805  WBC 6.7 7.6  NEUTROABS 5.3 5.5  HGB 9.4* 8.9*  HCT 28.0* 26.3*  MCV 95.2 96.0  PLT 57* 66*    Radiological Exams on Admission: Dg Chest Port 1 View  11/03/2013   CLINICAL DATA:  Fever  EXAM: PORTABLE CHEST - 1 VIEW  COMPARISON:  DG CHEST 2 VIEW dated 09/11/2012; CT BIOPSY dated 10/07/2013  FINDINGS: Normal heart size. Clear lungs. No pneumothorax or pleural effusion.  IMPRESSION: No active cardiopulmonary disease.   Electronically Signed   By: Maryclare Bean M.D.   On: 11/03/2013 18:51    EKG:  None  Assessment/Plan 1-FUO (fever of unknown origin): Immunocompromised patient with history of multiple myeloma/myelodysplastic  disease  Who presented to the ED with history of high-grade fever for the last 48 hours prior to admission. Patient endorses some non-productive intermittent cough; otherwise no other symptoms or complaints that could guide as etiology/source for his fever. -Patient will be admitted to [facility] bed -Blood cultures, urine culture, influenza/viral panel and CT scan with contrast of his chest has been order -Patient empirically started on vancomycin and aztreonam (given antibiotic allergies) -started on PRN antipyretics  -Will provide IVF's resuscitation and supportive care -If fever continues on CT chest is unremarkable; next step on his workup will be CT abdomen and pelvis and ID consultation.  2-MULTIPLE  MYELOMA/MYELODYSPLASTIC DISEASE: Per oncology service -hemoglobin stable and currently in no need of transfusion.  3-HYPERCHOLESTEROLEMIA: At home patient was not taking any statins products -will check lipid  panel  4-HYPERTENSION and hx of CORONARY ARTERY DISEASE: Not taking any antihypertensive regimen. -Will start a low-sodium/heart healthy diet -Continue aspirin -Patient not complaining of any chest pain or shortness of breath.  5-Thrombocytopenia: appears to be chronic and stable. No signs of overt bleeding  -Will Avoid heparin products    DVT and GI prophylaxis: SCDs and Protonix  Oncology service consulted by ED (They are planning to see patient in am; Dr. Ammie Dalton added as consultant; but formally call in am if patient not seen)  Code Status: Full Family Communication: wife at bedside Disposition Plan: inpatient, LOS > 2 midnights; med-surg bed  Time spent: 50 minutes  [facility] Pager [phone]

## 2013-11-03 NOTE — ED Notes (Signed)
Pt to CT at this time.

## 2013-11-04 ENCOUNTER — Encounter (HOSPITAL_COMMUNITY): Payer: Self-pay | Admitting: *Deleted

## 2013-11-04 DIAGNOSIS — J189 Pneumonia, unspecified organism: Secondary | ICD-10-CM | POA: Diagnosis present

## 2013-11-04 DIAGNOSIS — E876 Hypokalemia: Secondary | ICD-10-CM | POA: Diagnosis present

## 2013-11-04 DIAGNOSIS — D469 Myelodysplastic syndrome, unspecified: Secondary | ICD-10-CM

## 2013-11-04 DIAGNOSIS — D649 Anemia, unspecified: Secondary | ICD-10-CM

## 2013-11-04 DIAGNOSIS — R509 Fever, unspecified: Secondary | ICD-10-CM | POA: Diagnosis present

## 2013-11-04 LAB — BASIC METABOLIC PANEL
BUN: 10 mg/dL (ref 6–23)
CHLORIDE: 102 meq/L (ref 96–112)
CO2: 19 meq/L (ref 19–32)
CREATININE: 1 mg/dL (ref 0.50–1.35)
Calcium: 8.8 mg/dL (ref 8.4–10.5)
GFR calc non Af Amer: 80 mL/min — ABNORMAL LOW (ref >90)
GLUCOSE: 101 mg/dL — AB (ref 70–99)
POTASSIUM: 3.3 meq/L — AB (ref 3.7–5.3)
Sodium: 137 mEq/L (ref 137–147)

## 2013-11-04 LAB — LIPID PANEL
Cholesterol: 124 mg/dL (ref 0–200)
HDL: 49 mg/dL (ref >39)
LDL Cholesterol: 58 mg/dL (ref 0–99)
Total CHOL/HDL Ratio: 2.5 RATIO
Triglycerides: 84 mg/dL (ref <150)
VLDL: 17 mg/dL (ref 0–40)

## 2013-11-04 LAB — URINE CULTURE
Colony Count: NO GROWTH
Culture: NO GROWTH

## 2013-11-04 LAB — CBC
HEMATOCRIT: 23.5 % — AB (ref 39.0–52.0)
HEMOGLOBIN: 7.9 g/dL — AB (ref 13.0–17.0)
MCH: 32 pg (ref 26.0–34.0)
MCHC: 33.6 g/dL (ref 30.0–36.0)
MCV: 95.1 fL (ref 78.0–100.0)
Platelets: 44 10*3/uL — ABNORMAL LOW (ref 150–400)
RBC: 2.47 MIL/uL — ABNORMAL LOW (ref 4.22–5.81)
RDW: 17.2 % — AB (ref 11.5–15.5)
WBC: 4.6 10*3/uL (ref 4.0–10.5)

## 2013-11-04 LAB — MAGNESIUM: Magnesium: 2.1 mg/dL (ref 1.5–2.5)

## 2013-11-04 LAB — INFLUENZA PANEL BY PCR (TYPE A & B)
H1N1 flu by pcr: NOT DETECTED
Influenza A By PCR: NEGATIVE
Influenza B By PCR: NEGATIVE

## 2013-11-04 LAB — PHOSPHORUS: Phosphorus: 3.8 mg/dL (ref 2.3–4.6)

## 2013-11-04 MED ORDER — CALCIUM CARBONATE-VITAMIN D 500-200 MG-UNIT PO TABS
1.0000 | ORAL_TABLET | Freq: Two times a day (BID) | ORAL | Status: DC
  Administered 2013-11-04 – 2013-11-07 (×7): 1 via ORAL
  Filled 2013-11-04 (×9): qty 1

## 2013-11-04 MED ORDER — KETOROLAC TROMETHAMINE 30 MG/ML IJ SOLN
30.0000 mg | Freq: Once | INTRAMUSCULAR | Status: AC
  Administered 2013-11-04: 30 mg via INTRAVENOUS
  Filled 2013-11-04: qty 1

## 2013-11-04 MED ORDER — PANTOPRAZOLE SODIUM 40 MG PO TBEC
40.0000 mg | DELAYED_RELEASE_TABLET | Freq: Every day | ORAL | Status: DC
  Administered 2013-11-04 – 2013-11-07 (×4): 40 mg via ORAL
  Filled 2013-11-04 (×4): qty 1

## 2013-11-04 MED ORDER — ONDANSETRON HCL 4 MG PO TABS: 4.0000 mg | ORAL_TABLET | Freq: Four times a day (QID) | ORAL | Status: DC | PRN

## 2013-11-04 MED ORDER — POTASSIUM CHLORIDE IN NACL 20-0.9 MEQ/L-% IV SOLN
INTRAVENOUS | Status: DC
  Administered 2013-11-04: 11:00:00 via INTRAVENOUS
  Administered 2013-11-05: 1000 mL via INTRAVENOUS
  Administered 2013-11-06 (×2): via INTRAVENOUS
  Filled 2013-11-04 (×7): qty 1000

## 2013-11-04 MED ORDER — ONDANSETRON HCL 4 MG/2ML IJ SOLN
4.0000 mg | Freq: Four times a day (QID) | INTRAMUSCULAR | Status: DC | PRN
  Administered 2013-11-04: 4 mg via INTRAVENOUS
  Filled 2013-11-04: qty 2

## 2013-11-04 MED ORDER — SODIUM CHLORIDE 0.9 % IV SOLN
INTRAVENOUS | Status: DC
  Administered 2013-11-04: 1000 mL via INTRAVENOUS

## 2013-11-04 MED ORDER — IBUPROFEN 800 MG PO TABS
400.0000 mg | ORAL_TABLET | Freq: Four times a day (QID) | ORAL | Status: DC | PRN
  Administered 2013-11-05: 400 mg via ORAL
  Filled 2013-11-04: qty 1

## 2013-11-04 MED ORDER — ACETAMINOPHEN 650 MG RE SUPP: 650.0000 mg | Freq: Four times a day (QID) | RECTAL | Status: DC | PRN

## 2013-11-04 MED ORDER — ACETAMINOPHEN 325 MG PO TABS
650.0000 mg | ORAL_TABLET | Freq: Four times a day (QID) | ORAL | Status: DC | PRN
  Administered 2013-11-04 – 2013-11-05 (×3): 650 mg via ORAL
  Filled 2013-11-04 (×4): qty 2

## 2013-11-04 MED ORDER — ASPIRIN 81 MG PO CHEW
81.0000 mg | CHEWABLE_TABLET | Freq: Every day | ORAL | Status: DC
  Filled 2013-11-04: qty 1

## 2013-11-04 NOTE — Progress Notes (Signed)
IP PROGRESS NOTE  Subjective:   Chad [name] is well-known to me with a history of multiple myeloma. He has a history of recurrent pneumonia. He is currently maintained off of specific therapy for multiple myeloma after a recent diagnosis of myelodysplasia.  He reports feeling well until 11/02/2013 when he developed a fever. The fever persisted on 11/03/2013 and he presented to the emergency room for further evaluation. A chest x-ray was unremarkable, but a CT of the chest revealed left lung pneumonia.  He reports otherwise feeling well. No significant cough.  Objective: Vital signs in last 24 hours: Blood pressure 128/64, pulse 87, temperature 99.8 F (37.7 C), temperature source Oral, resp. rate 20, height 5' 11.5" (1.816 m), weight 203 lb 6.4 oz (92.262 kg), SpO2 99.00%.  Intake/Output from previous day: 04/22 0701 - 04/23 0700 In: 1373.8 [P.O.:360; I.V.:463.8; IV Piggyback:550] Out: 900 [Urine:900]  Physical Exam:  HEENT: No thrush Lungs: Inspiratory rhonchi at the left lower posterior chest, no respiratory distress Cardiac: Regular rate and rhythm, tachycardia Abdomen: No hepatosplenomegaly Extremities: No leg edema     Lab Results:  Recent Labs  11/03/13 1805 11/04/13 0410  WBC 7.6 4.6  HGB 8.9* 7.9*  HCT 26.3* 23.5*  PLT 66* 44*    BMET  Recent Labs  11/03/13 1805 11/04/13 0410  NA 137 137  K 3.7 3.3*  CL 99 102  CO2 22 19  GLUCOSE 96 101*  BUN 13 10  CREATININE 0.99 1.00  CALCIUM 10.0 8.8    Studies/Results: Ct Chest W Contrast  11/04/2013   CLINICAL DATA:  Fever. Elevated white cell count. History of chemotherapy in 2004. Multiple myeloma.  EXAM: CT CHEST WITH CONTRAST  TECHNIQUE: Multidetector CT imaging of the chest was performed during intravenous contrast administration.  CONTRAST:  44m OMNIPAQUE IOHEXOL 300 MG/ML  SOLN  COMPARISON:  DG CHEST 1V PORT dated 11/03/2013; CT CHEST W/CM dated 09/20/2012  FINDINGS: Normal heart size. Coronary artery  calcifications. Normal caliber thoracic aorta. Scattered lymph nodes in the mediastinum are not pathologically enlarged. Esophagus is decompressed. Visualized portions of the upper abdominal organs are grossly unremarkable. There is scarring or dependent change in the lung bases. No evidence of any focal mass lesion in the lungs. There is focal consolidation demonstrated in the lingular segment of the left lung with air bronchograms. This is likely represent pneumonia. Bilateral infiltrates seen on the previous study have resolved in the interval. No pleural effusion or pneumothorax. Degenerative changes in the thoracic spine. Multiple lytic bone lesions demonstrated throughout the thoracic vertebrae, sternum, right scapula, and ribs compatible with known myeloma. Bone lesions appear similar to previous study.  IMPRESSION: Focal areas consolidation in the lingular segment of the left lung likely to represent focal pneumonia. Multiple lucent bone lesions consistent with known multiple myeloma.   Electronically Signed   By: WLucienne CapersM.D.   On: 11/04/2013 00:05   Dg Chest Port 1 View  11/03/2013   CLINICAL DATA:  Fever  EXAM: PORTABLE CHEST - 1 VIEW  COMPARISON:  DG CHEST 2 VIEW dated 09/11/2012; CT BIOPSY dated 10/07/2013  FINDINGS: Normal heart size. Clear lungs. No pneumothorax or pleural effusion.  IMPRESSION: No active cardiopulmonary disease.   Electronically Signed   By: AMaryclare BeanM.D.   On: 11/03/2013 18:51    Medications: I have reviewed the patient's current medications.  Assessment/Plan:  1. Multiple myeloma-status post high-dose chemotherapy with autologous stem cell support in November of 2004. The sero monoclonal protein was slowly  rising and a restaging bone marrow biopsy in November 2009 confirmed 10 to 20% plasma cells. The serum light chains were elevated 07/25/2008. He completed 12 cycles of salvage therapy with Revlimid/Decadron with the last cycle initiated 06/16/2009. On 07/13/2009  the serum free light chains were normal and a serum protein electrophoresis/immunofixation revealed no monoclonal protein. Elevated serum M spike and serum free kappa light chains March 2013. He began cycle 1 of Revlimid 25 mg daily x21 days followed by a 7 day break on 09/27/2011. He began a second cycle of salvage Revlimid beginning on 10/25/2011. The serum M spike was stable and the serum free kappa light chains were slightly more elevated on 11/19/2011. He began another cycle of Revlimid/Decadron on 11/22/2011. Velcade was added beginning on 11/29/2011. He completed 3 cycles of Revlimid/Velcade/Decadron with improvement in the serum M spike/serum free kappa light chains. The last cycle was initiated on 01/20/2012. He received melphalan 200 mg per meter squared on 03/17/2012 with autologous stem cell support on 03/18/2012. Serum immunofixation confirmed a monoclonal IgG kappa protein, too low to quantify and a mild elevation of the serum free kappa light chains on 05/19/2012. He began maintenance Revlimid on 07/11/2012. Serum M spike 0.25, serum free kappa light chains 3.04 on 08/07/2012. Revlimid was placed on hold during the 09/16/2012 hospital admission. Serum M spike 0.45 and serum free kappa light chains 1.83 on 10/21/2012. Revlimid was resumed 11/04/2012. The serum M spike was not detected and the kappa light chains were mildly elevated 05/03/2013 2. A serum immunofixation confirmed a monoclonal protein and the serum Kappa and Lambda light chains were mildly elevated in June of 2012  3. History of elevated liver enzymes. 4. Right lung pneumonia June, 2009 -- resolved after treatment with Bactrim and azithromycin. 5. 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 He continues to have intermittent numbness in the feet. 6. History of hypokalemia -he is no longer taking a potassium supplement 7. Pneumonia 01/06/2009 -- requiring hospitalization  01/10/2011 -- status post broad spectrum antibiotic therapy with clinical resolution. 8. Upper respiratory infection in February, 2011 -- likely a viral URI.  9. Right shoulder/upper arm pain. Plain x-ray of the right shoulder/humerus on 10/16/2011 showed 2 small lytic lesions at the proximal humeral shaft suspicious for myeloma involvement. MRI of the right shoulder 10/27/2011 confirmed multiple myelomatous lesions involving the proximal right humerus, scapula, and ribs. A large destructive lesion was noted in the scapular body with an associated soft tissue component. He completed palliative radiation to the right scapula on 11/19/2011 with partial improvement in the shoulder discomfort. 10. "Hemorrhoid" discomfort and urinary hesitancy on 11/15/2011. He appeared to have an ulcer or fissure at the anal verge. Symptoms have resolved. 11. Fever/sepsis/bacteremia/question pneumonia September 2013 following stem cell therapy. 12. Admission with pneumonia 09/16/2012-influenza A was confirmed on a bronchoscopy03/04/2013, he was treated with Tamiflu. 13. Abundant atypical squamous cells on the bronchial washing cytology from 09/21/2012-most likely secondary to reactive changes, no clinical history to suggest a diagnosis of non-small cell lung cancer. 15. Pain right upper arm. Plain x-ray 11/04/2012 with a 6 mm lytic lesion in the proximal humeral shaft. No fracture or dislocation. Improved following steroid injections and physical therapy.probable adhesive capsulitis-resolved  16. Cystic neck mass-status post incision and drainage 06/16/2013 followed by doxycycline  17. 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 He is undergoing a transplant evaluation at [facility].  Mr. Nicolosi is again admitted with pneumonia. Treatment for multiple myeloma is currently on hold secondary to  anemia/thrombocytopenia and the recent diagnosis of myelodysplasia. He is undergoing an evaluation for an allogeneic transplant at [facility].   Recommendations:  1.continue broad-spectrum antibiotics, followup cultures. Low threshold for infectious disease consult given his history of recurrent pneumonia and multiple antibiotic allergies, he has seen Dr. Megan Salon in the past  2. Hold aspirin with the thrombocytopenia  3. Revlimid to remain on hold  4. Outpatient allogeneic transplant evaluation in process  Please call oncology as needed. I appreciate the care from Dr. Rockne Menghini.      LOS: 1 day   Ladell Pier  11/04/2013, 9:18 AM

## 2013-11-04 NOTE — Care Management Note (Signed)
CARE MANAGEMENT NOTE 11/04/2013  Patient:  Chad [name], Chad [name]   Account Number:  [id number]  Date Initiated:  11/04/2013  Documentation initiated by:  Dominigue Gellner  Subjective/Objective Assessment:   58 yo male admitted with fever of unknown origin.     Action/Plan:   Home when stable   Anticipated DC Date:     Anticipated DC Plan:  [place]  CM consult      Choice offered to / List presented to:  NA   DME arranged  NA      DME agency  NA     [place] arranged  NA      [place] agency  NA   Status of service:  In process, will continue to follow Medicare Important Message given?   (If response is "NO", the following Medicare IM given date fields will be blank) Date Medicare IM given:   Date Additional Medicare IM given:    Discharge Disposition:    Per UR Regulation:  Reviewed for med. necessity/level of care/duration of stay  If discussed at [place] of Stay Meetings, dates discussed:    Comments:  11/04/13 [place] Chart reviewed for utilization of services. No needs assessed at this time.

## 2013-11-04 NOTE — Progress Notes (Signed)
Patient admitted from ED with fever. Oriented to room and unit.

## 2013-11-04 NOTE — Progress Notes (Signed)
Flu pcr negative.  Droplet precautions d/c. MD aware. Patient informed.

## 2013-11-04 NOTE — Progress Notes (Signed)
Progress Note   Chad [name] TMH:[id number] DOB: 08/08/1955 DOA: 11/03/2013 PCP: Gwendolyn Grant, MD   Brief Narrative:   Chad [name] is an 58 y.o. 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 Evaluation in the ER was significant for a negative chest x-ray and a WBC count that was WNL.  Assessment/Plan:   Principal Problem: Healthcare associated pneumonia in an immunosuppressed patient with fever  Patient is chronically immunosuppressed and therefore was started on empiric vancomycin and aztreonam.  Diagnostic workup including blood cultures, urine culture, influenza/virus panel, and CT scan of the chest was ordered on admission. CT scan of the chest did show a lingular focal pneumonia.  Continue current antibiotics and antipyretics. Active Problems: Hypokalemia  We'll add potassium to IV fluids. MULTIPLE  MYELOMA  Saw Dr. Benay Spice 08/09/13 with recommendations to continue maintenance Revlimid.  Dr. Benay Spice notified of the patient's admission. HYPERCHOLESTEROLEMIA  Fasting lipid panel shows a cholesterol of 124, and LDL of 58. Not on active therapy. HYPERTENSION  Not being treated at present. Blood pressure mostly controlled. CORONARY ARTERY DISEASE  Continue aspirin. Thrombocytopenia  Chronic, stable. DVT Prophylaxis  Continue SCDs.   Code Status: Full. Family Communication: No family at bedside. Disposition Plan: Home when stable.   IV Access:    Peripheral IV   Procedures:    None.   Medical Consultants:    None.   Other Consultants:    None.   Anti-Infectives:    Aztreonam 11/03/13--->  Vancomycin 11/03/13--->  Subjective:   Chad [name] is without complaint.  No dyspnea or cough.  No nausea or vomiting.  Doesn't feel feverish.  Appetite  good.  Objective:    Filed Vitals:   11/03/13 2300 11/03/13 2331 11/04/13 0031 11/04/13 0645  BP: 144/69  157/75 128/64  Pulse: 91  96 87  Temp:  101.5 F (38.6 C) 100.2 F (37.9 C) 99.8 F (37.7 C)  TempSrc:   Oral Oral  Resp: 16  16 20   Height:   5' 11.5" (1.816 m)   Weight:   92.262 kg (203 lb 6.4 oz)   SpO2: 94%  98% 99%    Intake/Output Summary (Last 24 hours) at 11/04/13 9211 Last data filed at 11/04/13 0731  Gross per 24 hour  Intake    360 ml  Output    525 ml  Net   -165 ml    Exam: Gen:  NAD Cardiovascular:  RRR, No M/R/G Respiratory:  Lungs CTAB Gastrointestinal:  Abdomen soft, NT/ND, + BS Extremities:  No C/E/C   Data Reviewed:    Labs: Basic Metabolic Panel:  Recent Labs Lab 11/03/13 1230 11/03/13 1800 11/03/13 1805 11/04/13 0410  NA 137  --  137 137  K 3.7  --  3.7 3.3*  CL  --   --  99 102  CO2 22  --  22 19  GLUCOSE 120  --  96 101*  BUN 12.0  --  13 10  CREATININE 1.1  --  0.99 1.00  CALCIUM 9.9  --  10.0 8.8  MG  --  2.1  --   --   PHOS  --  3.8  --   --    GFR Estimated Creatinine Clearance: 93.2 ml/min (by C-G formula based on Cr of 1). Liver Function Tests:  Recent Labs Lab 11/03/13 1230 11/03/13 1805  AST 19 20  ALT 14 17  ALKPHOS 60 56  BILITOT 1.75* 1.5*  PROT 8.2 8.0  ALBUMIN 4.2 4.0   CBC:  Recent Labs Lab 11/03/13 1230 11/03/13 1805 11/04/13 0410  WBC 6.7 7.6 4.6  NEUTROABS 5.3 5.5  --   HGB 9.4* 8.9* 7.9*  HCT 28.0* 26.3* 23.5*  MCV 95.2 96.0 95.1  PLT 57* 66* 44*   Lipid Profile:  Recent Labs  11/04/13 0410  CHOL 124  HDL 49  LDLCALC 58  TRIG 84  CHOLHDL 2.5   Sepsis Labs:  Recent Labs Lab 11/03/13 1230 11/03/13 1805 11/03/13 1823 11/04/13 0410  WBC 6.7 7.6  --  4.6  LATICACIDVEN  --   --  1.42  --    Microbiology No results found for this or any previous visit (from the past 240 hour(s)).   Radiographs/Studies:   Ct Chest W Contrast  11/04/2013   CLINICAL DATA:  Fever.  Elevated white cell count. History of chemotherapy in 2004. Multiple myeloma.  EXAM: CT CHEST WITH CONTRAST  TECHNIQUE: Multidetector CT imaging of the chest was performed during intravenous contrast administration.  CONTRAST:  25m OMNIPAQUE IOHEXOL 300 MG/ML  SOLN  COMPARISON:  DG CHEST 1V PORT dated 11/03/2013; CT CHEST W/CM dated 09/20/2012  FINDINGS: Normal heart size. Coronary artery calcifications. Normal caliber thoracic aorta. Scattered lymph nodes in the mediastinum are not pathologically enlarged. Esophagus is decompressed. Visualized portions of the upper abdominal organs are grossly unremarkable. There is scarring or dependent change in the lung bases. No evidence of any focal mass lesion in the lungs. There is focal consolidation demonstrated in the lingular segment of the left lung with air bronchograms. This is likely represent pneumonia. Bilateral infiltrates seen on the previous study have resolved in the interval. No pleural effusion or pneumothorax. Degenerative changes in the thoracic spine. Multiple lytic bone lesions demonstrated throughout the thoracic vertebrae, sternum, right scapula, and ribs compatible with known myeloma. Bone lesions appear similar to previous study.  IMPRESSION: Focal areas consolidation in the lingular segment of the left lung likely to represent focal pneumonia. Multiple lucent bone lesions consistent with known multiple myeloma.   Electronically Signed   By: WLucienne CapersM.D.   On: 11/04/2013 00:05   Ct Biopsy  10/07/2013   CLINICAL DATA:  Multiple myeloma  EXAM: CT GUIDED RIGHT ILIAC BONE MARROW ASPIRATION AND CORE BIOPSY  Date:  3/26/20153/26/2015 11:12 AM  Radiologist:  MJerilynn Mages TDaryll Brod MD  Guidance:  CT  FLUOROSCOPY TIME:  NONE.  MEDICATIONS AND MEDICAL HISTORY: 1.5 MG VERSED, 75 MCG FENTANYL  ANESTHESIA/SEDATION: 15 min  CONTRAST:  NONE.  COMPLICATIONS: None  PROCEDURE: Informed consent was obtained from the patient following explanation of the procedure,  risks, benefits and alternatives. The patient understands, agrees and consents for the procedure. All questions were addressed. A time out was performed.  The patient was positioned prone and noncontrast localization CT was performed of the pelvis to demonstrate the iliac marrow spaces.  Maximal barrier sterile technique utilized including caps, mask, sterile gowns, sterile gloves, large sterile drape, hand hygiene, and betadine prep.  Under sterile conditions and local anesthesia, an 11 gauge coaxial bone biopsy needle was advanced into the right iliac marrow space. Needle position was confirmed with CT imaging. Initially, bone marrow aspiration was performed. Next, the 11 gauge outer cannula was utilized to obtain a right iliac bone marrow core biopsy. Needle was removed. Hemostasis was obtained with compression. The patient tolerated the procedure well.  Samples were prepared with the cytotechnologist. No immediate complications.  IMPRESSION: CT guided right iliac bone marrow aspiration and core biopsy.   Electronically Signed   By: Daryll Brod M.D.   On: 10/07/2013 11:35   Dg Chest Port 1 View  11/03/2013   CLINICAL DATA:  Fever  EXAM: PORTABLE CHEST - 1 VIEW  COMPARISON:  DG CHEST 2 VIEW dated 09/11/2012; CT BIOPSY dated 10/07/2013  FINDINGS: Normal heart size. Clear lungs. No pneumothorax or pleural effusion.  IMPRESSION: No active cardiopulmonary disease.   Electronically Signed   By: Maryclare Bean M.D.   On: 11/03/2013 18:51   Dg Bone Survey Met  10/07/2013   CLINICAL DATA:  Multiple myeloma.  EXAM: METASTATIC BONE SURVEY  COMPARISON:  09/18/2012  FINDINGS: No convincing skull lesion on the current study.  Small lucencies are noted in the T5, T8 and T11 thoracic vertebra, with mild loss of disc height of the T8 vertebrae and mild intervening sclerosis in the T8 in T11 vertebrae. This is stable. No new thoracic lesions.  Lucency with mild adjacent sclerosis is noted in the L5 vertebrae without loss of  height. This is also unchanged.  There is a well-defined lucent lesion in the right ilium approximately 3 cm above the roof of the right acetabulum. This has increased from the prior study. A discrete lucent lesion in the inferior right femoral neck is stable a smaller more subtle lesion in the left femoral neck is also stable.  Well-defined lucent lesion in the proximal humerus is stable. There is a smaller more subtle lesion below this which was likely present previously. It is more equivocal. Well-defined lucent lesion is noted in the proximal left humeral metaphysis, without change. Two tiny additional lesions are noted in the proximal left humerus, 1 of which appears new.  There are subtle bilateral rib lesions that are not well defined due to superimpose lung. These do not appear significantly changed. Fracture of the posterior lateral right seventh rib has healed.  Lungs are now clear.  IMPRESSION: 1. Mild progression of multiple myeloma. A lesion in the right ilium is larger or new since the prior study. One small new lesion is evident in the left proximal humerus. Other lesions along the spine, ribs an proximal femurs and humeri are stable.   Electronically Signed   By: Lajean Manes M.D.   On: 10/07/2013 09:38    Medications:   . aspirin  81 mg Oral Daily  . aztreonam  2 g Intravenous Q8H  . calcium-vitamin D  1 tablet Oral BID  . pantoprazole  40 mg Oral Q1200  . vancomycin  1,500 mg Intravenous Q12H   Continuous Infusions: . sodium chloride 1,000 mL (11/04/13 0635)    Time spent: 25 minutes.   LOS: 1 day   [place]  [facility] Pager [phone]. If unable to reach me by pager, please call my cell phone at [phone].  *Please refer to [email or web address], password [email or web address] to get updated schedule on who will round on this patient, as hospitalists switch teams weekly. If 7PM-7AM, please contact night-coverage at [email or web address], password [email or web address] for any overnight needs.  11/04/2013, 8:22  AM    **Disclaimer: This note was dictated with voice recognition software. Similar sounding words can inadvertently be transcribed and this note may contain transcription errors which may not have been corrected upon publication of note.**

## 2013-11-05 DIAGNOSIS — D469 Myelodysplastic syndrome, unspecified: Secondary | ICD-10-CM

## 2013-11-05 DIAGNOSIS — IMO0001 Reserved for inherently not codable concepts without codable children: Secondary | ICD-10-CM

## 2013-11-05 DIAGNOSIS — E876 Hypokalemia: Secondary | ICD-10-CM

## 2013-11-05 LAB — VANCOMYCIN, TROUGH: Vancomycin Tr: 8.7 ug/mL — ABNORMAL LOW (ref 10.0–20.0)

## 2013-11-05 MED ORDER — VANCOMYCIN HCL 10 G IV SOLR
1750.0000 mg | Freq: Two times a day (BID) | INTRAVENOUS | Status: DC
  Administered 2013-11-05 – 2013-11-07 (×5): 1750 mg via INTRAVENOUS
  Filled 2013-11-05 (×6): qty 1750

## 2013-11-05 NOTE — Progress Notes (Signed)
Progress Note   Chad [name] HYW:[id number] DOB: 05-21-1956 DOA: 11/03/2013 PCP: Gwendolyn Grant, MD   Brief Narrative:   Chad [name] is an 58 y.o. 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 Evaluation in the ER was significant for a negative chest x-ray and a WBC count that was WNL.  Assessment/Plan:   Principal Problem: Healthcare associated pneumonia in an immunosuppressed patient with fever  Patient is chronically immunosuppressed and therefore was started on empiric vancomycin and aztreonam.  Diagnostic workup including blood cultures, urine culture, influenza panel, and CT scan of the chest was ordered on admission. CT scan of the chest did show a lingular focal pneumonia. Influenza studies were negative. Urine culture was negative. Blood cultures are negative to date.  Continue current antibiotics and antipyretics.  ID consultation requested, case discussed with Dr. Megan Salon who will see the patient in consultation. Active Problems: Hypokalemia  Potassium added to IV fluids. MULTIPLE  MYELOMA  Saw Dr. Benay Spice 08/09/13 with recommendations to continue maintenance Revlimid.  Dr. Benay Spice aware of the patient's admission and is consulting. HYPERCHOLESTEROLEMIA  Fasting lipid panel shows a cholesterol of 124, and LDL of 58. Not on active therapy. HYPERTENSION  Not being treated at present. Blood pressure mostly controlled. CORONARY ARTERY DISEASE  Aspirin on hold secondary to thrombocytopenia. Thrombocytopenia  Chronic, stable. DVT Prophylaxis  Continue SCDs.   Code Status: Full. Family Communication: No family at bedside. Disposition Plan: Home when stable.   IV Access:    Peripheral IV   Procedures:    None.   Medical Consultants:    None.   Other Consultants:     None.   Anti-Infectives:    Aztreonam 11/03/13--->  Vancomycin 11/03/13--->  Subjective:   Chad [name] remains without complaint.  No dyspnea or cough.  No nausea or vomiting.  Doesn't feel feverish.  Appetite good. Bowels moved today, normal consistency.  Objective:    Filed Vitals:   11/04/13 2119 11/04/13 2330 11/05/13 0304 11/05/13 0504  BP: 151/80   110/64  Pulse: 89   82  Temp: 99.1 F (37.3 C) 100.8 F (38.2 C) 100.1 F (37.8 C) 98.8 F (37.1 C)  TempSrc: Oral Oral Oral Oral  Resp: 20   18  Height:      Weight:      SpO2: 100%   96%    Intake/Output Summary (Last 24 hours) at 11/05/13 1353 Last data filed at 11/05/13 1243  Gross per 24 hour  Intake 3903.75 ml  Output   1850 ml  Net 2053.75 ml    Exam: Gen:  NAD Cardiovascular:  RRR, No M/R/G Respiratory:  Lungs CTAB Gastrointestinal:  Abdomen soft, NT/ND, + BS Extremities:  No C/E/C   Data Reviewed:    Labs: Basic Metabolic Panel:  Recent Labs Lab 11/03/13 1230 11/03/13 1800 11/03/13 1805 11/04/13 0410  NA 137  --  137 137  K 3.7  --  3.7 3.3*  CL  --   --  99 102  CO2 22  --  22 19  GLUCOSE 120  --  96 101*  BUN 12.0  --  13 10  CREATININE 1.1  --  0.99 1.00  CALCIUM 9.9  --  10.0 8.8  MG  --  2.1  --   --   PHOS  --  3.8  --   --  GFR Estimated Creatinine Clearance: 93.2 ml/min (by C-G formula based on Cr of 1). Liver Function Tests:  Recent Labs Lab 11/03/13 1230 11/03/13 1805  AST 19 20  ALT 14 17  ALKPHOS 60 56  BILITOT 1.75* 1.5*  PROT 8.2 8.0  ALBUMIN 4.2 4.0   CBC:  Recent Labs Lab 11/03/13 1230 11/03/13 1805 11/04/13 0410  WBC 6.7 7.6 4.6  NEUTROABS 5.3 5.5  --   HGB 9.4* 8.9* 7.9*  HCT 28.0* 26.3* 23.5*  MCV 95.2 96.0 95.1  PLT 57* 66* 44*   Lipid Profile:  Recent Labs  11/04/13 0410  CHOL 124  HDL 49  LDLCALC 58  TRIG 84  CHOLHDL 2.5   Sepsis Labs:  Recent Labs Lab 11/03/13 1230 11/03/13 1805 11/03/13 1823 11/04/13 0410   WBC 6.7 7.6  --  4.6  LATICACIDVEN  --   --  1.42  --    Microbiology Recent Results (from the past 240 hour(s))  CULTURE, BLOOD (ROUTINE X 2)     Status: None   Collection Time    11/03/13  6:05 PM      Result Value Ref Range Status   Specimen Description BLOOD RIGHT ARM   Final   Special Requests BOTTLES DRAWN AEROBIC AND ANAEROBIC 5ML   Final   Culture  Setup Time     Final   Value: 11/03/2013 21:56     Performed at [facility]   Culture     Final   Value:        BLOOD CULTURE RECEIVED NO GROWTH TO DATE CULTURE WILL BE HELD FOR 5 DAYS BEFORE ISSUING A FINAL NEGATIVE REPORT     Performed at [facility]   Report Status PENDING   Incomplete  URINE CULTURE     Status: None   Collection Time    11/03/13  6:14 PM      Result Value Ref Range Status   Specimen Description URINE, CLEAN CATCH   Final   Special Requests NONE   Final   Culture  Setup Time     Final   Value: 11/03/2013 22:35     Performed at [facility]     Final   Value: NO GROWTH     Performed at [facility]   Culture     Final   Value: NO GROWTH     Performed at [facility]   Report Status 11/04/2013 FINAL   Final  CULTURE, BLOOD (ROUTINE X 2)     Status: None   Collection Time    11/03/13  6:15 PM      Result Value Ref Range Status   Specimen Description BLOOD RIGHT HAND   Final   Special Requests BOTTLES DRAWN AEROBIC ONLY 3ML   Final   Culture  Setup Time     Final   Value: 11/03/2013 21:55     Performed at [facility]   Culture     Final   Value:        BLOOD CULTURE RECEIVED NO GROWTH TO DATE CULTURE WILL BE HELD FOR 5 DAYS BEFORE ISSUING A FINAL NEGATIVE REPORT     Performed at [facility]   Report Status PENDING   Incomplete     Radiographs/Studies:   Ct Chest W Contrast  11/04/2013   CLINICAL DATA:  Fever. Elevated white cell count. History of chemotherapy in 2004. Multiple myeloma.  EXAM: CT CHEST WITH CONTRAST  TECHNIQUE: Multidetector CT imaging of the chest was performed during intravenous contrast administration.  CONTRAST:  14m OMNIPAQUE IOHEXOL 300 MG/ML  SOLN  COMPARISON:  DG CHEST 1V PORT dated 11/03/2013; CT CHEST W/CM dated 09/20/2012  FINDINGS: Normal heart size. Coronary artery calcifications. Normal caliber thoracic aorta. Scattered lymph nodes in the mediastinum are not pathologically enlarged. Esophagus is decompressed. Visualized portions of the upper abdominal organs are grossly unremarkable. There is scarring or dependent change in the lung bases. No evidence of any focal mass lesion in the lungs. There is focal consolidation demonstrated in the lingular segment of the left lung with air bronchograms. This is likely represent pneumonia. Bilateral infiltrates seen on the previous study have resolved in the interval. No pleural effusion or pneumothorax. Degenerative changes in the thoracic spine. Multiple lytic bone lesions demonstrated throughout the thoracic vertebrae, sternum, right scapula, and ribs compatible with known myeloma. Bone lesions appear similar to previous study.  IMPRESSION: Focal areas consolidation in the lingular segment of the left lung likely to represent focal pneumonia. Multiple lucent bone lesions consistent with known multiple myeloma.   Electronically Signed   By: WLucienne CapersM.D.   On: 11/04/2013 00:05   Ct Biopsy  10/07/2013   CLINICAL DATA:  Multiple myeloma  EXAM: CT GUIDED RIGHT ILIAC BONE MARROW ASPIRATION AND CORE BIOPSY  Date:  3/26/20153/26/2015 11:12 AM  Radiologist:  MJerilynn Mages TDaryll Brod MD  Guidance:  CT  FLUOROSCOPY TIME:  NONE.  MEDICATIONS AND MEDICAL HISTORY: 1.5 MG VERSED, 75 MCG FENTANYL  ANESTHESIA/SEDATION: 15 min  CONTRAST:  NONE.  COMPLICATIONS: None  PROCEDURE: Informed consent was obtained from the patient following explanation of the procedure, risks, benefits and alternatives. The patient understands, agrees and consents for the procedure. All questions  were addressed. A time out was performed.  The patient was positioned prone and noncontrast localization CT was performed of the pelvis to demonstrate the iliac marrow spaces.  Maximal barrier sterile technique utilized including caps, mask, sterile gowns, sterile gloves, large sterile drape, hand hygiene, and betadine prep.  Under sterile conditions and local anesthesia, an 11 gauge coaxial bone biopsy needle was advanced into the right iliac marrow space. Needle position was confirmed with CT imaging. Initially, bone marrow aspiration was performed. Next, the 11 gauge outer cannula was utilized to obtain a right iliac bone marrow core biopsy. Needle was removed. Hemostasis was obtained with compression. The patient tolerated the procedure well. Samples were prepared with the cytotechnologist. No immediate complications.  IMPRESSION: CT guided right iliac bone marrow aspiration and core biopsy.   Electronically Signed   By: TDaryll BrodM.D.   On: 10/07/2013 11:35   Dg Chest Port 1 View  11/03/2013   CLINICAL DATA:  Fever  EXAM: PORTABLE CHEST - 1 VIEW  COMPARISON:  DG CHEST 2 VIEW dated 09/11/2012; CT BIOPSY dated 10/07/2013  FINDINGS: Normal heart size. Clear lungs. No pneumothorax or pleural effusion.  IMPRESSION: No active cardiopulmonary disease.   Electronically Signed   By: AMaryclare BeanM.D.   On: 11/03/2013 18:51   Dg Bone Survey Met  10/07/2013   CLINICAL DATA:  Multiple myeloma.  EXAM: METASTATIC BONE SURVEY  COMPARISON:  09/18/2012  FINDINGS: No convincing skull lesion on the current study.  Small lucencies are noted in the T5, T8 and T11 thoracic vertebra, with mild loss of disc height of the T8 vertebrae and mild intervening sclerosis in the T8 in T11 vertebrae. This is stable. No new thoracic lesions.  Lucency  with mild adjacent sclerosis is noted in the L5 vertebrae without loss of height. This is also unchanged.  There is a well-defined lucent lesion in the right ilium approximately 3 cm above the  roof of the right acetabulum. This has increased from the prior study. A discrete lucent lesion in the inferior right femoral neck is stable a smaller more subtle lesion in the left femoral neck is also stable.  Well-defined lucent lesion in the proximal humerus is stable. There is a smaller more subtle lesion below this which was likely present previously. It is more equivocal. Well-defined lucent lesion is noted in the proximal left humeral metaphysis, without change. Two tiny additional lesions are noted in the proximal left humerus, 1 of which appears new.  There are subtle bilateral rib lesions that are not well defined due to superimpose lung. These do not appear significantly changed. Fracture of the posterior lateral right seventh rib has healed.  Lungs are now clear.  IMPRESSION: 1. Mild progression of multiple myeloma. A lesion in the right ilium is larger or new since the prior study. One small new lesion is evident in the left proximal humerus. Other lesions along the spine, ribs an proximal femurs and humeri are stable.   Electronically Signed   By: Lajean Manes M.D.   On: 10/07/2013 09:38    Medications:   . aztreonam  2 g Intravenous Q8H  . calcium-vitamin D  1 tablet Oral BID  . pantoprazole  40 mg Oral Q1200  . vancomycin  1,750 mg Intravenous Q12H   Continuous Infusions: . 0.9 % NaCl with KCl 20 mEq / L 1,000 mL (11/05/13 0703)    Time spent: 25 minutes.   LOS: 2 days   [place]  [facility] Pager [phone]. If unable to reach me by pager, please call my cell phone at [phone].  *Please refer to [email or web address], password [email or web address] to get updated schedule on who will round on this patient, as hospitalists switch teams weekly. If 7PM-7AM, please contact night-coverage at [email or web address], password [email or web address] for any overnight needs.  11/05/2013, 1:53 PM    **Disclaimer: This note was dictated with voice recognition software. Similar sounding words can inadvertently be  transcribed and this note may contain transcription errors which may not have been corrected upon publication of note.**   Information printed out and reviewed with the patient/family:     In an effort to keep you and your family informed about your hospital stay, I am providing you with this information sheet. If you or your family have any questions, please do not hesitate to have the nursing staff page me to set up a meeting time.  Kayveon Lennartz Mohamed 11/05/2013 2 (Number of days in the hospital)  Treatment team:  Dr. Jacquelynn Cree, Hospitalist (Internist)  Dr. Julieanne Manson, Oncologist  Dr. Michel Bickers, Infectious Disease specialist  Active Treatment Issues with Plan: Principal Problem: Pneumonia  You are being treated with antibiotics: vancomycin and aztreonam.  Diagnostic workup including blood cultures, urine culture, influenza panel, and CT scan of the chest was ordered on admission. CT scan of the chest did show a focal pneumonia. Influenza studies were negative. Urine culture was negative. Blood cultures are negative to date.  Dr. Megan Salon will see you today at make further recommendations about antibiotic choice. Active Problems: Low potassium  Potassium added to IV fluids. MULTIPLE  MYELOMA  Dr. Benay Spice aware of your admission and is consulting. History of high cholesterol  Fasting lipid panel shows  a cholesterol of 124, and LDL of 58. No need for therapy. High blood pressure  Not being treated at present. Blood pressure mostly controlled. History of CORONARY ARTERY DISEASE  Aspirin on hold secondary to low platelet count. Low platelet count  Watch for bleeding problems. Blood clot prevention  Compression devices ordered.  Anticipated discharge date: Depends on when your fever breaks.

## 2013-11-05 NOTE — Progress Notes (Signed)
IP PROGRESS NOTE  Subjective:   [name] feels better. No cough. He is ambulating in the [place].  Objective: Vital signs in last 24 hours: Blood pressure 137/65, pulse 89, temperature 99.9 F (37.7 C), temperature source Oral, resp. rate 18, height 5' 11.5" (1.816 m), weight 203 lb 6.4 oz (92.262 kg), SpO2 100.00%.  Intake/Output from previous day: 04/23 0701 - 04/24 0700 In: 3722.5 [P.O.:1500; I.V.:1122.5; IV Piggyback:1100] Out: 2475 [Urine:2475]  Physical Exam:  HEENT: No thrush Lungs: Inspiratory rhonchi at the bilateral  lower posterior chest, no respiratory distress Cardiac: Regular rate and rhythm Abdomen: No hepatosplenomegaly Extremities: No leg edema     Lab Results:  Recent Labs  11/03/13 1805 11/04/13 0410  WBC 7.6 4.6  HGB 8.9* 7.9*  HCT 26.3* 23.5*  PLT 66* 44*    BMET  Recent Labs  11/03/13 1805 11/04/13 0410  NA 137 137  K 3.7 3.3*  CL 99 102  CO2 22 19  GLUCOSE 96 101*  BUN 13 10  CREATININE 0.99 1.00  CALCIUM 10.0 8.8   11/03/2013-serum M spike not detected  Studies/Results: Ct Chest W Contrast  11/04/2013   CLINICAL DATA:  Fever. Elevated white cell count. History of chemotherapy in 2004. Multiple myeloma.  EXAM: CT CHEST WITH CONTRAST  TECHNIQUE: Multidetector CT imaging of the chest was performed during intravenous contrast administration.  CONTRAST:  66m OMNIPAQUE IOHEXOL 300 MG/ML  SOLN  COMPARISON:  DG CHEST 1V PORT dated 11/03/2013; CT CHEST W/CM dated 09/20/2012  FINDINGS: Normal heart size. Coronary artery calcifications. Normal caliber thoracic aorta. Scattered lymph nodes in the mediastinum are not pathologically enlarged. Esophagus is decompressed. Visualized portions of the upper abdominal organs are grossly unremarkable. There is scarring or dependent change in the lung bases. No evidence of any focal mass lesion in the lungs. There is focal consolidation demonstrated in the lingular segment of the left lung with air bronchograms.  This is likely represent pneumonia. Bilateral infiltrates seen on the previous study have resolved in the interval. No pleural effusion or pneumothorax. Degenerative changes in the thoracic spine. Multiple lytic bone lesions demonstrated throughout the thoracic vertebrae, sternum, right scapula, and ribs compatible with known myeloma. Bone lesions appear similar to previous study.  IMPRESSION: Focal areas consolidation in the lingular segment of the left lung likely to represent focal pneumonia. Multiple lucent bone lesions consistent with known multiple myeloma.   Electronically Signed   By: WLucienne CapersM.D.   On: 11/04/2013 00:05   Dg Chest Port 1 View  11/03/2013   CLINICAL DATA:  Fever  EXAM: PORTABLE CHEST - 1 VIEW  COMPARISON:  DG CHEST 2 VIEW dated 09/11/2012; CT BIOPSY dated 10/07/2013  FINDINGS: Normal heart size. Clear lungs. No pneumothorax or pleural effusion.  IMPRESSION: No active cardiopulmonary disease.   Electronically Signed   By: AMaryclare BeanM.D.   On: 11/03/2013 18:51    Medications: I have reviewed the patient's current medications.  Assessment/Plan:  1. Multiple myeloma-status post high-dose chemotherapy with autologous stem cell support in November of 2004. The sero monoclonal protein was slowly rising and a restaging bone marrow biopsy in November 2009 confirmed 10 to 20% plasma cells. The serum light chains were elevated 07/25/2008. He completed 12 cycles of salvage therapy with Revlimid/Decadron with the last cycle initiated 06/16/2009. On 07/13/2009 the serum free light chains were normal and a serum protein electrophoresis/immunofixation revealed no monoclonal protein. Elevated serum M spike and serum free kappa light chains March 2013. He began  cycle 1 of Revlimid 25 mg daily x21 days followed by a 7 day break on 09/27/2011. He began a second cycle of salvage Revlimid beginning on 10/25/2011. The serum M spike was stable and the serum free kappa light chains were slightly more  elevated on 11/19/2011. He began another cycle of Revlimid/Decadron on 11/22/2011. Velcade was added beginning on 11/29/2011. He completed 3 cycles of Revlimid/Velcade/Decadron with improvement in the serum M spike/serum free kappa light chains. The last cycle was initiated on 01/20/2012. He received melphalan 200 mg per meter squared on 03/17/2012 with autologous stem cell support on 03/18/2012. Serum immunofixation confirmed a monoclonal IgG kappa protein, too low to quantify and a mild elevation of the serum free kappa light chains on 05/19/2012. He began maintenance Revlimid on 07/11/2012. Serum M spike 0.25, serum free kappa light chains 3.04 on 08/07/2012. Revlimid was placed on hold during the 09/16/2012 hospital admission. Serum M spike 0.45 and serum free kappa light chains 1.83 on 10/21/2012. Revlimid was resumed 11/04/2012. Revlimid was placed on hold 09/07/2013 secondary to progressive anemia 2. A serum immunofixation confirmed a monoclonal protein and the serum Kappa and Lambda light chains were mildly elevated in June of 2012  3. History of elevated liver enzymes. 4. Right lung pneumonia June, 2009 -- resolved after treatment with Bactrim and azithromycin. 5. 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 He continues to have intermittent numbness in the feet. 6. History of hypokalemia -he is no longer taking a potassium supplement 7. Pneumonia 01/06/2009 -- requiring hospitalization 01/10/2011 -- status post broad spectrum antibiotic therapy with clinical resolution. 8. Upper respiratory infection in February, 2011 -- likely a viral URI.  9. Right shoulder/upper arm pain. Plain x-ray of the right shoulder/humerus on 10/16/2011 showed 2 small lytic lesions at the proximal humeral shaft suspicious for myeloma involvement. MRI of the right shoulder 10/27/2011 confirmed multiple myelomatous lesions involving the proximal right humerus, scapula,  and ribs. A large destructive lesion was noted in the scapular body with an associated soft tissue component. He completed palliative radiation to the right scapula on 11/19/2011 with partial improvement in the shoulder discomfort. 10. "Hemorrhoid" discomfort and urinary hesitancy on 11/15/2011. He appeared to have an ulcer or fissure at the anal verge. Symptoms have resolved. 11. Fever/sepsis/bacteremia/question pneumonia September 2013 following stem cell therapy. 12. Admission with pneumonia 09/16/2012-influenza A was confirmed on a bronchoscopy03/04/2013, he was treated with Tamiflu. 13. Abundant atypical squamous cells on the bronchial washing cytology from 09/21/2012-most likely secondary to reactive changes, no clinical history to suggest a diagnosis of non-small cell lung cancer. 15. Pain right upper arm. Plain x-ray 11/04/2012 with a 6 mm lytic lesion in the proximal humeral shaft. No fracture or dislocation. Improved following steroid injections and physical therapy.probable adhesive capsulitis-resolved  16. Cystic neck mass-status post incision and drainage 06/16/2013 followed by doxycycline  17. 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 He is undergoing a transplant evaluation at [facility].   [name] appears stable.  Recommendations:  1.continue broad-spectrum antibiotics, followup cultures. Low threshold for infectious disease consult given his history of recurrent pneumonia and multiple antibiotic allergies, he has seen Dr. Megan Salon in the past  2. Hold aspirin with the thrombocytopenia  3. Revlimid to remain on hold  4. Outpatient allogeneic transplant evaluation in process  Please call oncology as needed over the weekend. [facility]. I will see him 11/08/2013 if he remains in the hospital.       LOS: 2 days   Ladell Pier  11/05/2013, 3:10 PM

## 2013-11-05 NOTE — Progress Notes (Signed)
ANTIBIOTIC CONSULT NOTE - FOLLOW UP  Pharmacy Consult for Vancomycin Indication: HCAP  Allergies  Allergen Reactions  . Primaxin [Imipenem] Rash  . Cephalosporins     Reaction unknown  . Lisinopril     REACTION: ALLERGIC to ACE w/ cough...  . Moxifloxacin     Swollen lips  . Penicillins     rash  . Valsartan     REACTION: ALLERGIC to ARB w/ Urticaria \T\ Angioedema    Patient Measurements: Height: 5' 11.5" (181.6 cm) Weight: 203 lb 6.4 oz (92.262 kg) IBW/kg (Calculated) : 76.45   Vital Signs: Temp: 98.8 F (37.1 C) (04/24 0504) Temp src: Oral (04/24 0504) BP: 110/64 mmHg (04/24 0504) Pulse Rate: 82 (04/24 0504) Intake/Output from previous day: 04/23 0701 - 04/24 0700 In: 3722.5 [P.O.:1500; I.V.:1122.5; IV Piggyback:1100] Out: 2475 [Urine:2475] Intake/Output from this shift: Total I/O In: 681.3 [I.V.:681.3] Out: -   Labs:  Recent Labs  11/03/13 1230 11/03/13 1230 11/03/13 1805 11/04/13 0410  WBC 6.7  --  7.6 4.6  HGB 9.4*  --  8.9* 7.9*  PLT 57*  --  66* 44*  CREATININE  --  1.1 0.99 1.00   Estimated Creatinine Clearance: 93.2 ml/min (by C-G formula based on Cr of 1).  Recent Labs  11/05/13 0912  VANCOTROUGH 8.7*     Microbiology: Recent Results (from the past 720 hour(s))  CULTURE, BLOOD (ROUTINE X 2)     Status: None   Collection Time    11/03/13  6:05 PM      Result Value Ref Range Status   Specimen Description BLOOD RIGHT ARM   Final   Special Requests BOTTLES DRAWN AEROBIC AND ANAEROBIC 5ML   Final   Culture  Setup Time     Final   Value: 11/03/2013 21:56     Performed at [facility]   Culture     Final   Value:        BLOOD CULTURE RECEIVED NO GROWTH TO DATE CULTURE WILL BE HELD FOR 5 DAYS BEFORE ISSUING A FINAL NEGATIVE REPORT     Performed at [facility]   Report Status PENDING   Incomplete  URINE CULTURE     Status: None   Collection Time    11/03/13  6:14 PM      Result Value Ref Range Status   Specimen  Description URINE, CLEAN CATCH   Final   Special Requests NONE   Final   Culture  Setup Time     Final   Value: 11/03/2013 22:35     Performed at [facility]     Final   Value: NO GROWTH     Performed at [facility]   Culture     Final   Value: NO GROWTH     Performed at [facility]   Report Status 11/04/2013 FINAL   Final  CULTURE, BLOOD (ROUTINE X 2)     Status: None   Collection Time    11/03/13  6:15 PM      Result Value Ref Range Status   Specimen Description BLOOD RIGHT HAND   Final   Special Requests BOTTLES DRAWN AEROBIC ONLY 3ML   Final   Culture  Setup Time     Final   Value: 11/03/2013 21:55     Performed at [facility]   Culture     Final   Value:        BLOOD CULTURE  RECEIVED NO GROWTH TO DATE CULTURE WILL BE HELD FOR 5 DAYS BEFORE ISSUING A FINAL NEGATIVE REPORT     Performed at [facility]   Report Status PENDING   Incomplete    Anti-infectives   Start     Dose/Rate Route Frequency Ordered Stop   11/03/13 2359  aztreonam (AZACTAM) 2 g in dextrose 5 % 50 mL IVPB     2 g 100 mL/hr over 30 Minutes Intravenous Every 8 hours 11/03/13 2257     11/03/13 2300  vancomycin (VANCOCIN) 1,500 mg in sodium chloride 0.9 % 500 mL IVPB     1,500 mg 250 mL/hr over 120 Minutes Intravenous Every 12 hours 11/03/13 2239        Assessment:  58 y.o. male with PMH of hypertension, hyperlipidemia, CAD, multiple myeloma (status post stem cell transplant x2), myelodysplastic disease with chronic thrombocytopenia who was admitted 11/03/13 with fever of unknown margin. CT scan of the chest did show a lingular focal pneumonia.    Patient started on Vancomycin and Aztreonam on 4/22  Vancomycin trough obtained at [facility] is subtherapeutic at 8.7 (goal 15-20) On Vancomycin 1500 mg IV q12h.   Scr WNL on 4/23 with estimated CrCl 94 ml/min   WBC WNL  AF today   Urine culture 4/22 NG final; Blood culture 4/22 NGTD   Goal of  Therapy:  Vancomycin trough level 15-20 mcg/ml Azactam per renal function   Plan:  1.) Increase vancomycin to 1750 mg IV q8h, expect some accumulation with continued dosing 2.) Repeat vancomycin trough at [facility] 3.) Continue Azactam 2 grams IV q8h  Gaye Alken Zorian Gunderman PharmD Pager #: [phone] 10:16 AM 11/05/2013

## 2013-11-05 NOTE — Plan of Care (Signed)
Problem: Phase II Progression Outcomes Goal: Progress activity as tolerated unless otherwise ordered Outcome: Completed/Met Date Met:  11/05/13 2010 4/23 Patient ambulated in hallway with spouse and over to 3W.

## 2013-11-05 NOTE — Consult Note (Signed)
[facility]    Date of Admission:  11/03/2013           Day 3 vancomycin        Day 3 aztreonam       Reason for Consult: Fever and recurrent pneumonia    Referring Physician: Dr. Gerald Stabs Rama Primary Care Physician: Dr. Gwendolyn Grant  Principal Problem:   HCAP (healthcare-associated pneumonia) Active Problems:   Fever   MULTIPLE  MYELOMA   HYPERCHOLESTEROLEMIA   HYPERTENSION   CORONARY ARTERY DISEASE   Thrombocytopenia   Myelodysplasia   . aztreonam  2 g Intravenous Q8H  . calcium-vitamin D  1 tablet Oral BID  . pantoprazole  40 mg Oral Q1200  . vancomycin  1,750 mg Intravenous Q12H    Recommendations: 1. Continue current antibiotic regimen 2. My partner, Dr. Lita Mains [phone]) will followup tomorrow   Assessment: His temperature curve seems to be trending down. Some of this may be due to the concurrent use of antipyretics but he feels better and I think he may be responding to empiric antibiotic therapy and time. I favor continuing his current antibiotics and observing overnight. My partner, Dr. Lita Mains, saw him when he was hospitalized with pneumonia in March of 2014. I will ask him to followup tomorrow.   HPI: Chad [name] is a 58 y.o. male who has multiple myeloma and is status post 2 stents failed transplants. He was recently found to have myelodysplasia in his Revlimid was stopped. He has been feeling well but developed high fevers associated with mild chills 4 days ago. Blood work done at [facility] showed he had a normal white blood cell count. He was started on trimethoprim-sulfamethoxazole. He took one dose then was admitted later that night (4/22) because of high fever. He denies any other recent symptoms. His initial check x-ray will was unremarkable but a CT scan was done that showed a small left lingular infiltrate. He was started on empiric vancomycin and aztreonam because of a history of rash after  receiving imipenem and possible beta lactam allergies. Admission blood cultures are negative. He has been unable to produce any sputum for analyses. He is feeling better. He has been walking in the hallway without any shortness of breath and room air oxygen saturation has been normal. His wife and daughter feel like he is getting better.   Review of Systems: Pertinent items are noted in HPI.  Past Medical History  Diagnosis Date  . Unspecified essential hypertension   . Coronary atherosclerosis of unspecified type of vessel, native or graft   . Pure hypercholesterolemia   . Overweight   . Benign neoplasm of colon   . Multiple myeloma, without mention of having achieved remission 2004  . Urticaria, unspecified   . 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 Cancer 10/26/11 ct    multiple myelomatous lesion proximal right humerus,scaspula and ribs,  . History of chemotherapy 05/2003    high dose chemotherapy with autologus stem cell support/revlimid started 09/27/11  . Pneumonia 12/2007    hx  12/2007 & agian 01/06/09  . Allergy   . URI (upper respiratory infection) 08/2009    viral URI  . Neuropathy     hx intermittement tingling/numbness in feet r/t? revlimid, , resolved  . Neuropathy 10/22/11    intermittent tingling feet  again with revlimid therapy  . H/O cardiac catheterization 07/13/02  . H/O colonoscopy 04/2007  63m desc colon polyp =adenomatous  . [facility]  . Anemia   . Heart murmur     History  Substance Use Topics  . Smoking status: Never Smoker   . Smokeless tobacco: Never Used  . Alcohol Use: Yes     Comment: social etoh    Family History  Problem Relation Age of Onset  . Cancer Father 846   lymphoma  . Diabetes Mother   . Hypertension Mother    Allergies  Allergen Reactions  . Primaxin [Imipenem] Rash  . Cephalosporins     Reaction unknown  . Lisinopril     REACTION: ALLERGIC to ACE w/ cough...  .  Moxifloxacin     Swollen lips  . Penicillins     rash  . Valsartan     REACTION: ALLERGIC to ARB w/ Urticaria \T\ Angioedema    OBJECTIVE: Blood pressure 137/65, pulse 89, temperature 99.9 F (37.7 C), temperature source Oral, resp. rate 18, height 5' 11.5" (1.816 m), weight 203 lb 6.4 oz (92.262 kg), SpO2 100.00%. General: He is dressed in street clothes, sitting in his chair visiting with his wife and daughter Skin: No rash Oral: No oropharyngeal lesions Lungs: Clear Cor: Regular S1 and S2 no murmurs Abdomen: Soft and nontender Joints and extremities: Normal  Lab Results Lab Results  Component Value Date   WBC 4.6 11/04/2013   HGB 7.9* 11/04/2013   HCT 23.5* 11/04/2013   MCV 95.1 11/04/2013   PLT 44* 11/04/2013    Lab Results  Component Value Date   CREATININE 1.00 11/04/2013   BUN 10 11/04/2013   NA 137 11/04/2013   K 3.3* 11/04/2013   CL 102 11/04/2013   CO2 19 11/04/2013    Lab Results  Component Value Date   ALT 17 11/03/2013   AST 20 11/03/2013   ALKPHOS 56 11/03/2013   BILITOT 1.5* 11/03/2013     Microbiology: Recent Results (from the past 240 hour(s))  CULTURE, BLOOD (ROUTINE X 2)     Status: None   Collection Time    11/03/13  6:05 PM      Result Value Ref Range Status   Specimen Description BLOOD RIGHT ARM   Final   Special Requests BOTTLES DRAWN AEROBIC AND ANAEROBIC 5ML   Final   Culture  Setup Time     Final   Value: 11/03/2013 21:56     [facility]formed [facility]  Culture     Final   Value:        BLOOD CULTURE RECEIVED NO GROWTH TO DATE CULTURE WILL BE HELD FOR 5 DAYS BEFORE ISSUING A FINAL NEGATIVE REPORT     Performed at [facility]  Report Status PENDING   Incomplete  URINE CULTURE     Status: None   Collection Time    11/03/13  6:14 PM      Result Value Ref Range Status   Specimen Description URINE, CLEAN CATCH   Final   Special Requests NONE   Final   Culture  Setup Time     Final   Value: 11/03/2013 22:35     Performed at  [facility]    Final   Value: NO GROWTH     [facility]formed at [facility]  Culture     Final   Value: NO GROWTH     Performed at [facility]  Report Status 11/04/2013 FINAL   Final  CULTURE, BLOOD (ROUTINE X 2)     Status: None   Collection Time    11/03/13  6:15 PM      Result Value Ref Range Status   Specimen Description BLOOD RIGHT HAND   Final   Special Requests BOTTLES DRAWN AEROBIC ONLY 3ML   Final   Culture  Setup Time     Final   Value: 11/03/2013 21:55     Performed at [facility]   Culture     Final   Value:        BLOOD CULTURE RECEIVED NO GROWTH TO DATE CULTURE WILL BE HELD FOR 5 DAYS BEFORE ISSUING A FINAL NEGATIVE REPORT     Performed at [facility]   Report Status PENDING   Incomplete    Michel Bickers, MD [place] [facility] [phone] pager   [phone] cell 11/05/2013, 3:34 PM

## 2013-11-06 LAB — BASIC METABOLIC PANEL
BUN: 10 mg/dL (ref 6–23)
CHLORIDE: 105 meq/L (ref 96–112)
CO2: 21 mEq/L (ref 19–32)
Calcium: 8.5 mg/dL (ref 8.4–10.5)
Creatinine, Ser: 0.87 mg/dL (ref 0.50–1.35)
GFR calc non Af Amer: >90 mL/min (ref >90)
GLUCOSE: 100 mg/dL — AB (ref 70–99)
POTASSIUM: 4 meq/L (ref 3.7–5.3)
Sodium: 137 mEq/L (ref 137–147)

## 2013-11-06 LAB — CBC
HEMATOCRIT: 21.3 % — AB (ref 39.0–52.0)
HEMOGLOBIN: 7.4 g/dL — AB (ref 13.0–17.0)
MCH: 32.3 pg (ref 26.0–34.0)
MCHC: 34.7 g/dL (ref 30.0–36.0)
MCV: 93 fL (ref 78.0–100.0)
Platelets: 36 10*3/uL — ABNORMAL LOW (ref 150–400)
RBC: 2.29 MIL/uL — ABNORMAL LOW (ref 4.22–5.81)
RDW: 17.2 % — ABNORMAL HIGH (ref 11.5–15.5)
WBC: 4.1 10*3/uL (ref 4.0–10.5)

## 2013-11-06 NOTE — Progress Notes (Signed)
Progress Note   Chad [name] OEH:[id number] DOB: [birth year] DOA: 11/03/2013 PCP: Gwendolyn Grant, MD   Brief Narrative:   Chad [name] is an 58 y.o. 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 Evaluation in the ER was significant for a negative chest x-ray and a WBC count that was WNL.  Assessment/Plan:   Principal Problem: Healthcare associated pneumonia in an immunosuppressed patient with fever  Patient is chronically immunosuppressed and therefore was started on empiric vancomycin and aztreonam.  Diagnostic workup including blood cultures, urine culture, influenza panel, and CT scan of the chest was ordered on admission. CT scan of the chest did show a lingular focal pneumonia. Influenza studies were negative. Urine culture was negative. Blood cultures are negative to date.  Continue current antibiotics and antipyretics, as recommended by Dr. Megan Salon. Active Problems: Hypokalemia  Corrected with potassium added to IV fluids. MULTIPLE  MYELOMA  Saw Dr. Benay Spice 08/09/13 with recommendations to continue maintenance Revlimid (which is currently on hold).  Dr. Benay Spice aware of the patient's admission and is consulting. HYPERCHOLESTEROLEMIA  Fasting lipid panel shows a cholesterol of 124, and LDL of 58. Not on active therapy. HYPERTENSION  Not being treated at present. Blood pressure mostly controlled. CORONARY ARTERY DISEASE  Aspirin on hold secondary to thrombocytopenia. Thrombocytopenia  Chronic, stable. DVT Prophylaxis  Continue SCDs.   Code Status: Full. Family Communication: Wife updated at bedside. Disposition Plan: Home when stable.   IV Access:    Peripheral IV   Procedures:    None.   Medical Consultants:    None.   Other Consultants:     None.   Anti-Infectives:    Aztreonam 11/03/13--->  Vancomycin 11/03/13--->  Subjective:   Chad [name] remains without complaint.  States "I feel as good as they have for the last 3 days ". No dyspnea or cough.  Objective:    Filed Vitals:   11/05/13 2000 11/05/13 2105 11/06/13 0444 11/06/13 0540  BP:  123/65 142/76   Pulse: 84 92    Temp:  98 F (36.7 C) 100 F (37.8 C) 100.3 F (37.9 C)  TempSrc:  Oral Oral Oral  Resp:  18 20   Height:      Weight:      SpO2:  98% 100%     Intake/Output Summary (Last 24 hours) at 11/06/13 0920 Last data filed at 11/06/13 0700  Gross per 24 hour  Intake 3682.5 ml  Output   2475 ml  Net 1207.5 ml    Exam: Gen:  NAD Cardiovascular:  RRR, No M/R/G Respiratory:  Lungs CTAB Gastrointestinal:  Abdomen soft, NT/ND, + BS Extremities:  No C/E/C   Data Reviewed:    Labs: Basic Metabolic Panel:  Recent Labs Lab 11/03/13 1230 11/03/13 1800  11/03/13 1805 11/04/13 0410 11/06/13 0444  NA 137  --   --  137 137 137  K 3.7  --   < > 3.7 3.3* 4.0  CL  --   --   --  99 102 105  CO2 22  --   --  22 19 21   GLUCOSE 120  --   --  96 101* 100*  BUN 12.0  --   --  13 10 10   CREATININE 1.1  --   --  0.99 1.00 0.87  CALCIUM 9.9  --   --  10.0 8.8 8.5  MG  --  2.1  --   --   --   --   PHOS  --  3.8  --   --   --   --   < > = values in this interval not displayed. GFR Estimated Creatinine Clearance: 107.1 ml/min (by C-G formula based on Cr of 0.87). Liver Function Tests:  Recent Labs Lab 11/03/13 1230 11/03/13 1805  AST 19 20  ALT 14 17  ALKPHOS 60 56  BILITOT 1.75* 1.5*  PROT 8.2 8.0  ALBUMIN 4.2 4.0   CBC:  Recent Labs Lab 11/03/13 1230 11/03/13 1805 11/04/13 0410 11/06/13 0444  WBC 6.7 7.6 4.6 4.1  NEUTROABS 5.3 5.5  --   --   HGB 9.4* 8.9* 7.9* 7.4*  HCT 28.0* 26.3* 23.5* 21.3*  MCV 95.2 96.0 95.1 93.0  PLT 57* 66* 44* 36*   Lipid Profile:  Recent Labs  11/04/13 0410  CHOL 124  HDL 49  LDLCALC  58  TRIG 84  CHOLHDL 2.5   Sepsis Labs:  Recent Labs Lab 11/03/13 1230 11/03/13 1805 11/03/13 1823 11/04/13 0410 11/06/13 0444  WBC 6.7 7.6  --  4.6 4.1  LATICACIDVEN  --   --  1.42  --   --    Microbiology Recent Results (from the past 240 hour(s))  CULTURE, BLOOD (ROUTINE X 2)     Status: None   Collection Time    11/03/13  6:05 PM      Result Value Ref Range Status   Specimen Description BLOOD RIGHT ARM   Final   Special Requests BOTTLES DRAWN AEROBIC AND ANAEROBIC 5ML   Final   Culture  Setup Time     Final   Value: 11/03/2013 21:56     Performed at [facility]   Culture     Final   Value:        BLOOD CULTURE RECEIVED NO GROWTH TO DATE CULTURE WILL BE HELD FOR 5 DAYS BEFORE ISSUING A FINAL NEGATIVE REPORT     Performed at [facility]   Report Status PENDING   Incomplete  URINE CULTURE     Status: None   Collection Time    11/03/13  6:14 PM      Result Value Ref Range Status   Specimen Description URINE, CLEAN CATCH   Final   Special Requests NONE   Final   Culture  Setup Time     Final   Value: 11/03/2013 22:35     Performed at [place]     Final   Value: NO GROWTH     Performed at [facility]   Culture     Final   Value: NO GROWTH     Performed at [facility]   Report Status 11/04/2013 FINAL   Final  CULTURE, BLOOD (ROUTINE X 2)     Status: None   Collection Time    11/03/13  6:15 PM      Result Value Ref Range Status   Specimen Description BLOOD RIGHT HAND   Final   Special Requests BOTTLES DRAWN AEROBIC ONLY 3ML   Final   Culture  Setup Time     Final   Value: 11/03/2013 21:55     Performed at [facility]   Culture     Final   Value:        BLOOD CULTURE RECEIVED NO GROWTH TO DATE CULTURE  WILL BE HELD FOR 5 DAYS BEFORE ISSUING A FINAL NEGATIVE REPORT     Performed at [facility]   Report Status PENDING   Incomplete     Radiographs/Studies:   Ct Chest W  Contrast  11/25/13   CLINICAL DATA:  Fever. Elevated white cell count. History of chemotherapy in 2004. Multiple myeloma.  EXAM: CT CHEST WITH CONTRAST  TECHNIQUE: Multidetector CT imaging of the chest was performed during intravenous contrast administration.  CONTRAST:  74m OMNIPAQUE IOHEXOL 300 MG/ML  SOLN  COMPARISON:  DG CHEST 1V PORT dated 11/03/2013; CT CHEST W/CM dated 09/20/2012  FINDINGS: Normal heart size. Coronary artery calcifications. Normal caliber thoracic aorta. Scattered lymph nodes in the mediastinum are not pathologically enlarged. Esophagus is decompressed. Visualized portions of the upper abdominal organs are grossly unremarkable. There is scarring or dependent change in the lung bases. No evidence of any focal mass lesion in the lungs. There is focal consolidation demonstrated in the lingular segment of the left lung with air bronchograms. This is likely represent pneumonia. Bilateral infiltrates seen on the previous study have resolved in the interval. No pleural effusion or pneumothorax. Degenerative changes in the thoracic spine. Multiple lytic bone lesions demonstrated throughout the thoracic vertebrae, sternum, right scapula, and ribs compatible with known myeloma. Bone lesions appear similar to previous study.  IMPRESSION: Focal areas consolidation in the lingular segment of the left lung likely to represent focal pneumonia. Multiple lucent bone lesions consistent with known multiple myeloma.   Electronically Signed   By: WLucienne CapersM.D.   On: 005/14/201500:05   Ct Biopsy  10/07/2013   CLINICAL DATA:  Multiple myeloma  EXAM: CT GUIDED RIGHT ILIAC BONE MARROW ASPIRATION AND CORE BIOPSY  Date:  3/26/20153/26/2015 11:12 AM  Radiologist:  MJerilynn Mages TDaryll Brod MD  Guidance:  CT  FLUOROSCOPY TIME:  NONE.  MEDICATIONS AND MEDICAL HISTORY: 1.5 MG VERSED, 75 MCG FENTANYL  ANESTHESIA/SEDATION: 15 min  CONTRAST:  NONE.  COMPLICATIONS: None  PROCEDURE: Informed consent was obtained from the  patient following explanation of the procedure, risks, benefits and alternatives. The patient understands, agrees and consents for the procedure. All questions were addressed. A time out was performed.  The patient was positioned prone and noncontrast localization CT was performed of the pelvis to demonstrate the iliac marrow spaces.  Maximal barrier sterile technique utilized including caps, mask, sterile gowns, sterile gloves, large sterile drape, hand hygiene, and betadine prep.  Under sterile conditions and local anesthesia, an 11 gauge coaxial bone biopsy needle was advanced into the right iliac marrow space. Needle position was confirmed with CT imaging. Initially, bone marrow aspiration was performed. Next, the 11 gauge outer cannula was utilized to obtain a right iliac bone marrow core biopsy. Needle was removed. Hemostasis was obtained with compression. The patient tolerated the procedure well. Samples were prepared with the cytotechnologist. No immediate complications.  IMPRESSION: CT guided right iliac bone marrow aspiration and core biopsy.   Electronically Signed   By: TDaryll BrodM.D.   On: 10/07/2013 11:35   Dg Chest Port 1 View  11/03/2013   CLINICAL DATA:  Fever  EXAM: PORTABLE CHEST - 1 VIEW  COMPARISON:  DG CHEST 2 VIEW dated 09/11/2012; CT BIOPSY dated 10/07/2013  FINDINGS: Normal heart size. Clear lungs. No pneumothorax or pleural effusion.  IMPRESSION: No active cardiopulmonary disease.   Electronically Signed   By: AMaryclare BeanM.D.   On: 11/03/2013 18:51   Dg Bone Survey Met  10/07/2013  CLINICAL DATA:  Multiple myeloma.  EXAM: METASTATIC BONE SURVEY  COMPARISON:  09/18/2012  FINDINGS: No convincing skull lesion on the current study.  Small lucencies are noted in the T5, T8 and T11 thoracic vertebra, with mild loss of disc height of the T8 vertebrae and mild intervening sclerosis in the T8 in T11 vertebrae. This is stable. No new thoracic lesions.  Lucency with mild adjacent sclerosis is  noted in the L5 vertebrae without loss of height. This is also unchanged.  There is a well-defined lucent lesion in the right ilium approximately 3 cm above the roof of the right acetabulum. This has increased from the prior study. A discrete lucent lesion in the inferior right femoral neck is stable a smaller more subtle lesion in the left femoral neck is also stable.  Well-defined lucent lesion in the proximal humerus is stable. There is a smaller more subtle lesion below this which was likely present previously. It is more equivocal. Well-defined lucent lesion is noted in the proximal left humeral metaphysis, without change. Two tiny additional lesions are noted in the proximal left humerus, 1 of which appears new.  There are subtle bilateral rib lesions that are not well defined due to superimpose lung. These do not appear significantly changed. Fracture of the posterior lateral right seventh rib has healed.  Lungs are now clear.  IMPRESSION: 1. Mild progression of multiple myeloma. A lesion in the right ilium is larger or new since the prior study. One small new lesion is evident in the left proximal humerus. Other lesions along the spine, ribs an proximal femurs and humeri are stable.   Electronically Signed   By: Lajean Manes M.D.   On: 10/07/2013 09:38    Medications:   . aztreonam  2 g Intravenous Q8H  . calcium-vitamin D  1 tablet Oral BID  . pantoprazole  40 mg Oral Q1200  . vancomycin  1,750 mg Intravenous Q12H   Continuous Infusions: . 0.9 % NaCl with KCl 20 mEq / L 75 mL/hr at 11/06/13 0130    Time spent: 25 minutes.   LOS: 3 days   [place]  [facility] Pager [phone]. If unable to reach me by pager, please call my cell phone at [phone].  *Please refer to [email or web address], password [email or web address] to get updated schedule on who will round on this patient, as hospitalists switch teams weekly. If 7PM-7AM, please contact night-coverage at [email or web address], password [email or web address] for any  overnight needs.  11/06/2013, 9:20 AM    **Disclaimer: This note was dictated with voice recognition software. Similar sounding words can inadvertently be transcribed and this note may contain transcription errors which may not have been corrected upon publication of note.**   Information printed out and reviewed with the patient/family:     In an effort to keep you and your family informed about your hospital stay, I am providing you with this information sheet. If you or your family have any questions, please do not hesitate to have the nursing staff page me to set up a meeting time.  Chad [name] 11/06/2013 3 (Number of days in the hospital)  Treatment team:  Dr. Jacquelynn Cree, Hospitalist (Internist)  Dr. Julieanne Manson, Oncologist  Dr. Michel Bickers, Infectious Disease specialist  Active Treatment Issues with Plan: Principal Problem: Pneumonia  You are being treated with antibiotics: vancomycin and aztreonam.  Diagnostic workup including blood cultures, urine culture, influenza panel, and CT scan of the chest was ordered on admission. CT scan of  the chest did show a focal pneumonia. Influenza studies were negative. Urine culture was negative. Blood cultures are negative to date.  If you remain without further fevers, the ID doctors recommend that we send him home on doxycycline tomorrow. Active Problems: Low potassium  Potassium normal today. MULTIPLE  MYELOMA  Dr. Benay Spice aware of your admission and is consulting. History of high cholesterol  Fasting lipid panel shows a cholesterol of 124, and LDL of 58. No need for therapy. High blood pressure  Not being treated at present. Blood pressure mostly controlled. History of CORONARY ARTERY DISEASE  Aspirin on hold secondary to low platelet count. Low platelet count  Watch for bleeding problems. Blood clot prevention  Compression devices ordered.  Anticipated discharge date: Possibly  tomorrow.

## 2013-11-06 NOTE — Progress Notes (Addendum)
INFECTIOUS DISEASE PROGRESS NOTE  ID: Chad [name] is a 58 y.o. male with  Principal Problem:   HCAP (healthcare-associated pneumonia) Active Problems:   MULTIPLE  MYELOMA   HYPERCHOLESTEROLEMIA   HYPERTENSION   CORONARY ARTERY DISEASE   Thrombocytopenia   Fever   Myelodysplasia  Subjective: Without complaints  Abtx:  Anti-infectives   Start     Dose/Rate Route Frequency Ordered Stop   11/05/13 1030  vancomycin (VANCOCIN) 1,750 mg in sodium chloride 0.9 % 500 mL IVPB     1,750 mg 250 mL/hr over 120 Minutes Intravenous Every 12 hours 11/05/13 1027     11/03/13 2359  aztreonam (AZACTAM) 2 g in dextrose 5 % 50 mL IVPB     2 g 100 mL/hr over 30 Minutes Intravenous Every 8 hours 11/03/13 2257     11/03/13 2300  vancomycin (VANCOCIN) 1,500 mg in sodium chloride 0.9 % 500 mL IVPB  Status:  Discontinued     1,500 mg 250 mL/hr over 120 Minutes Intravenous Every 12 hours 11/03/13 2239 11/05/13 1027      Medications:  Scheduled: . aztreonam  2 g Intravenous Q8H  . calcium-vitamin D  1 tablet Oral BID  . pantoprazole  40 mg Oral Q1200  . vancomycin  1,750 mg Intravenous Q12H    Objective: Vital signs in last 24 hours: Temp:  [98 F (36.7 C)-102 F (38.9 C)] 100.3 F (37.9 C) (04/25 0540) Pulse Rate:  [84-92] 92 (04/24 2105) Resp:  [18-20] 20 (04/25 0444) BP: (123-142)/(65-76) 142/76 mmHg (04/25 0444) SpO2:  [98 %-100 %] 100 % (04/25 0444)   General appearance: alert, cooperative and no distress Eyes: negative findings: conjunctivae and sclerae normal and pupils equal, round, reactive to light and accomodation Throat: normal findings: oropharynx pink & moist without lesions or evidence of thrush Neck: no adenopathy and supple, symmetrical, trachea midline Resp: clear to auscultation bilaterally Cardio: regular rate and rhythm GI: normal findings: bowel sounds normal and soft, non-tender  Lab Results  Recent Labs  11/04/13 0410 11/06/13 0444  WBC 4.6 4.1  HGB  7.9* 7.4*  HCT 23.5* 21.3*  NA 137 137  K 3.3* 4.0  CL 102 105  CO2 19 21  BUN 10 10  CREATININE 1.00 0.87   Liver Panel  Recent Labs  11/03/13 1805  PROT 8.0  ALBUMIN 4.0  AST 20  ALT 17  ALKPHOS 56  BILITOT 1.5*   Sedimentation Rate No results found for this basename: ESRSEDRATE,  in the last 72 hours C-Reactive Protein No results found for this basename: CRP,  in the last 72 hours  Microbiology: Recent Results (from the past 240 hour(s))  CULTURE, BLOOD (ROUTINE X 2)     Status: None   Collection Time    11/03/13  6:05 PM      Result Value Ref Range Status   Specimen Description BLOOD RIGHT ARM   Final   Special Requests BOTTLES DRAWN AEROBIC AND ANAEROBIC 5ML   Final   Culture  Setup Time     Final   Value: 11/03/2013 21:56     Performed at [facility]   Culture     Final   Value:        BLOOD CULTURE RECEIVED NO GROWTH TO DATE CULTURE WILL BE HELD FOR 5 DAYS BEFORE ISSUING A FINAL NEGATIVE REPORT     Performed at [facility]   Report Status PENDING   Incomplete  URINE CULTURE  Status: None   Collection Time    11/03/13  6:14 PM      Result Value Ref Range Status   Specimen Description URINE, CLEAN CATCH   Final   Special Requests NONE   Final   Culture  Setup Time     Final   Value: 11/03/2013 22:35     Performed at [facility] Count     Final   Value: NO GROWTH     Performed at [facility]   Culture     Final   Value: NO GROWTH     Performed at [facility]   Report Status 11/04/2013 FINAL   Final  CULTURE, BLOOD (ROUTINE X 2)     Status: None   Collection Time    11/03/13  6:15 PM      Result Value Ref Range Status   Specimen Description BLOOD RIGHT HAND   Final   Special Requests BOTTLES DRAWN AEROBIC ONLY 3ML   Final   Culture  Setup Time     Final   Value: 11/03/2013 21:55     Performed at [facility]   Culture     Final   Value:        BLOOD CULTURE RECEIVED NO GROWTH TO DATE  CULTURE WILL BE HELD FOR 5 DAYS BEFORE ISSUING A FINAL NEGATIVE REPORT     Performed at [facility]   Report Status PENDING   Incomplete    Studies/Results: No results found.   Assessment/Plan: HCAP Fever Multiple Myeloma  Anemia, thrombocytopenia  Total days of antibiotics: 3 (aztreonam, vanco)  Temp 102 last pm He looks clinically well.  If he remains afebrile, would consider changing him to doxy tomorrow for out pt therapy.  BCx ngtd, UCx (-).           [facility] Ric[facility] Infect[facility] (pager) [phone] [email or web address] 11/06/2013, 3:14 PM  LOS: 3 days   **Disclaimer: This note may have been dictated with voice recognition software. Similar sounding words can inadvertently be transcribed and this note may contain transcription errors which may not have been corrected upon publication of note.**

## 2013-11-06 NOTE — Plan of Care (Signed)
Problem: Phase I Progression Outcomes Goal: Monitor temperature to keep under 100.5 Outcome: Progressing 11/05/13 Fever this evening Temp >102

## 2013-11-07 LAB — VANCOMYCIN, TROUGH: VANCOMYCIN TR: 11.5 ug/mL (ref 10.0–20.0)

## 2013-11-07 MED ORDER — DOXYCYCLINE HYCLATE 100 MG PO TABS
100.0000 mg | ORAL_TABLET | Freq: Once | ORAL | Status: AC
  Administered 2013-11-07: 100 mg via ORAL
  Filled 2013-11-07: qty 1

## 2013-11-07 MED ORDER — DOXYCYCLINE HYCLATE 50 MG PO CAPS: 50.0000 mg | ORAL_CAPSULE | Freq: Two times a day (BID) | ORAL | Status: DC

## 2013-11-07 NOTE — Progress Notes (Signed)
Patient discharged home, all discharge medications and instructions reviewed and questions answered. Patient to be assisted to vehicle by wheelchair.  

## 2013-11-07 NOTE — Discharge Instructions (Signed)

## 2013-11-07 NOTE — Discharge Summary (Signed)
Physician Discharge Summary  [name] OAC:[id number] DOB: March 08, 1956 DOA: 11/03/2013  PCP: Gwendolyn Grant, MD  Admit date: 11/03/2013 Discharge date: 11/07/2013   Recommendations for Outpatient Follow-Up:   1. Recommend close followup of CBC and followup on final blood culture results which are negative to date. 2. Note: Patient instructed to hold Revlimid until he follows up with Dr. Benay Spice.   Discharge Diagnosis:   Principal Problem:    HCAP (healthcare-associated pneumonia) Active Problems:    MULTIPLE  MYELOMA    HYPERCHOLESTEROLEMIA    HYPERTENSION    CORONARY ARTERY DISEASE    Thrombocytopenia    Fever    Myelodysplasia   Discharge Condition: Improved.  Diet recommendation: Low sodium, heart healthy.    History of Present Illness:   Chad [name] is an 58 y.o. 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 Evaluation in the ER was significant for a negative chest x-ray and a WBC count that was WNL.  Hospital Course by Problem:   Principal Problem:  Healthcare associated pneumonia in an immunosuppressed patient with fever  Patient is chronically immunosuppressed and therefore was started on empiric vancomycin and aztreonam to cover healthcare associated pneumonia.  Diagnostic workup including blood cultures, urine culture, influenza panel, and CT scan of the chest was ordered on admission. CT scan of the chest did show a lingular focal pneumonia. Influenza studies were negative. Urine culture was negative. Blood cultures are negative to date.  Discharge home on doxycycline therapy for one week per ID recommendations. Active Problems:  Hypokalemia  Corrected with potassium added to IV fluids. MULTIPLE MYELOMA  Saw Dr. Benay Spice as an outpatient on 08/09/13 with recommendations to  continue maintenance Revlimid (which is currently on hold).  Dr. Benay Spice aware of the patient's admission and is consulting. Patient instructed to continue to hold Revlimid until instructed to resume by Dr. Benay Spice. HYPERCHOLESTEROLEMIA  Fasting lipid panel shows a cholesterol of 124, and LDL of 58. Not on active therapy. HYPERTENSION  Not being treated at present. Blood pressure mostly controlled. CORONARY ARTERY DISEASE  Aspirin on hold secondary to thrombocytopenia. Thrombocytopenia  Chronic, stable. Instructed to hold his aspirin until Dr. Benay Spice tells him it is okay to resume.  Procedures:    None.   Medical Consultants:    Dr. Julieanne Manson, Oncology  Dr. Michel Bickers, ID   Discharge Exam:   Filed Vitals:   11/07/13 0610  BP: 148/82  Pulse: 89  Temp: 98.9 F (37.2 C)  Resp: 18   Filed Vitals:   11/06/13 0540 11/06/13 1500 11/06/13 2209 11/07/13 0610  BP:  134/72 148/72 148/82  Pulse:  90 89 89  Temp: 100.3 F (37.9 C) 99.9 F (37.7 C) 99.7 F (37.6 C) 98.9 F (37.2 C)  TempSrc: Oral Oral Oral Oral  Resp:  18 18 18   Height:      Weight:      SpO2:  100% 99% 99%    Gen:  NAD Cardiovascular:  RRR, No M/R/G Respiratory: Lungs CTAB Gastrointestinal: Abdomen soft, NT/ND with normal active bowel sounds. Extremities: No C/E/C    Discharge Instructions:       Discharge Orders   Future Appointments Provider Department Dept Phone   11/08/2013 8:30 AM [facility] [phone]   11/08/2013 9:00 AM Ladell Pier, [facility] [phone]   11/08/2013 9:45 AM [facility] [phone]   01/10/2014 8:00 AM Rowe Clack, MD [place] [phone]   Future Orders Complete By Expires   Call MD for:  temperature >100.4  As directed    Call MD for:  As directed    Scheduling Instructions:    Worsening shortness of breath or cough productive of yellow or green mucus or thick mucus that is hard to cough up   Diet - low sodium heart healthy  As directed    Discharge instructions  As directed    Increase activity slowly  As directed        Medication List    STOP taking these medications       lenalidomide 10 MG capsule  Commonly known as:  REVLIMID      TAKE these medications       aspirin 81 MG tablet  Take 81 mg by mouth daily.     Calcium 600-200 MG-UNIT per tablet  Take 1 tablet by mouth 2 (two) times daily.     doxycycline 50 MG capsule  Commonly known as:  VIBRAMYCIN  Take 1 capsule (50 mg total) by mouth 2 (two) times daily.     sulfamethoxazole-trimethoprim 800-160 MG per tablet  Commonly known as:  BACTRIM DS,SEPTRA DS  Take 1 tablet by mouth 2 (two) times daily.     ZOMETA IV  Inject 4 mg into the vein every 3 (three) months.       Follow-up Information   Schedule an appointment as soon as possible for a visit with Gwendolyn Grant, MD. (If symptoms worsen)    Specialty:  Internal Medicine   Contact information:   [address] [place] [address] [phone]       Follow up with Betsy Coder, MD. Schedule an appointment as soon as possible for a visit in 1 week.   Specialty:  Oncology   Contact information:   [place] [address] [phone]        The results of significant diagnostics from this hospitalization (including imaging, microbiology, ancillary and laboratory) are listed below for reference.     Significant Diagnostic Studies:   Radiographs: Ct Chest W Contrast  11/04/2013   CLINICAL DATA:  Fever. Elevated white cell count. History of chemotherapy in 2004. Multiple myeloma.  EXAM: CT CHEST WITH CONTRAST  TECHNIQUE: Multidetector CT imaging of the chest was performed during intravenous contrast administration.  CONTRAST:  38m OMNIPAQUE IOHEXOL 300 MG/ML  SOLN   COMPARISON:  DG CHEST 1V PORT dated 11/03/2013; CT CHEST W/CM dated 09/20/2012  FINDINGS: Normal heart size. Coronary artery calcifications. Normal caliber thoracic aorta. Scattered lymph nodes in the mediastinum are not pathologically enlarged. Esophagus is decompressed. Visualized portions of the upper abdominal organs are grossly unremarkable. There is scarring or dependent change in the lung bases. No evidence of any focal mass lesion in the lungs. There is focal consolidation demonstrated in the lingular segment of the left lung with air bronchograms. This is likely represent pneumonia. Bilateral infiltrates seen on the previous study have resolved in the interval. No pleural effusion or pneumothorax. Degenerative changes in the thoracic spine. Multiple lytic bone lesions demonstrated throughout the thoracic vertebrae, sternum, right scapula, and ribs compatible with known myeloma. Bone lesions appear similar to previous study.  IMPRESSION: Focal areas consolidation in the lingular segment of the  left lung likely to represent focal pneumonia. Multiple lucent bone lesions consistent with known multiple myeloma.   Electronically Signed   By: Lucienne Capers M.D.   On: 11/04/2013 00:05   Dg Chest Port 1 View  11/03/2013   CLINICAL DATA:  Fever  EXAM: PORTABLE CHEST - 1 VIEW  COMPARISON:  DG CHEST 2 VIEW dated 09/11/2012; CT BIOPSY dated 10/07/2013  FINDINGS: Normal heart size. Clear lungs. No pneumothorax or pleural effusion.  IMPRESSION: No active cardiopulmonary disease.   Electronically Signed   By: Maryclare Bean M.D.   On: 11/03/2013 18:51    Labs:  Basic Metabolic Panel:  Recent Labs Lab 11/03/13 1230 11/03/13 1800  11/03/13 1805 11/04/13 0410 11/06/13 0444  NA 137  --   --  137 137 137  K 3.7  --   < > 3.7 3.3* 4.0  CL  --   --   --  99 102 105  CO2 22  --   --  22 19 21   GLUCOSE 120  --   --  96 101* 100*  BUN 12.0  --   --  13 10 10   CREATININE 1.1  --   --  0.99 1.00 0.87  CALCIUM 9.9  --    --  10.0 8.8 8.5  MG  --  2.1  --   --   --   --   PHOS  --  3.8  --   --   --   --   < > = values in this interval not displayed. GFR Estimated Creatinine Clearance: 107.1 ml/min (by C-G formula based on Cr of 0.87). Liver Function Tests:  Recent Labs Lab 11/03/13 1230 11/03/13 1805  AST 19 20  ALT 14 17  ALKPHOS 60 56  BILITOT 1.75* 1.5*  PROT 8.2 8.0  ALBUMIN 4.2 4.0   CBC:  Recent Labs Lab 11/03/13 1230 11/03/13 1805 11/04/13 0410 11/06/13 0444  WBC 6.7 7.6 4.6 4.1  NEUTROABS 5.3 5.5  --   --   HGB 9.4* 8.9* 7.9* 7.4*  HCT 28.0* 26.3* 23.5* 21.3*  MCV 95.2 96.0 95.1 93.0  PLT 57* 66* 44* 36*   Microbiology Recent Results (from the past 240 hour(s))  CULTURE, BLOOD (ROUTINE X 2)     Status: None   Collection Time    11/03/13  6:05 PM      Result Value Ref Range Status   Specimen Description BLOOD RIGHT ARM   Final   Special Requests BOTTLES DRAWN AEROBIC AND ANAEROBIC 5ML   Final   Culture  Setup Time     Final   Value: 11/03/2013 21:56     Performed at [facility]   Culture     Final   Value:        BLOOD CULTURE RECEIVED NO GROWTH TO DATE CULTURE WILL BE HELD FOR 5 DAYS BEFORE ISSUING A FINAL NEGATIVE REPORT     Performed at [facility]   Report Status PENDING   Incomplete  URINE CULTURE     Status: None   Collection Time    11/03/13  6:14 PM      Result Value Ref Range Status   Specimen Description URINE, CLEAN CATCH   Final   Special Requests NONE   Final   Culture  Setup Time     Final   Value: 11/03/2013 22:35     Performed at [facility]  Final   Value: NO GROWTH     Performed at [facility]   Culture     Final   Value: NO GROWTH     Performed at [facility]   Report Status 11/04/2013 FINAL   Final  CULTURE, BLOOD (ROUTINE X 2)     Status: None   Collection Time    11/03/13  6:15 PM      Result Value Ref Range Status   Specimen Description BLOOD RIGHT HAND   Final   Special  Requests BOTTLES DRAWN AEROBIC ONLY 3ML   Final   Culture  Setup Time     Final   Value: 11/03/2013 21:55     Performed at [facility]   Culture     Final   Value:        BLOOD CULTURE RECEIVED NO GROWTH TO DATE CULTURE WILL BE HELD FOR 5 DAYS BEFORE ISSUING A FINAL NEGATIVE REPORT     Performed at [facility]   Report Status PENDING   Incomplete    Time coordinating discharge: 35 minutes.  SignedVenetia Maxon Hameed Kolar  Pager [phone] [facility] 11/07/2013, 10:20 AM

## 2013-11-08 ENCOUNTER — Ambulatory Visit (HOSPITAL_BASED_OUTPATIENT_CLINIC_OR_DEPARTMENT_OTHER): Payer: BC Managed Care – PPO | Admitting: Oncology

## 2013-11-08 ENCOUNTER — Telehealth: Payer: Self-pay | Admitting: Oncology

## 2013-11-08 ENCOUNTER — Telehealth: Payer: Self-pay | Admitting: *Deleted

## 2013-11-08 ENCOUNTER — Other Ambulatory Visit (HOSPITAL_BASED_OUTPATIENT_CLINIC_OR_DEPARTMENT_OTHER): Payer: BC Managed Care – PPO

## 2013-11-08 ENCOUNTER — Other Ambulatory Visit: Payer: Self-pay | Admitting: Dermatology

## 2013-11-08 ENCOUNTER — Ambulatory Visit: Payer: BC Managed Care – PPO

## 2013-11-08 VITALS — BP 161/83 | HR 89 | Temp 97.9°F | Resp 18 | Ht 71.0 in | Wt 205.0 lb

## 2013-11-08 DIAGNOSIS — D469 Myelodysplastic syndrome, unspecified: Secondary | ICD-10-CM

## 2013-11-08 DIAGNOSIS — L539 Erythematous condition, unspecified: Secondary | ICD-10-CM

## 2013-11-08 DIAGNOSIS — J189 Pneumonia, unspecified organism: Secondary | ICD-10-CM

## 2013-11-08 DIAGNOSIS — C9000 Multiple myeloma not having achieved remission: Secondary | ICD-10-CM

## 2013-11-08 LAB — CBC WITH DIFFERENTIAL/PLATELET
BASO%: 0.6 % (ref 0.0–2.0)
Basophils Absolute: 0 10*3/uL (ref 0.0–0.1)
EOS ABS: 0.1 10*3/uL (ref 0.0–0.5)
EOS%: 2.1 % (ref 0.0–7.0)
HEMATOCRIT: 25.6 % — AB (ref 38.4–49.9)
HEMOGLOBIN: 8.4 g/dL — AB (ref 13.0–17.1)
LYMPH#: 0.7 10*3/uL — AB (ref 0.9–3.3)
LYMPH%: 13 % — ABNORMAL LOW (ref 14.0–49.0)
MCH: 30.7 pg (ref 27.2–33.4)
MCHC: 32.8 g/dL (ref 32.0–36.0)
MCV: 93.4 fL (ref 79.3–98.0)
MONO#: 0.5 10*3/uL (ref 0.1–0.9)
MONO%: 9.9 % (ref 0.0–14.0)
NEUT#: 3.9 10*3/uL (ref 1.5–6.5)
NEUT%: 74.4 % (ref 39.0–75.0)
Platelets: 47 10*3/uL — ABNORMAL LOW (ref 140–400)
RBC: 2.74 10*6/uL — ABNORMAL LOW (ref 4.20–5.82)
RDW: 17.7 % — ABNORMAL HIGH (ref 11.0–14.6)
WBC: 5.2 10*3/uL (ref 4.0–10.3)
nRBC: 1 % — ABNORMAL HIGH (ref 0–0)

## 2013-11-08 LAB — BASIC METABOLIC PANEL (CC13)
Anion Gap: 11 mEq/L (ref 3–11)
BUN: 8.7 mg/dL (ref 7.0–26.0)
CHLORIDE: 109 meq/L (ref 98–109)
CO2: 20 meq/L — AB (ref 22–29)
Calcium: 9.4 mg/dL (ref 8.4–10.4)
Creatinine: 0.9 mg/dL (ref 0.7–1.3)
GLUCOSE: 106 mg/dL (ref 70–140)
POTASSIUM: 3.9 meq/L (ref 3.5–5.1)
Sodium: 140 mEq/L (ref 136–145)

## 2013-11-08 LAB — TECHNOLOGIST REVIEW

## 2013-11-08 NOTE — Telephone Encounter (Signed)
Per staff message and POF I have scheduled appts.  JMW  

## 2013-11-08 NOTE — ED Provider Notes (Signed)
Medical screening examination/treatment/procedure(s) were performed by non-physician practitioner and as supervising physician I was immediately available for consultation/collaboration.   EKG Interpretation None        Ephraim Hamburger, MD 11/08/13 [phone]

## 2013-11-08 NOTE — Telephone Encounter (Signed)
Gave pt appt for lab and MD emailed michelle regarding chemo °

## 2013-11-08 NOTE — Telephone Encounter (Signed)
lvm for pt regarding to May appt....mailed pt appt sched avs and letter °

## 2013-11-08 NOTE — Progress Notes (Signed)
[name] OFFICE PROGRESS NOTE   Diagnosis: Multiple myeloma, myelodysplasia  INTERVAL HISTORY:   Chad [name] was discharged from the hospital yesterday after an admission with high fever. A CT revealed a left lung pneumonia. He was discharged on Bactrim and doxycycline.  He reports no fever since discharge from the hospital. He developed an erythematous tender area at the right lower leg beginning yesterday. No other similar lesions. He has a few small bruises at the left hand. No known insect bite or trauma to the right leg.  He has noted sinus congestion for the past few days.  Objective:  Vital signs in last 24 hours:  Blood pressure 161/83, pulse 89, temperature 97.9 F (36.6 C), temperature source Oral, resp. rate 18, height 5' 11"  (1.803 m), weight 205 lb (92.987 kg), SpO2 100.00%.    HEENT: No thrush or ulcers, no sinus tenderness, the neck is supple Resp: Inspiratory rhonchi at the right base, no respiratory distress Cardio: Regular rate and rhythm GI: No hepatosplenomegaly, nontender Vascular: No leg edema  Skin: At the low right pretibial area there is a 1 cm erythematous lesion without a central clearing. The lesion is slightly raised/indurated with 3-4 cm of faint surrounding erythema     Lab Results:  Lab Results  Component Value Date   WBC 5.2 11/08/2013   HGB 8.4* 11/08/2013   HCT 25.6* 11/08/2013   MCV 93.4 11/08/2013   PLT 47* 11/08/2013   NEUTROABS 3.9 11/08/2013    Medications: I have reviewed the patient's current medications.  Assessment/Plan: 1. Multiple myeloma-status post high-dose chemotherapy with autologous stem cell support in November of 2004. The sero monoclonal protein was slowly rising and a restaging bone marrow biopsy in November 2009 confirmed 10 to 20% plasma cells. The serum light chains were elevated 07/25/2008. He completed 12 cycles of salvage therapy with Revlimid/Decadron with the last cycle initiated 06/16/2009. On  07/13/2009 the serum free light chains were normal and a serum protein electrophoresis/immunofixation revealed no monoclonal protein. Elevated serum M spike and serum free kappa light chains March 2013. He began cycle 1 of Revlimid 25 mg daily x21 days followed by a 7 day break on 09/27/2011. He began a second cycle of salvage Revlimid beginning on 10/25/2011. The serum M spike was stable and the serum free kappa light chains were slightly more elevated on 11/19/2011. He began another cycle of Revlimid/Decadron on 11/22/2011. Velcade was added beginning on 11/29/2011. He completed 3 cycles of Revlimid/Velcade/Decadron with improvement in the serum M spike/serum free kappa light chains. The last cycle was initiated on 01/20/2012. He received melphalan 200 mg per meter squared on 03/17/2012 with autologous stem cell support on 03/18/2012. Serum immunofixation confirmed a monoclonal IgG kappa protein, too low to quantify and a mild elevation of the serum free kappa light chains on 05/19/2012. He began maintenance Revlimid on 07/11/2012. Serum M spike 0.25, serum free kappa light chains 3.04 on 08/07/2012. Revlimid was placed on hold during the 09/16/2012 hospital admission. Serum M spike 0.45 and serum free kappa light chains 1.83 on 10/21/2012. Revlimid was resumed 11/04/2012. Revlimid was placed on hold 09/07/2013 secondary to progressive anemia 2. A serum immunofixation confirmed a monoclonal protein and the serum Kappa and Lambda light chains were mildly elevated in June of 2012  3. History of elevated liver enzymes. 4. Right lung pneumonia June, 2009 -- resolved after treatment with Bactrim and azithromycin. 5. 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 He continues to have intermittent numbness in the feet. 6. History of hypokalemia -he is no longer taking a potassium supplement 7. Pneumonia 01/06/2009 -- requiring hospitalization 01/10/2011 -- status  post broad spectrum antibiotic therapy with clinical resolution. 8. Upper respiratory infection in February, 2011 -- likely a viral URI.  9. Right shoulder/upper arm pain. Plain x-ray of the right shoulder/humerus on 10/16/2011 showed 2 small lytic lesions at the proximal humeral shaft suspicious for myeloma involvement. MRI of the right shoulder 10/27/2011 confirmed multiple myelomatous lesions involving the proximal right humerus, scapula, and ribs. A large destructive lesion was noted in the scapular body with an associated soft tissue component. He completed palliative radiation to the right scapula on 11/19/2011 with partial improvement in the shoulder discomfort. 10. "Hemorrhoid" discomfort and urinary hesitancy on 11/15/2011. He appeared to have an ulcer or fissure at the anal verge. Symptoms have resolved. 11. Fever/sepsis/bacteremia/question pneumonia September 2013 following stem cell therapy. 12. Admission with pneumonia 09/16/2012-influenza A was confirmed on a bronchoscopy03/04/2013, he was treated with Tamiflu. 13. Abundant atypical squamous cells on the bronchial washing cytology from 09/21/2012-most likely secondary to reactive changes, no clinical history to suggest a diagnosis of non-small cell lung cancer. 15. Pain right upper arm. Plain x-ray 11/04/2012 with a 6 mm lytic lesion in the proximal humeral shaft. No fracture or dislocation. Improved following steroid injections and physical therapy.probable adhesive capsulitis-resolved  16. Cystic neck mass-status post incision and drainage 06/16/2013 followed by doxycycline  17. 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 He is undergoing a transplant evaluation at [facility]. 18. Admission 11/03/2013 with a high fever, left lung pneumonia confirmed on a CT of the chest, the fever improved on vancomycin/aztreoman,  cultures of the blood and urine were negative 19. Erythematous slightly nodular skin lesion at the right lower leg 11/08/2013    Disposition:  He is afebrile. He appears to be recovering from the admission with a high fever and x-ray evidence of left lung pneumonia. Mr. Merwin did not have symptoms of pneumonia.  He has developed an erythematous lesion at the right lower leg. I discussed this finding with the infectious disease service. They recommend a dermatology referral for a skin biopsy with the tissue to be sent for routine histology, fungal/AFB stains and cultures, and routine cultures. I suspect this lesion is related to an infectious process as opposed to a plasmacytoma or a drug reaction.  Mr. Chasteen will return for an office visit and Zometa 11/15/2013. He will contact us in the interim for recurrent fever or progression of the skin lesion.  I discussed the case with the bone marrow transplant service at [facility]. We will check immunoglobulin levels and consider prophylactic IVIG therapy given his history of recurrent bacterial infections.  Ladell Pier, MD  11/08/2013  9:13 AM

## 2013-11-09 LAB — PROTEIN ELECTROPHORESIS, SERUM
ALPHA-1-GLOBULIN: 6.2 % — AB (ref 2.9–4.9)
Albumin ELP: 52.3 % — ABNORMAL LOW (ref 55.8–66.1)
Alpha-2-Globulin: 12.8 % — ABNORMAL HIGH (ref 7.1–11.8)
Beta 2: 15.6 % — ABNORMAL HIGH (ref 3.2–6.5)
Beta Globulin: 5 % (ref 4.7–7.2)
Gamma Globulin: 8.1 % — ABNORMAL LOW (ref 11.1–18.8)
Total Protein, Serum Electrophoresis: 7.8 g/dL (ref 6.0–8.3)

## 2013-11-09 LAB — CULTURE, BLOOD (ROUTINE X 2)
Culture: NO GROWTH
Culture: NO GROWTH

## 2013-11-09 LAB — KAPPA/LAMBDA LIGHT CHAINS
KAPPA FREE LGHT CHN: 8.3 mg/dL — AB (ref 0.33–1.94)
Kappa:Lambda Ratio: 4.3 — ABNORMAL HIGH (ref 0.26–1.65)
Lambda Free Lght Chn: 1.93 mg/dL (ref 0.57–2.63)

## 2013-11-10 ENCOUNTER — Telehealth: Payer: Self-pay | Admitting: Oncology

## 2013-11-10 NOTE — Telephone Encounter (Signed)
Talked to pt and gave him appt for Zometa, he is aware that Zometa is around noon and ML visit is 1045am

## 2013-11-11 ENCOUNTER — Ambulatory Visit (INDEPENDENT_AMBULATORY_CARE_PROVIDER_SITE_OTHER)
Admission: RE | Admit: 2013-11-11 | Discharge: 2013-11-11 | Disposition: A | Discharge status: Home / Self Care | Payer: BC Managed Care – PPO | Source: Ambulatory Visit | Attending: Adult Health | Admitting: Adult Health

## 2013-11-11 ENCOUNTER — Other Ambulatory Visit (INDEPENDENT_AMBULATORY_CARE_PROVIDER_SITE_OTHER): Payer: BC Managed Care – PPO

## 2013-11-11 ENCOUNTER — Ambulatory Visit (INDEPENDENT_AMBULATORY_CARE_PROVIDER_SITE_OTHER): Payer: BC Managed Care – PPO | Admitting: Adult Health

## 2013-11-11 ENCOUNTER — Encounter: Payer: Self-pay | Admitting: Adult Health

## 2013-11-11 VITALS — BP 132/84 | HR 88 | Temp 98.1°F | Ht 71.5 in | Wt 202.0 lb

## 2013-11-11 DIAGNOSIS — L989 Disorder of the skin and subcutaneous tissue, unspecified: Secondary | ICD-10-CM

## 2013-11-11 DIAGNOSIS — Q068 Other specified congenital malformations of spinal cord: Secondary | ICD-10-CM

## 2013-11-11 DIAGNOSIS — J189 Pneumonia, unspecified organism: Secondary | ICD-10-CM

## 2013-11-11 DIAGNOSIS — D469 Myelodysplastic syndrome, unspecified: Principal | ICD-10-CM

## 2013-11-11 DIAGNOSIS — IMO0001 Reserved for inherently not codable concepts without codable children: Secondary | ICD-10-CM

## 2013-11-11 LAB — CBC WITH DIFFERENTIAL/PLATELET
Basophils Absolute: 0 10*3/uL (ref 0.0–0.1)
Basophils Relative: 0.4 % (ref 0.0–3.0)
Eosinophils Absolute: 0.1 10*3/uL (ref 0.0–0.7)
Eosinophils Relative: 0.4 % (ref 0.0–5.0)
Hemoglobin: 9.5 g/dL — ABNORMAL LOW (ref 13.0–17.0)
Lymphocytes Relative: 13.6 % (ref 12.0–46.0)
Lymphs Abs: 1.6 10*3/uL (ref 0.7–4.0)
MCHC: 34 g/dL (ref 30.0–36.0)
MCV: 93.6 fl (ref 78.0–100.0)
MONOS PCT: 6.8 % (ref 3.0–12.0)
Monocytes Absolute: 0.8 10*3/uL (ref 0.1–1.0)
NEUTROS PCT: 78.8 % — AB (ref 43.0–77.0)
Neutro Abs: 9.4 10*3/uL — ABNORMAL HIGH (ref 1.4–7.7)
RBC: 2.99 Mil/uL — ABNORMAL LOW (ref 4.22–5.81)
RDW: 19.6 % — ABNORMAL HIGH (ref 11.5–14.6)
WBC: 11.9 10*3/uL — AB (ref 4.5–10.5)

## 2013-11-11 LAB — BASIC METABOLIC PANEL
BUN: 10 mg/dL (ref 6–23)
CO2: 22 meq/L (ref 19–32)
Calcium: 9.1 mg/dL (ref 8.4–10.5)
Chloride: 105 mEq/L (ref 96–112)
Creatinine, Ser: 1 mg/dL (ref 0.4–1.5)
GFR: 79.4 mL/min (ref >60.00)
Glucose, Bld: 93 mg/dL (ref 70–99)
POTASSIUM: 3.6 meq/L (ref 3.5–5.1)
SODIUM: 136 meq/L (ref 135–145)

## 2013-11-11 LAB — HEPATIC FUNCTION PANEL
ALT: 24 U/L (ref 0–53)
AST: 20 U/L (ref 0–37)
Albumin: 3.9 g/dL (ref 3.5–5.2)
Alkaline Phosphatase: 72 U/L (ref 39–117)
BILIRUBIN DIRECT: 0.2 mg/dL (ref 0.0–0.3)
BILIRUBIN TOTAL: 0.8 mg/dL (ref 0.3–1.2)
Total Protein: 8.4 g/dL — ABNORMAL HIGH (ref 6.0–8.3)

## 2013-11-11 LAB — SEDIMENTATION RATE: Sed Rate: 104 mm/hr — ABNORMAL HIGH (ref 0–22)

## 2013-11-11 LAB — BRAIN NATRIURETIC PEPTIDE: Pro B Natriuretic peptide (BNP): 18 pg/mL (ref 0.0–100.0)

## 2013-11-11 NOTE — Patient Instructions (Signed)
Labs today  Legs elevated Low salt diet  Tylenol as needed pain.  I will call with lab results.  Please contact office for sooner follow up if symptoms do not improve or worsen or seek emergency care

## 2013-11-11 NOTE — Progress Notes (Signed)
Quick Note:  LMOM TCB x1. ______ 

## 2013-11-11 NOTE — Progress Notes (Signed)
Subjective:    Patient ID: Chad [name], male    DOB: Oct 17, 1955, 58 y.o.   MRN: [id number]  HPI 58 y/o WM, husb of Chad [name], ...  ~  December 17, 2010:  68yrROV & JFara Oldenfeels that he is doing well> [facility] Heme [facility] from his Mult Myeloma after his salvage therapy w/ Revlilid & Decadron in 2010... He denies cough, sputum, CP, palpit, SOB/ DOE, edema, etc...  He ran out of his BP meds one month ago & his BP has been 130s/ 80s at home & he wants to try off meds for now;  Similarly he quit his Vytorin about a yr ago & trying "diet alone" but wt up to 233# & he has a long way to go w/ diet & exercise for this prob... F/u FLP w/ incr TG & LDL but he doesn't want meds yet & wants to try better diet & exercise program...  We reviewed recent labs from [facility] complimented those w/ FASTING labs at this time>> see below...  ~  July 31, 2012:  89moOV & JoFara Oldenas had an eventful interval> diagnosed w/ recurrent Myeloma 3/13 & treated w/ Revlimid, Decadron, Velcade chemoRx & [facility]; he had a difficult post-Tx hosp w/ sepsis & was in the ICU but eventually recovered & now on maintenance Revlimid & Zometa from DrSherrill...  We reviewed the following medical problems during today's office visit >>      Hx Pneumonia> bouts in 2009 & 2010, resolved w/ antibiotics, baseline CXR w/ bibasilar atx & scarring    HBP> on diet alone; BP= 140/90 but better at home & DrSherrill's office ave 130s/80s; he denies CP, palpit, dizzy, SOB, edema...    CAD> on ASA81; known non-obstructive CAD (cath 2003) & he had [facility]read[facility], etc[facility] & pt reports that they were OK...    CHOL> on diet alone & labs are OK w/ FLP showing TChol 185, TG 116, HDL 58, LDL 104    Overweight> his peak wt was ~250# in 2007; states he lost down to  ~200# in Sep2013 w/ stem cell tx, now back to 218# & asked to diet, exercise, keep weight down...    GI- colon polyps> last colonoscopy was 2008 w/ adenomatous polyp removed & f/u planned 5y26yrut holding off due to the recent MM recurrence & stem cell Tx...     Mult Myeloma> on Valtrex500/d for 85yr49yr shingles prophylaxis after stem cell tx; on Maintenance Revlimid, Zometa from DrSherrill...    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    Hx urticaria & angioedema> Hx ACE cough, rash from Imipenum, then urticaria&angioedema from ?Avelox vs Diovan...   We reviewed prob list, meds, xrays and labs> see below for updates >> unable to get flu shot due to stem cell tx & chemoRx... CXR 1/14 showed normal heart size, clear lungs, NAD... LABS 1/14 showed FLP- at goals on diet w/ LDL=104;  Chems- wnl w/ BS=101;  CBC- wnl;  TSH=1.18;  PSA=0.50 . 11/11/2013 Acute OV  Presents for an acute office visit.  Complains of  lesions on lower legs and left arm and LE edema, red, warm to  touch, tender to touch.   Appeared during recent hospitalization last week for PNA.  Was seen by Dermatology on 4/27. Lesion was biopsied on right leg.  Says during hospital stay noticed a tender red warm nodule along right shin. Over last 5 days has had several appear along forearms and legs . Tender to touch. No dysphagia, lip swelling or wheezing or cough.   Pt was admitted 11/03/13 for high fever and found to have a HC-PNA. Tx w/ aggressive abx including Vancomycin and Aztreonam . CT chest showed lingular opacity c/w PNA. Discharged on Bactrim and Doxycycline. BC were neg.  Revlimid was held .  Pt says he is feeling some better but still very weak. Fevers are resolving , minimal low grade at time.   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 ~  CXR 2/11 showed bibasilar linear scarring & atx, no pneumonia etc...   HYPERTENSION (ICD-401.9) - prev controlled on HCTZ 104m/d & K20/d, w/  BP in 2010 = 120/84... He tells me he ran out of his meds about 1 mo ago & BP today= 140/84 & he's been getting 130/80 at home since he ran out... denies HA, fatigue, visual changes, CP, palipit, dizziness, syncope, dyspnea, edema, etc... He wants to try off the med going forward & will monitor his BP carefully... ~  1/14: on diet alone; BP= 140/90 but better at home & DrSherrill's office ave 130s/80s; he denies CP, palpit, dizzy, SOB, edema   CORONARY ARTERY DISEASE (ICD-414.00) - on ASA 846md... adm w/ CP in 2003 w/ cath showing non-obstructive CAD- 40% lesion in sm ramus branch off the LAD, 30-40% midCIRC, otherw neg, EF= 65%... med rx w/ ASA 8156m + secondary risk factor reduction strategy... ~  NuclearStressTest 3/00 was neg- no ischmia, no infarct, EF= 69%... ~  6/12:  I reminded pt that secondary risk factor reduction strategy includes BP & Chol prominently! ~  1/14:  [facility]...  HYPERCHOLESTEROLEMIA (ICD-272.0) - prev on VYTORIN 10-20 daily, he stopped it on his own some time in 2011 for no particular reason> ~  FLPFranklin06 on Vytor10-20 showed TChol 161, TG 66, HDL 44, LDL 104. ~  FLP 3/08 on Vytor10-20 showed TChol 125, TG 66, HDL 46, LDL 66 ~  FLP 4/10 on Vytor10-20 showed TChol 149, TG 79, HDL 64, LDL 69 ~  FLP 6/12 off med for 43yr52yrwed TChol 192, TG 207, HDL 58, LDL 129... He doesn't want to go back on meds, therefore needs much better diet! ~  FLP 1/14 on diet alone showed TChol 185, TG 116, HDL 58, LDL 104  OVERWEIGHT (ICD-278.02) - we discussed diet + exercise recommendations... ~  peak weight = 248# in 2007... ~  weight 10/08 = 244# ~  weight 3/10 = 226# ~  Weight 6/12 = 233#... We reviewed diet +  exercise program required for wt reduction. ~  Weight 1/14 = 218#... He reports down to 2003 after stem cell tx 9/13.  COLONIC POLYPS (ICD-211.3) - last colonoscopy 10/08 by DrDBrodie showed 6mm 62mc colon polyp & grI hems... path= adenomatous w/ f/u planned for 67yrs.26yrMULTIPLE  MYELOMA (ICD-203.00) - Diagnosed w/ IgG Myeloma in 2003: he had  DVD chemoRx in [place] then referred to [facility] for high dose chemotherapy w/ autologous stem cell support done 10/04... since then he's been followed regularly at [facility] and by DrSherrill (on Zometa thru 2007)... his monoclonal protein had been slowly rising & restaging bone marrow bx 11/09 showed 10-20% plasma cells... started on Revlimid (Thalidomide)/ weekly Decadron Jan10 for 12 cycles thru Dec10 at which time his SPE/ IEP showed no monoclonal protein... Serial labs showed return of Myeloma markers 3/13 w/ Revlimid, Decadron, Velcade chemotherapy restarted; he had 10XRT treatments for Bone lesion in right humerus/ scapula/ ribs;  then referred back to [facility] for another autologous stem cell transplant 9/13;  He had a stormy hosp course w/ sepsis & was quite ill in ICU etc; eventually recovered & disch w/ remission of his myeloma- now on maintenance Revlimid (& Zometa Q18mo per DrSherrill... ~  [facility] of his [facility]... ~  [facility] (this will be 9/14)...  Hx of URTICARIA (ICD-708.9) & Hx of ANGIOEDEMA (ICD-995.1) - he developed angioedema of the lips and hives all over after [facility]. he was also on Diovan at the time and this was felt to be the more likely culprit... hx of ACE cough in the past & Imipenim rash yrs ago...   Past Surgical History  Procedure Laterality Date  . 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 Video bronchoscopy Bilateral 09/21/2012    Procedure: VIDEO  BRONCHOSCOPY WITHOUT FLUORO;  Surgeon: DaWilhelmina McardleMD;  Location: [facility];  Service: Cardiopulmonary;  Laterality: Bilateral;     Outpatient Encounter Prescriptions as of 11/11/2013  Medication Sig  . acetaminophen (TYLENOL) 500 MG tablet Per bottle as needed for pain  . Calcium 600-200 MG-UNIT per tablet Take 1 tablet by mouth 2 (two) times daily.  . Marland Kitchenoxycycline (VIBRAMYCIN) 50 MG capsule Take 1 capsule (50 mg total) by mouth 2 (two) times daily.  . Marland Kitchenulfamethoxazole-trimethoprim (BACTRIM DS,SEPTRA DS) 800-160 MG per tablet Take 1 tablet by mouth 2 (two) times daily.  . Zoledronic Acid (ZOMETA IV) Inject 4 mg into the vein every 3 (three) months.     Allergies  Allergen Reactions  . Primaxin [Imipenem] Rash  . Cephalosporins     Reaction unknown  . Clarithromycin Other (See Comments)  . Lisinopril     REACTION: ALLERGIC to ACE w/ cough...  . Moxifloxacin     Swollen lips  . Penicillins     rash  . Valsartan     REACTION: ALLERGIC to ARB w/ Urticaria \T\ Angioedema    Current Medications, Allergies, Past Medical History, Past Surgical History, Family History, and Social History were reviewed in [employer].   Review of Systems       See HPI - all other systems neg except as noted... The patient denies anorexia, fever, weight loss, weight gain, vision loss, decreased hearing, hoarseness, chest pain, syncope, dyspnea on exertion, peripheral edema, prolonged cough, headaches, hemoptysis, abdominal pain, melena, hematochezia, severe indigestion/heartburn, hematuria, incontinence, muscle weakness  transient blindness, difficulty walking, depression, unusual weight change, abnormal bleeding, enlarged lymph nodes, and angioedema.    Objective:   Physical Exam     WD, Overweight, [age]/o WM in NAD... GENERAL:  Alert & oriented; pleasant & cooperative. HEENT:  [place]/AT, EOM-wnl, PERRLA, Fundi-benign, EACs-clear, TMs-wnl, NOSE-clear, THROAT-clear &  wnl. NECK:  Supple w/ fairROM; no JVD; normal carotid impulses w/o bruits; no thyromegaly or nodules palpated; no lymphadenopathy. CHEST:  Clear to P & A; without wheezes/ rales/ or rhonchi. HEART:  Regular Rhythm; without murmurs/ rubs/ or gallops. ABDOMEN:  Soft & nontender; normal bowel sounds; no organomegaly or masses detected. EXT: without deformities or arthritic changes; no varicose veins/ venous insuffic/ or edema. NEURO:    gait normal & balance OK. DERM:  Along right shin with erythematous area s/p bx w/ sutures in place Scattered along shins, lower extremities and forearms are several similar cutaneous lesions that are warm and erythematous.

## 2013-11-12 ENCOUNTER — Encounter: Payer: Self-pay | Admitting: Adult Health

## 2013-11-12 DIAGNOSIS — L989 Disorder of the skin and subcutaneous tissue, unspecified: Secondary | ICD-10-CM | POA: Insufficient documentation

## 2013-11-12 NOTE — Assessment & Plan Note (Signed)
Left lingular PNA -clinically improving   Plan  Check cxr today  Finish abx

## 2013-11-12 NOTE — Assessment & Plan Note (Addendum)
Multiple erythematous cutaneous nodular skin lesions ? Etiology  Bx pending from 11/08/13-from Dermatology  ?Erythema Nodosum vs medication side effect /sulfa  Check labs w/ ESR  Hold off on steroids given his underlying immunosuppression state  Advised on pain control

## 2013-11-12 NOTE — Progress Notes (Signed)
Quick Note:  Called and spoke to pt regarding results. Pt verbalized understanding and denied any further questions or concerns at this time. ______ 

## 2013-11-15 ENCOUNTER — Telehealth: Payer: Self-pay | Admitting: Oncology

## 2013-11-15 ENCOUNTER — Ambulatory Visit (HOSPITAL_BASED_OUTPATIENT_CLINIC_OR_DEPARTMENT_OTHER): Payer: BC Managed Care – PPO

## 2013-11-15 ENCOUNTER — Ambulatory Visit (HOSPITAL_BASED_OUTPATIENT_CLINIC_OR_DEPARTMENT_OTHER): Payer: BC Managed Care – PPO | Admitting: Nurse Practitioner

## 2013-11-15 ENCOUNTER — Other Ambulatory Visit (HOSPITAL_BASED_OUTPATIENT_CLINIC_OR_DEPARTMENT_OTHER): Payer: BC Managed Care – PPO

## 2013-11-15 VITALS — BP 150/77 | HR 87 | Temp 98.2°F | Resp 18 | Ht 71.0 in | Wt 198.5 lb

## 2013-11-15 DIAGNOSIS — L989 Disorder of the skin and subcutaneous tissue, unspecified: Secondary | ICD-10-CM

## 2013-11-15 DIAGNOSIS — IMO0001 Reserved for inherently not codable concepts without codable children: Secondary | ICD-10-CM

## 2013-11-15 DIAGNOSIS — C9000 Multiple myeloma not having achieved remission: Secondary | ICD-10-CM

## 2013-11-15 DIAGNOSIS — Q068 Other specified congenital malformations of spinal cord: Secondary | ICD-10-CM

## 2013-11-15 DIAGNOSIS — D469 Myelodysplastic syndrome, unspecified: Secondary | ICD-10-CM

## 2013-11-15 DIAGNOSIS — M25519 Pain in unspecified shoulder: Secondary | ICD-10-CM

## 2013-11-15 LAB — CBC WITH DIFFERENTIAL/PLATELET
BASO%: 0.2 % (ref 0.0–2.0)
Basophils Absolute: 0 10*3/uL (ref 0.0–0.1)
EOS ABS: 0.1 10*3/uL (ref 0.0–0.5)
EOS%: 0.8 % (ref 0.0–7.0)
HEMATOCRIT: 26.4 % — AB (ref 38.4–49.9)
HGB: 8.6 g/dL — ABNORMAL LOW (ref 13.0–17.1)
LYMPH%: 12.3 % — AB (ref 14.0–49.0)
MCH: 30.4 pg (ref 27.2–33.4)
MCHC: 32.6 g/dL (ref 32.0–36.0)
MCV: 93.3 fL (ref 79.3–98.0)
MONO#: 0.8 10*3/uL (ref 0.1–0.9)
MONO%: 7.5 % (ref 0.0–14.0)
NEUT#: 8.3 10*3/uL — ABNORMAL HIGH (ref 1.5–6.5)
NEUT%: 79.2 % — AB (ref 39.0–75.0)
NRBC: 2 % — AB (ref 0–0)
PLATELETS: 116 10*3/uL — AB (ref 140–400)
RBC: 2.83 10*6/uL — ABNORMAL LOW (ref 4.20–5.82)
RDW: 18 % — ABNORMAL HIGH (ref 11.0–14.6)
WBC: 10.5 10*3/uL — ABNORMAL HIGH (ref 4.0–10.3)
lymph#: 1.3 10*3/uL (ref 0.9–3.3)

## 2013-11-15 LAB — TECHNOLOGIST REVIEW

## 2013-11-15 MED ORDER — ZOLEDRONIC ACID 4 MG/100ML IV SOLN
4.0000 mg | Freq: Once | INTRAVENOUS | Status: AC
  Administered 2013-11-15: 4 mg via INTRAVENOUS
  Filled 2013-11-15: qty 100

## 2013-11-15 MED ORDER — HYDROCODONE-ACETAMINOPHEN 5-325 MG PO TABS: 1.0000 | ORAL_TABLET | Freq: Three times a day (TID) | ORAL | Status: DC | PRN

## 2013-11-15 NOTE — Telephone Encounter (Signed)
gve the pt his may 2015 appt calendar °

## 2013-11-15 NOTE — Patient Instructions (Signed)

## 2013-11-15 NOTE — Progress Notes (Addendum)
[facility] OFFICE PROGRESS NOTE   Diagnosis:  Multiple myeloma and myelodysplasia.  INTERVAL HISTORY:   Chad [name] returns for scheduled followup. He has developed multiple similar erythematous tender lesions on the extremities. He is experiencing significant pain involving a lesion at the right second MCP. No lesions on the trunk. He denies fever. No shortness of breath. Over the past 3 days he has noted decreased range of motion and some discomfort at the right shoulder/upper arm.  Objective:  Vital signs in last 24 hours:  Blood pressure 150/77, pulse 87, temperature 98.2 F (36.8 C), temperature source Oral, resp. rate 18, height 5' 11"  (1.803 m), weight 198 lb 8 oz (90.039 kg), SpO2 100.00%.    HEENT: No thrush or ulcerations. Resp: Faint rales right lung base. No respiratory distress. Cardio: Regular cardiac rhythm. GI: Abdomen soft and nontender. No organomegaly. Vascular: No leg edema.  Skin: Status post biopsy right lower medial leg. The surrounding skin has purplish discoloration. Inferior to the biopsy site there is a similar area of discoloration. At the base of the right pointer finger there is a 1 cm raised, tender erythematous lesion with central dark discoloration and surrounding erythema. The MCP joint is erythematous, edematous and tender. Small area of purplish discoloration at the left lower thigh region. 2 nodular areas with faint erythema inferior to the left elbow.   Lab Results:  Lab Results  Component Value Date   WBC 10.5* 11/15/2013   HGB 8.6* 11/15/2013   HCT 26.4* 11/15/2013   MCV 93.3 11/15/2013   PLT 116* 11/15/2013   NEUTROABS 8.3* 11/15/2013    Imaging:  No results found.  Medications: I have reviewed the patient's current medications.  Assessment/Plan: 1. Multiple myeloma-status post high-dose chemotherapy with autologous stem cell support in November of 2004. The sero monoclonal protein was slowly rising and a restaging bone marrow  biopsy in November 2009 confirmed 10 to 20% plasma cells. The serum light chains were elevated 07/25/2008. He completed 12 cycles of salvage therapy with Revlimid/Decadron with the last cycle initiated 06/16/2009. On 07/13/2009 the serum free light chains were normal and a serum protein electrophoresis/immunofixation revealed no monoclonal protein. Elevated serum M spike and serum free kappa light chains March 2013. He began cycle 1 of Revlimid 25 mg daily x21 days followed by a 7 day break on 09/27/2011. He began a second cycle of salvage Revlimid beginning on 10/25/2011. The serum M spike was stable and the serum free kappa light chains were slightly more elevated on 11/19/2011. He began another cycle of Revlimid/Decadron on 11/22/2011. Velcade was added beginning on 11/29/2011. He completed 3 cycles of Revlimid/Velcade/Decadron with improvement in the serum M spike/serum free kappa light chains. The last cycle was initiated on 01/20/2012. He received melphalan 200 mg per meter squared on 03/17/2012 with autologous stem cell support on 03/18/2012. Serum immunofixation confirmed a monoclonal IgG kappa protein, too low to quantify and a mild elevation of the serum free kappa light chains on 05/19/2012. He began maintenance Revlimid on 07/11/2012. Serum M spike 0.25, serum free kappa light chains 3.04 on 08/07/2012. Revlimid was placed on hold during the 09/16/2012 hospital admission. Serum M spike 0.45 and serum free kappa light chains 1.83 on 10/21/2012. Revlimid was resumed 11/04/2012. Revlimid was placed on hold 09/07/2013 secondary to progressive anemia 2. A serum immunofixation confirmed a monoclonal protein and the serum Kappa and Lambda light chains were mildly elevated in June of 2012  3. History of elevated liver enzymes. 4.  Right lung pneumonia June, 2009 -- resolved after treatment with Bactrim and azithromycin. 5. 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 He continues to have intermittent numbness in the feet. 6. History of hypokalemia -he is no longer taking a potassium supplement 7. Pneumonia 01/06/2009 -- requiring hospitalization 01/10/2011 -- status post broad spectrum antibiotic therapy with clinical resolution. 8. Upper respiratory infection in February, 2011 -- likely a viral URI.  9. Right shoulder/upper arm pain. Plain x-ray of the right shoulder/humerus on 10/16/2011 showed 2 small lytic lesions at the proximal humeral shaft suspicious for myeloma involvement. MRI of the right shoulder 10/27/2011 confirmed multiple myelomatous lesions involving the proximal right humerus, scapula, and ribs. A large destructive lesion was noted in the scapular body with an associated soft tissue component. He completed palliative radiation to the right scapula on 11/19/2011 with partial improvement in the shoulder discomfort. 10. "Hemorrhoid" discomfort and urinary hesitancy on 11/15/2011. He appeared to have an ulcer or fissure at the anal verge. Symptoms have resolved. 11. Fever/sepsis/bacteremia/question pneumonia September 2013 following stem cell therapy. 12. Admission with pneumonia 09/16/2012-influenza A was confirmed on a bronchoscopy03/04/2013, he was treated with Tamiflu. 13. Abundant atypical squamous cells on the bronchial washing cytology from 09/21/2012-most likely secondary to reactive changes, no clinical history to suggest a diagnosis of non-small cell lung cancer. 15. Pain right upper arm. Plain x-ray 11/04/2012 with a 6 mm lytic lesion in the proximal humeral shaft. No fracture or dislocation. Improved following steroid injections and physical therapy. Probable adhesive capsulitis. Symptoms resolved. Complaint of recurrent decreased range of motion and pain at the right shoulder/upper arm 11/15/2013. 16. Cystic neck mass-status post incision and drainage 06/16/2013 followed by doxycycline  17. 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 He is undergoing a transplant evaluation at [facility].  18. Admission 11/03/2013 with a high fever, left lung pneumonia confirmed on a CT of the chest, the fever improved on vancomycin/aztreoman, cultures of the blood and urine were negative  19. Erythematous slightly nodular skin lesion at the right lower leg 11/08/2013 status post biopsy 11/08/2013 with findings of dermal fibrosis and mixed inflammatory infiltrate. PAS-F stain negative. Tissue gram stain negative. Cultures pending. He subsequently developed multiple similar lesions. Question erythema nodosum.    Disposition: Chad [name] appears stable. We have contacted the dermatologist's office for culture results. He feels that overall the lesions are improving/resolving.  He is having significant pain associated with the lesion at the right first finger/joint. We will give him a prescription for Norco.  He understands to contact the office with progressive lesions or fever.  He will return for a followup visit in approximately 2 weeks.   Patient seen with Dr. Benay Spice.    Owens Shark ANP/GNP-BC   11/15/2013  11:40 AM  This was a shared visit with Ned Card. Chad [name] was interviewed and examined. He has developed multiple nodular erythematous skin lesions over the past week. Many of these lesions appear to be resolving. I discussed the case with Dr. Johnnye Sima. No infection has been identified to date. No fever.  He will remain off of antibiotics. If the painful induration at the right first MCP does not improve over the next few days we will prescribe a steroid Dosepak.  Julieanne Manson, M.D.

## 2013-11-18 ENCOUNTER — Telehealth: Payer: Self-pay | Admitting: *Deleted

## 2013-11-18 ENCOUNTER — Other Ambulatory Visit: Payer: Self-pay | Admitting: Dermatology

## 2013-11-18 NOTE — Telephone Encounter (Signed)
Lesion on hand worsened and was retaining fluid. Was seen by dermatologist today and it was drained and cultured and he did biopsy. Put him on cipro 500 mg bid and Doxycycline 100 mg bid for 10 days.

## 2013-11-22 ENCOUNTER — Telehealth: Payer: Self-pay | Admitting: *Deleted

## 2013-11-22 NOTE — Telephone Encounter (Signed)
Message from pt to make Dr. Benay Spice aware the dermatologist started him on Prednisone 60 mg for inflammation. Began Prednisone 11/18/13. Pt reports he had a biopsy of his hand, having some drainage but no pain.

## 2013-11-23 NOTE — Telephone Encounter (Signed)
Reviewed pt call with Dr. Benay Spice. Aware of steroid therapy. Pt scheduled to see Dr. Benay Spice 12/01/13.

## 2013-11-24 ENCOUNTER — Telehealth: Payer: Self-pay | Admitting: Dietician

## 2013-11-24 NOTE — Telephone Encounter (Signed)
Brief Outpatient Oncology Nutrition Note  Patient has been identified to be at risk on malnutrition screen.  Wt Readings from Last 10 Encounters:  11/15/13 198 lb 8 oz (90.039 kg)  11/11/13 202 lb (91.627 kg)  11/08/13 205 lb (92.987 kg)  11/04/13 203 lb 6.4 oz (92.262 kg)  08/09/13 205 lb 4.8 oz (93.123 kg)  06/24/13 204 lb 12.8 oz (92.897 kg)  06/21/13 207 lb (93.895 kg)  06/16/13 208 lb (94.348 kg)  06/16/13 207 lb 3.2 oz (93.985 kg)  05/03/13 205 lb 8 oz (93.214 kg)   Dx:  Multiple myeloma.  Patient of Dr. Benay Spice. Called patient due to weight loss.  Patient was not available. Left message with the Contact information for the [facility].   Antonieta Iba, RD, LDN

## 2013-12-01 ENCOUNTER — Other Ambulatory Visit (HOSPITAL_BASED_OUTPATIENT_CLINIC_OR_DEPARTMENT_OTHER): Payer: BC Managed Care – PPO

## 2013-12-01 ENCOUNTER — Telehealth: Payer: Self-pay | Admitting: Oncology

## 2013-12-01 ENCOUNTER — Ambulatory Visit (HOSPITAL_BASED_OUTPATIENT_CLINIC_OR_DEPARTMENT_OTHER): Payer: BC Managed Care – PPO | Admitting: Oncology

## 2013-12-01 ENCOUNTER — Telehealth: Payer: Self-pay | Admitting: *Deleted

## 2013-12-01 VITALS — BP 155/86 | HR 78 | Temp 98.5°F | Resp 18 | Ht 71.0 in | Wt 196.9 lb

## 2013-12-01 DIAGNOSIS — D469 Myelodysplastic syndrome, unspecified: Secondary | ICD-10-CM

## 2013-12-01 DIAGNOSIS — Q068 Other specified congenital malformations of spinal cord: Secondary | ICD-10-CM

## 2013-12-01 DIAGNOSIS — C9000 Multiple myeloma not having achieved remission: Secondary | ICD-10-CM

## 2013-12-01 DIAGNOSIS — IMO0001 Reserved for inherently not codable concepts without codable children: Secondary | ICD-10-CM

## 2013-12-01 LAB — CBC WITH DIFFERENTIAL/PLATELET
BASO%: 0.2 % (ref 0.0–2.0)
BASOS ABS: 0 10*3/uL (ref 0.0–0.1)
EOS%: 0 % (ref 0.0–7.0)
Eosinophils Absolute: 0 10*3/uL (ref 0.0–0.5)
HEMATOCRIT: 28.2 % — AB (ref 38.4–49.9)
HEMOGLOBIN: 9.3 g/dL — AB (ref 13.0–17.1)
LYMPH%: 11.2 % — AB (ref 14.0–49.0)
MCH: 30.2 pg (ref 27.2–33.4)
MCHC: 33 g/dL (ref 32.0–36.0)
MCV: 91.6 fL (ref 79.3–98.0)
MONO#: 0.9 10*3/uL (ref 0.1–0.9)
MONO%: 7.8 % (ref 0.0–14.0)
NEUT#: 9 10*3/uL — ABNORMAL HIGH (ref 1.5–6.5)
NEUT%: 80.8 % — AB (ref 39.0–75.0)
PLATELETS: 79 10*3/uL — AB (ref 140–400)
RBC: 3.08 10*6/uL — ABNORMAL LOW (ref 4.20–5.82)
RDW: 20.4 % — ABNORMAL HIGH (ref 11.0–14.6)
WBC: 11.1 10*3/uL — ABNORMAL HIGH (ref 4.0–10.3)
lymph#: 1.2 10*3/uL (ref 0.9–3.3)
nRBC: 10 % — ABNORMAL HIGH (ref 0–0)

## 2013-12-01 LAB — BASIC METABOLIC PANEL (CC13)
Anion Gap: 13 mEq/L — ABNORMAL HIGH (ref 3–11)
BUN: 14.9 mg/dL (ref 7.0–26.0)
CHLORIDE: 108 meq/L (ref 98–109)
CO2: 20 meq/L — AB (ref 22–29)
Calcium: 8.9 mg/dL (ref 8.4–10.4)
Creatinine: 0.9 mg/dL (ref 0.7–1.3)
Glucose: 111 mg/dl (ref 70–140)
POTASSIUM: 4 meq/L (ref 3.5–5.1)
Sodium: 140 mEq/L (ref 136–145)

## 2013-12-01 LAB — TECHNOLOGIST REVIEW

## 2013-12-01 NOTE — Progress Notes (Signed)
[facility] OFFICE PROGRESS NOTE   Diagnosis: Multiple myeloma, myelodysplasia  INTERVAL HISTORY:   [name] returns as scheduled. When we saw him 2 weeks ago he had developed increased erythema and induration overlying the right second MCP joint. He was seen by dermatology and reports the lesion had progressed. The lesion became fluid-filled. Aspiration and biopsy were performed by dermatology (we do not have the pathology or culture results available today). He was started on prednisone at a dose of 60 mg daily and this continues. He was also treated with a course of doxycycline and ciprofloxacin. All of the indurated skin lesions have improved including the lesion at the right hand. He denies fever. No complaint.  [name] reports allogeneic bone marrow donors have been identified. He continues to pretransplant process with [facility].  Objective:  Vital signs in last 24 hours:  Blood pressure 155/86, pulse 78, temperature 98.5 F (36.9 C), temperature source Oral, resp. rate 18, height 5' 11"  (1.803 m), weight 196 lb 14.4 oz (89.313 kg).    HEENT: No thrush Resp: Lungs clear bilaterally Cardio: Regular in rhythm GI: No hepatosplenomegaly Vascular: No leg edema  Skin: Hyperpigmentation and areas of previous indurated skin lesions. Erythema without fluctuance or tenderness at the dorsum of the right second MCP joint.   Portacath/PICC-without erythema  Lab Results:  Lab Results  Component Value Date   WBC 11.1* 12/01/2013   HGB 9.3* 12/01/2013   HCT 28.2* 12/01/2013   MCV 91.6 12/01/2013   PLT 79* 12/01/2013   NEUTROABS 9.0* 12/01/2013     Imaging:  No results found.  Medications: I have reviewed the patient's current medications.  Assessment/Plan: 1. Multiple myeloma-status post high-dose chemotherapy with autologous stem cell support in November of 2004. The sero monoclonal protein was slowly rising and a restaging bone marrow biopsy in November 2009 confirmed 10  to 20% plasma cells. The serum light chains were elevated 07/25/2008. He completed 12 cycles of salvage therapy with Revlimid/Decadron with the last cycle initiated 06/16/2009. On 07/13/2009 the serum free light chains were normal and a serum protein electrophoresis/immunofixation revealed no monoclonal protein. Elevated serum M spike and serum free kappa light chains March 2013. He began cycle 1 of Revlimid 25 mg daily x21 days followed by a 7 day break on 09/27/2011. He began a second cycle of salvage Revlimid beginning on 10/25/2011. The serum M spike was stable and the serum free kappa light chains were slightly more elevated on 11/19/2011. He began another cycle of Revlimid/Decadron on 11/22/2011. Velcade was added beginning on 11/29/2011. He completed 3 cycles of Revlimid/Velcade/Decadron with improvement in the serum M spike/serum free kappa light chains. The last cycle was initiated on 01/20/2012. He received melphalan 200 mg per meter squared on 03/17/2012 with autologous stem cell support on 03/18/2012. Serum immunofixation confirmed a monoclonal IgG kappa protein, too low to quantify and a mild elevation of the serum free kappa light chains on 05/19/2012. He began maintenance Revlimid on 07/11/2012. Serum M spike 0.25, serum free kappa light chains 3.04 on 08/07/2012. Revlimid was placed on hold during the 09/16/2012 hospital admission. Serum M spike 0.45 and serum free kappa light chains 1.83 on 10/21/2012. Revlimid was resumed 11/04/2012. Revlimid was placed on hold 09/07/2013 secondary to progressive anemia 2. A serum immunofixation confirmed a monoclonal protein and the serum Kappa and Lambda light chains were mildly elevated in June of 2012  3. History of elevated liver enzymes. 4. Right lung pneumonia June, 2009 -- resolved after  treatment with Bactrim and azithromycin. 5. 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 He continues to have  intermittent numbness in the feet. 6. History of hypokalemia -he is no longer taking a potassium supplement 7. Pneumonia 01/06/2009 -- requiring hospitalization 01/10/2011 -- status post broad spectrum antibiotic therapy with clinical resolution. 8. Upper respiratory infection in February, 2011 -- likely a viral URI.  9. Right shoulder/upper arm pain. Plain x-ray of the right shoulder/humerus on 10/16/2011 showed 2 small lytic lesions at the proximal humeral shaft suspicious for myeloma involvement. MRI of the right shoulder 10/27/2011 confirmed multiple myelomatous lesions involving the proximal right humerus, scapula, and ribs. A large destructive lesion was noted in the scapular body with an associated soft tissue component. He completed palliative radiation to the right scapula on 11/19/2011 with partial improvement in the shoulder discomfort. 10. "Hemorrhoid" discomfort and urinary hesitancy on 11/15/2011. He appeared to have an ulcer or fissure at the anal verge. Symptoms have resolved. 11. Fever/sepsis/bacteremia/question pneumonia September 2013 following stem cell therapy. 12. Admission with pneumonia 09/16/2012-influenza A was confirmed on a bronchoscopy03/04/2013, he was treated with Tamiflu. 13. Abundant atypical squamous cells on the bronchial washing cytology from 09/21/2012-most likely secondary to reactive changes, no clinical history to suggest a diagnosis of non-small cell lung cancer. 15. Pain right upper arm. Plain x-ray 11/04/2012 with a 6 mm lytic lesion in the proximal humeral shaft. No fracture or dislocation. Improved following steroid injections and physical therapy. Probable adhesive capsulitis. Symptoms resolved. Complaint of recurrent decreased range of motion and pain at the right shoulder/upper arm 11/15/2013.  16. Cystic neck mass-status post incision and drainage 06/16/2013 followed by doxycycline  17. 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 He is undergoing a transplant evaluation at [facility].  18. Admission 11/03/2013 with a high fever, left lung pneumonia confirmed on a CT of the chest, the fever improved on vancomycin/aztreoman, cultures of the blood and urine were negative  19. Erythematous slightly nodular skin lesion at the right lower leg 11/08/2013 status post biopsy 11/08/2013 with findings of dermal fibrosis and mixed inflammatory infiltrate. PAS-F stain negative. Tissue gram stain negative. Marland Kitchen He subsequently developed multiple similar lesions including a painful indurated/fluid-filled lesion at the dorsum of the right second MCP joint-status post a biopsy consistent with neutrophilic dermatosis, treated with ciprofloxacin/doxycycline and continued on prednisone    Disposition:  [name] appears stable from a hematologic standpoint. He continues the pre-transplant evaluation at [facility]. He will contact Dr. Elvera Lennox to discuss tapering of the prednisone since the skin lesions are much improved. It would be ideal for him to be off of prednisone in anticipation of the bone marrow transplant.  [name] will return for an office visit in 3 weeks.  Dr. Ok Edwards may consider repeat bone marrow biopsy to confirm the diagnosis of myelodysplasia prior to proceeding with the allogeneic transplant.  Ladell Pier, MD  12/01/2013  10:08 AM

## 2013-12-01 NOTE — Telephone Encounter (Signed)
Per Dr. Benay Spice; notified pt that Dr. Benay Spice spoke with Dr. Elvera Lennox and instructions given for pt to take Prednisone 40 mg for 4 days; then take 20 mg for 4 days; then stop.  Pt verbalized understanding of instructions and expressed appreciation for call.

## 2013-12-01 NOTE — Telephone Encounter (Signed)
gv adn printed appt sched and avs for pt for June °

## 2013-12-02 LAB — IGG, IGA, IGM
IgA: 158 mg/dL (ref 68–379)
IgG (Immunoglobin G), Serum: 1380 mg/dL (ref 650–1600)
IgM, Serum: 154 mg/dL (ref 41–251)

## 2013-12-07 ENCOUNTER — Ambulatory Visit (HOSPITAL_BASED_OUTPATIENT_CLINIC_OR_DEPARTMENT_OTHER): Payer: BC Managed Care – PPO

## 2013-12-07 ENCOUNTER — Ambulatory Visit (HOSPITAL_BASED_OUTPATIENT_CLINIC_OR_DEPARTMENT_OTHER): Payer: BC Managed Care – PPO | Admitting: Nurse Practitioner

## 2013-12-07 ENCOUNTER — Telehealth: Payer: Self-pay | Admitting: *Deleted

## 2013-12-07 ENCOUNTER — Ambulatory Visit (HOSPITAL_COMMUNITY)
Admission: RE | Admit: 2013-12-07 | Discharge: 2013-12-07 | Disposition: A | Discharge status: Home / Self Care | Payer: BC Managed Care – PPO | Source: Ambulatory Visit | Attending: Oncology | Admitting: Oncology

## 2013-12-07 ENCOUNTER — Telehealth: Payer: Self-pay | Admitting: Oncology

## 2013-12-07 VITALS — BP 151/80 | HR 119 | Temp 98.7°F | Resp 18 | Ht 71.0 in | Wt 196.7 lb

## 2013-12-07 DIAGNOSIS — C9000 Multiple myeloma not having achieved remission: Secondary | ICD-10-CM | POA: Insufficient documentation

## 2013-12-07 DIAGNOSIS — R000 Tachycardia, unspecified: Secondary | ICD-10-CM

## 2013-12-07 DIAGNOSIS — D469 Myelodysplastic syndrome, unspecified: Secondary | ICD-10-CM

## 2013-12-07 DIAGNOSIS — R609 Edema, unspecified: Secondary | ICD-10-CM

## 2013-12-07 DIAGNOSIS — D649 Anemia, unspecified: Secondary | ICD-10-CM

## 2013-12-07 DIAGNOSIS — D696 Thrombocytopenia, unspecified: Secondary | ICD-10-CM

## 2013-12-07 LAB — CBC WITH DIFFERENTIAL/PLATELET
BASO%: 0.1 % (ref 0.0–2.0)
Basophils Absolute: 0 10*3/uL (ref 0.0–0.1)
EOS ABS: 0 10*3/uL (ref 0.0–0.5)
EOS%: 0.1 % (ref 0.0–7.0)
HCT: 28 % — ABNORMAL LOW (ref 38.4–49.9)
HGB: 9.6 g/dL — ABNORMAL LOW (ref 13.0–17.1)
LYMPH%: 4.6 % — ABNORMAL LOW (ref 14.0–49.0)
MCH: 30.1 pg (ref 27.2–33.4)
MCHC: 34.3 g/dL (ref 32.0–36.0)
MCV: 87.8 fL (ref 79.3–98.0)
MONO#: 1.7 10*3/uL — ABNORMAL HIGH (ref 0.1–0.9)
MONO%: 10.7 % (ref 0.0–14.0)
NEUT%: 84.5 % — ABNORMAL HIGH (ref 39.0–75.0)
NEUTROS ABS: 13.2 10*3/uL — AB (ref 1.5–6.5)
Platelets: 66 10*3/uL — ABNORMAL LOW (ref 140–400)
RBC: 3.19 10*6/uL — AB (ref 4.20–5.82)
RDW: 22.3 % — AB (ref 11.0–14.6)
WBC: 15.6 10*3/uL — ABNORMAL HIGH (ref 4.0–10.3)
lymph#: 0.7 10*3/uL — ABNORMAL LOW (ref 0.9–3.3)
nRBC: 5 % — ABNORMAL HIGH (ref 0–0)

## 2013-12-07 LAB — TECHNOLOGIST REVIEW

## 2013-12-07 LAB — HOLD TUBE, BLOOD BANK

## 2013-12-07 MED ORDER — ALPRAZOLAM 0.25 MG PO TABS: 0.2500 mg | ORAL_TABLET | Freq: Two times a day (BID) | ORAL | Status: AC | PRN

## 2013-12-07 NOTE — Telephone Encounter (Signed)
Sent the pt over to the [facility] to check in for his venous dopplar of the left leg for today.

## 2013-12-07 NOTE — Progress Notes (Signed)
*  Preliminary Results* Left lower extremity venous duplex completed. Left lower extremity is negative for deep vein thrombosis. There is no evidence of left Baker's cyst.  Attempted to call report to [facility] office, however there was no answer. The patient will be discharged and if necessary can be reached by phone.  12/07/2013 4:54 PM  Maudry Mayhew, RVT, RDCS, RDMS
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
## 2013-12-07 NOTE — Telephone Encounter (Signed)
Per Dr. Benay Spice: Check CBC, work in to see Lattie Haw, NP. Same done. Called pt with appt.

## 2013-12-12 ENCOUNTER — Encounter (HOSPITAL_COMMUNITY): Payer: Self-pay | Admitting: Emergency Medicine

## 2013-12-12 ENCOUNTER — Emergency Department (HOSPITAL_COMMUNITY)
Admission: EM | Admit: 2013-12-12 | Discharge: 2013-12-12 | Disposition: A | Discharge status: Home / Self Care | Payer: BC Managed Care – PPO | Source: Home / Self Care | Attending: Family Medicine | Admitting: Family Medicine

## 2013-12-12 DIAGNOSIS — T07XXXA Unspecified multiple injuries, initial encounter: Secondary | ICD-10-CM

## 2013-12-12 DIAGNOSIS — X58XXXA Exposure to other specified factors, initial encounter: Secondary | ICD-10-CM

## 2013-12-12 DIAGNOSIS — IMO0002 Reserved for concepts with insufficient information to code with codable children: Secondary | ICD-10-CM

## 2013-12-12 MED ORDER — LEVOFLOXACIN 750 MG PO TABS: 750.0000 mg | ORAL_TABLET | Freq: Every day | ORAL | Status: DC
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
## 2013-12-12 NOTE — Discharge Instructions (Signed)
Wash and care as discussed, return if needed.

## 2013-12-13 ENCOUNTER — Telehealth: Payer: Self-pay | Admitting: *Deleted

## 2013-12-13 ENCOUNTER — Ambulatory Visit (HOSPITAL_BASED_OUTPATIENT_CLINIC_OR_DEPARTMENT_OTHER): Payer: BC Managed Care – PPO | Admitting: Nurse Practitioner

## 2013-12-13 VITALS — BP 144/85 | HR 109 | Temp 98.1°F | Resp 18 | Ht 71.0 in | Wt 195.7 lb

## 2013-12-13 DIAGNOSIS — IMO0001 Reserved for inherently not codable concepts without codable children: Secondary | ICD-10-CM

## 2013-12-13 DIAGNOSIS — D469 Myelodysplastic syndrome, unspecified: Secondary | ICD-10-CM

## 2013-12-13 DIAGNOSIS — Q068 Other specified congenital malformations of spinal cord: Secondary | ICD-10-CM

## 2013-12-13 DIAGNOSIS — C9000 Multiple myeloma not having achieved remission: Secondary | ICD-10-CM

## 2013-12-13 DIAGNOSIS — L539 Erythematous condition, unspecified: Secondary | ICD-10-CM

## 2013-12-13 DIAGNOSIS — L989 Disorder of the skin and subcutaneous tissue, unspecified: Secondary | ICD-10-CM

## 2013-12-13 DIAGNOSIS — D696 Thrombocytopenia, unspecified: Secondary | ICD-10-CM

## 2013-12-13 DIAGNOSIS — R000 Tachycardia, unspecified: Secondary | ICD-10-CM

## 2013-12-13 MED ORDER — HYDROCODONE-ACETAMINOPHEN 5-325 MG PO TABS: 1.0000 | ORAL_TABLET | Freq: Four times a day (QID) | ORAL | Status: DC | PRN

## 2013-12-13 MED ORDER — DOXYCYCLINE HYCLATE 100 MG PO TABS: 100.0000 mg | ORAL_TABLET | Freq: Two times a day (BID) | ORAL | Status: AC
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
## 2013-12-13 NOTE — Telephone Encounter (Signed)
   Provider input needed: Possible wound infection   Reason for call: Abrasions on left knee and foot progressive reddness, swelling and pain over past 48 hours. Some drainage from knee wound.    ALLERGIES:  is allergic to primaxin; cephalosporins; clarithromycin; lisinopril; moxifloxacin; penicillins; and valsartan.  Patient last received chemotherapy/ treatment on n/a  Patient was last seen in the office on 12/07/13  Next appt is 12/24/13  Is patient having fevers greater than 100.5?  no   Is patient having uncontrolled pain, or new pain? yes, increase in pain left leg. Unable to ambulate without taking pain meds   Is patient having new back pain that changes with position (worsens or eases when laying down?)  no   Is patient able to eat and drink? yes    Is patient able to pass stool without difficulty?   yes     Is patient having uncontrolled nausea?  no    patient calls 12/13/2013 with complaint of  Integument/breast: positive for increase swelling,redness and pain of left leg abrasions over past 48 hours despite being on Levaquin X 7 days. Urgent care ordered another 5 days that is starting today.   Summary Based on the above information advised patient to  Come to office at 1:45 pm to see APP (per Dr. Benay Spice).   [name]  12/13/2013, 8:39 AM   Background Info  Chad [name]   DOB: 01-25-1956   MR#: [id number]   CSN#   [id number] 12/13/2013

## 2013-12-24 ENCOUNTER — Other Ambulatory Visit: Payer: BC Managed Care – PPO

## 2013-12-24 ENCOUNTER — Ambulatory Visit: Payer: BC Managed Care – PPO | Admitting: Nurse Practitioner

## 2013-12-24 ENCOUNTER — Other Ambulatory Visit: Payer: Self-pay | Admitting: Nurse Practitioner

## 2014-01-10 ENCOUNTER — Ambulatory Visit: Payer: BC Managed Care – PPO | Admitting: Internal Medicine

## 2014-01-12 ENCOUNTER — Telehealth: Payer: Self-pay | Admitting: *Deleted

## 2014-01-12 NOTE — Telephone Encounter (Signed)
Called pt for an update, he reports his leg is healing nicely. Transplant is scheduled for 01/28/14 has no needs at this time. He understands to call for appt PRN.

## 2014-02-11 ENCOUNTER — Telehealth: Payer: Self-pay | Admitting: *Deleted

## 2014-02-11 NOTE — Telephone Encounter (Signed)
Called Chad [name] to follow up on his status since being seen at [facility]. His rib pain was from pleural effusion. Hgb was down, but they did not have irradiated blood there. Was seen at [facility] on 7/28 and Hgb was up to 8.7 (his new normal) and was put on oral antibiotics. He is feeling better now. Returns on 02/18/14 to see Dr. 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 Working on the 3rd donor now (1st one fell through and 2nd not available till end of Sept). Transplant delayed again till end of August. Being seen weekly at [facility].

## 2014-03-07 ENCOUNTER — Ambulatory Visit (HOSPITAL_BASED_OUTPATIENT_CLINIC_OR_DEPARTMENT_OTHER): Payer: BC Managed Care – PPO | Admitting: Nurse Practitioner

## 2014-03-07 ENCOUNTER — Telehealth: Payer: Self-pay | Admitting: *Deleted

## 2014-03-07 VITALS — BP 162/76 | HR 111 | Temp 99.0°F | Resp 19 | Ht 71.0 in | Wt 197.2 lb

## 2014-03-07 DIAGNOSIS — M7660 Achilles tendinitis, unspecified leg: Secondary | ICD-10-CM

## 2014-03-07 DIAGNOSIS — L989 Disorder of the skin and subcutaneous tissue, unspecified: Secondary | ICD-10-CM

## 2014-03-07 DIAGNOSIS — G893 Neoplasm related pain (acute) (chronic): Secondary | ICD-10-CM

## 2014-03-07 DIAGNOSIS — R530 Neoplastic (malignant) related fatigue: Secondary | ICD-10-CM

## 2014-03-07 DIAGNOSIS — M6789 Other specified disorders of synovium and tendon, multiple sites: Secondary | ICD-10-CM

## 2014-03-07 DIAGNOSIS — C9000 Multiple myeloma not having achieved remission: Secondary | ICD-10-CM

## 2014-03-07 MED ORDER — OXYCODONE-ACETAMINOPHEN 5-325 MG PO TABS: 1.0000 | ORAL_TABLET | Freq: Four times a day (QID) | ORAL | Status: AC | PRN

## 2014-03-07 NOTE — Telephone Encounter (Signed)
Pt called reports, "My left buttock in hurting and I have not been able to sleep X3 nights; right achilles tendon is swollen along with my right ankle since Saturday; can someone see me today?"  Pt states he did call [facility] and they instructed him to call Dr. Benay Spice office to be seen since they have no availability today.  Pt denies fever/N/V; eating/drinking "OK"  Reports he will have his transplant on Thursday 03/10/14.  Per Dr. Benay Spice; instructed pt that Selena Lesser, NP with our [facility] can see pt today at 1pm.  Pt confirmed he will be here today at 1pm.

## 2014-03-08 ENCOUNTER — Encounter: Payer: Self-pay | Admitting: Nurse Practitioner

## 2014-03-08 DIAGNOSIS — L989 Disorder of the skin and subcutaneous tissue, unspecified: Secondary | ICD-10-CM | POA: Insufficient documentation

## 2014-03-08 DIAGNOSIS — R5383 Other fatigue: Secondary | ICD-10-CM | POA: Insufficient documentation

## 2014-03-08 NOTE — Assessment & Plan Note (Signed)
Patient does continue to complain of generalized pain; and specific increasing pain to the right Achilles area.  He is also complaining of some vague left buttock pain for approximate 2 weeks.  He states that his Vicodin is no longer effective in managing his pain.  Patient was given a prescription for Percocet to try.  Advised with patient and his family at to make sure that he does not take the hydrocodone when he is taking the Percocet.  Both patient and family stated understanding of all instructions.

## 2014-03-08 NOTE — Assessment & Plan Note (Signed)
Patient is scheduled to receive a transplant per Dr. Kathleen Lime from [facility] bone marrow transplant team this coming Thursday, 03/10/2014.  He has tapered his prednisone down to 10 mg daily.  Due to right Achilles area worrisome for infection-Dr. Benay Spice did communicate with Dr. Ok Edwards this afternoon.  The patient will followup with Dr. Ok Edwards at [facility] tomorrow morning at 10 AM for further evaluation of the site.  He may very well have to postpone his transplant plans.

## 2014-03-08 NOTE — Progress Notes (Signed)
[place]   Chief Complaint  Patient presents with  . Pain    HPI: Chad [name] 58 y.o. male diagnosed with multiple myeloma and MDS.  Patient is status post 2 previous transplant; and is scheduled for a third transplant at [facility] bone marrow transplant per Dr. Kathleen Lime this coming Thursday, 03/10/2014.  Patient called the cancer Center today requesting an urgent care visit.  He has been diagnosed in the past with neutrophilic dermatosis; and feels that he is having an eruption of these lesions at this time.  He feels it may very well be related to the fact that he has decreased his prednisone down to 10 mg on a daily basis in anticipation of his pending transplant.  He also states that he has developed some right Achilles erythema, warmth, edema, and tenderness.  He denies any known injury or trauma to his right Achilles area.  He is also been experiencing some left buttock pain for approximate 2 weeks.  He states that he has noticed several small lesions forming to various, generalized body areas within the last 1-2 weeks as well.  He denies any fever or chills recently.  He states that that his energy level and his appetite remains stable.  Patient reports that he is status post Levaquin and aquatic therapy approximately 10 days ago.  Patient states that his right hip/buttock area from his most recent bone marrow biopsy site approximately 1-1/2 weeks ago is healing fairly well; with no evidence of infection.   HPI  CURRENT THERAPY: Upcoming Treatment Dates - MYELOMA SALVAGE Bortezomib SQ q7d Days with orders from any treatment category:  02/10/2012      SCHEDULING COMMUNICATION      ondansetron (ZOFRAN) tablet 8 mg      bortezomib SQ (VELCADE) chemo injection 3 mg      TREATMENT CONDITIONS    ROS  Past Medical History  Diagnosis Date  . Unspecified essential hypertension   . Coronary atherosclerosis of unspecified type of vessel, native or graft   . Pure  hypercholesterolemia   . Overweight(278.02)   . Benign neoplasm of colon   . Multiple myeloma, without mention of having achieved remission 2004  . Urticaria, unspecified   . 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 Cancer 10/26/11 ct    multiple myelomatous lesion proximal right humerus,scaspula and ribs,  . History of chemotherapy 05/2003    high dose chemotherapy with autologus stem cell support/revlimid started 09/27/11  . Pneumonia 12/2007    hx  12/2007 & agian 01/06/09  . Allergy   . URI (upper respiratory infection) 08/2009    viral URI  . Neuropathy     hx intermittement tingling/numbness in feet r/t? revlimid, , resolved  . Neuropathy 10/22/11    intermittent tingling feet  again with revlimid therapy  . H/O cardiac catheterization 07/13/02  . H/O colonoscopy 04/2007    49m desc colon polyp =adenomatous  . [facility]  . Anemia   . Heart murmur     Past Surgical History  Procedure Laterality Date  . 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 Video bronchoscopy Bilateral 09/21/2012    Procedure: VIDEO BRONCHOSCOPY WITHOUT FLUORO;  Surgeon: DWilhelmina Mcardle MD;  Location: [facility];  Service: Cardiopulmonary;  Laterality: Bilateral;    has MULTIPLE  MYELOMA; COLONIC POLYPS; HYPERCHOLESTEROLEMIA; OVERWEIGHT; HYPERTENSION; CORONARY ARTERY  DISEASE; URTICARIA; ANGIOEDEMA; Influenza A; Thrombocytopenia; HCAP (healthcare-associated pneumonia); Fever; Myelodysplasia; Skin lesions; Neoplasm related pain (acute) (chronic); Fatigue; and Dermatosis on his problem list.     is allergic to primaxin; cephalosporins; clarithromycin; lisinopril; moxifloxacin; penicillins; and valsartan.    Medication List       This list is accurate as of: 03/07/14 11:59 PM.  Always use your most recent med list.               acetaminophen 500 MG tablet  Commonly known as:  TYLENOL  Per bottle as needed for pain      ALPRAZolam 0.25 MG tablet  Commonly known as:  XANAX  Take 1-2 tablets (0.25-0.5 mg total) by mouth 2 (two) times daily as needed for anxiety.     Calcium 600-200 MG-UNIT per tablet  Take 1 tablet by mouth 2 (two) times daily.     doxycycline 100 MG tablet  Commonly known as:  VIBRA-TABS  Take 1 tablet (100 mg total) by mouth 2 (two) times daily.     oxyCODONE-acetaminophen 5-325 MG per tablet  Commonly known as:  PERCOCET/ROXICET  Take 1-2 tablets by mouth every 6 (six) hours as needed for severe pain.     ZOMETA IV  Inject 4 mg into the vein every 3 (three) months.         PHYSICAL EXAMINATION  Blood pressure 162/76, pulse 111, temperature 99 F (37.2 C), temperature source Oral, resp. rate 19, height 5' 11"  (1.803 m), weight 197 lb 3.2 oz (89.449 kg).  Physical Exam  Nursing note and vitals reviewed. Constitutional: He is oriented to person, place, and time and well-developed, well-nourished, and in no distress.  HENT:  Head: Normocephalic and atraumatic.  Eyes: Conjunctivae are normal. Pupils are equal, round, and reactive to light.  Neck: Normal range of motion. Neck supple.  Cardiovascular: Regular rhythm, S1 normal, S2 normal and intact distal pulses.  Tachycardia present.  Exam reveals no friction rub.   No murmur heard. Pulmonary/Chest: Effort normal and breath sounds normal. No respiratory distress.  Abdominal: Soft. Bowel sounds are normal. He exhibits no distension and no mass. There is no tenderness. There is no rebound.  Musculoskeletal: Normal range of motion. He exhibits edema and tenderness.       Right shoulder: He exhibits tenderness and swelling.  Right posterior Achilles area with erythema, warmth, edema, and mild tenderness with palpation.  Patient is able to bear weight to his right foot and ambulate; but does state it is somewhat tender.  All pulses are palpable in all extremities are warm.  Lymphadenopathy:    He has no cervical adenopathy.    Neurological: He is alert and oriented to person, place, and time. Gait normal.  Skin: Skin is warm and dry. No rash noted. There is erythema.  Patient does have an multiple lesions in.  His speech is of abruption at to his body.  Patient has a very large, full vein left calf healing a dermatosis lesion.  He has a new lesion forming to his right elbow region.  He has several small lesions around his right waist directly underneath his belt.  He also has a few tiny red lesions to the right temple area and to the right side of his nose where his glasses light.  Right hip/buttock area from most recent bone marrow biopsy site  does appear scabbed; and healing fairly well with no evidence of infection.  Psychiatric: Affect normal.    LABORATORY DATA:. CBC  Lab Results  Component Value Date   WBC 15.6* 12/07/2013   RBC 3.19* 12/07/2013   HGB 9.6* 12/07/2013   HCT 28.0* 12/07/2013   PLT 66* 12/07/2013   MCV 87.8 12/07/2013   MCH 30.1 12/07/2013   MCHC 34.3 12/07/2013   RDW 22.3* 12/07/2013   LYMPHSABS 0.7* 12/07/2013   MONOABS 1.7* 12/07/2013   EOSABS 0.0 12/07/2013   BASOSABS 0.0 12/07/2013     CMET  Lab Results  Component Value Date   NA 140 12/01/2013   K 4.0 12/01/2013   CL 105 11/11/2013   CO2 20* 12/01/2013   GLUCOSE 111 12/01/2013   BUN 14.9 12/01/2013   CREATININE 0.9 12/01/2013   CALCIUM 8.9 12/01/2013   PROT 8.4* 11/11/2013   ALBUMIN 3.9 11/11/2013   AST 20 11/11/2013   ALT 24 11/11/2013   ALKPHOS 72 11/11/2013   BILITOT 0.8 11/11/2013   GFRNONAA >90 11/06/2013   GFRAA >90 11/06/2013    ASSESSMENT/PLAN:    MULTIPLE  MYELOMA  Assessment & Plan Patient is scheduled to receive a transplant per Dr. Kathleen Lime from [facility] bone marrow transplant team this coming Thursday, 03/10/2014.  He has tapered his prednisone down to 10 mg daily.  Due to right Achilles area worrisome for infection-Dr. Benay Spice did communicate with Dr. Ok Edwards this afternoon.  The patient will followup with Dr. Ok Edwards at [facility]  tomorrow morning at 10 AM for further evaluation of the site.  He may very well have to postpone his transplant plans.     Neoplasm related pain (acute) (chronic)  Assessment & Plan Patient does continue to complain of generalized pain; and specific increasing pain to the right Achilles area.  He is also complaining of some vague left buttock pain for approximate 2 weeks.  He states that his Vicodin is no longer effective in managing his pain.  Patient was given a prescription for Percocet to try.  Advised with patient and his family at to make sure that he does not take the hydrocodone when he is taking the Percocet.  Both patient and family stated understanding of all instructions.     Fatigue  Assessment & Plan Fatigue most likely secondary to multiple myeloma/MDS.  Patient was encouraged to remain as active as possible.   Dermatosis  Assessment & Plan Patient has had a full dermatological evaluation in the past; and has been diagnosed with neutrophilic dermatosis.  Patient has tapered himself down to prednisone 10 mg on a daily basis in anticipation of his pending transplant.  He does continue with multiple dermatosis-type lesions.  It is quite possible that patient's right Achilles area is also related to patient's dermatosis as well.  No lesions appear to be actively infected at this time.   Patient stated understanding of all instructions; and was in agreement with this plan of care. The patient knows to call the clinic with any problems, questions or concerns.   This was a shared visit with Dr. Benay Spice today.  Total time spent with patient was 40 minutes;  with greater than 80 percent of that time spent in face to face counseling regarding his symptoms, communication with Dr. Ok Edwards with [facility] bone marrow transplant team, and coordination of care and follow up.  Disclaimer: This note was dictated with voice recognition software. Similar sounding words can inadvertently be transcribed  and may not be corrected upon review.   Drue Second, NP 03/08/2014   This was a shared visit with Drue Second.  Chad [name] was interviewed  and examined.  He presents with erythema/induration at the right Achilles region. There are also indurated erythematous lesions at the waistline and right arm. I suspect these represent the same process he has been treated for over the past 3 months.  I contacted Dr. Ok Edwards. Chad [name] will be evaluated at [facility] 03/08/2014. He is scheduled to begin the bone marrow transplant conditioning regimen later this week.  Dr. Ok Edwards will evaluate the skin lesions and obtained imaging of the pelvis to evaluate the left pelvic/ischium pain.  We are available to see Chad [name] as needed.  Julieanne Manson, M.D.

## 2014-03-08 NOTE — Assessment & Plan Note (Signed)
Fatigue most likely secondary to multiple myeloma/MDS.  Patient was encouraged to remain as active as possible.

## 2014-03-08 NOTE — Assessment & Plan Note (Addendum)
Patient has had a full dermatological evaluation in the past; and has been diagnosed with neutrophilic dermatosis.  Patient has tapered himself down to prednisone 10 mg on a daily basis in anticipation of his pending transplant.  He does continue with multiple dermatosis-type lesions.  It is quite possible that patient's right Achilles area is also related to patient's dermatosis as well.  No lesions appear to be actively infected at this time.

## 2014-03-09 ENCOUNTER — Telehealth: Payer: Self-pay | Admitting: *Deleted

## 2014-03-09 NOTE — Telephone Encounter (Signed)
Received message for pt reports:  "wanted to give Dr. Benay Spice update from [facility] yesterday; CT Hip showed soft tissue mass in the left buttock area and they are going to schedule PET this week; also did right ankle biopsy/ waiting on results."  Pt reports they increased prednisone to 30 mg and "the swelling has gone down and there is less pain in my ankle"  Note to Dr. Benay Spice.

## 2014-03-10 ENCOUNTER — Telehealth: Payer: Self-pay | Admitting: *Deleted

## 2014-03-10 NOTE — Telephone Encounter (Signed)
Dr. Benay Spice received call from Dr. Ok Edwards at [facility] that pain in left buttock/hip is not being controlled with current meds. Dr. Ok Edwards increased his Prednisone to 60 mg and requested this office f/u with patient. Called and left VM for Chad [name] to let us know if the Percocet #2 every 6 hours is not helping his pain enough. Dr. Benay Spice agrees to write script for Dilaudid 4 mg 1-2 tabs every 4 hours as needed. Awaiting his call.

## 2014-03-10 NOTE — Telephone Encounter (Signed)
Chad [name] left VM reporting he was en route to [facility] now to get script for pain medication. Is going to try what they provide and if it does not help, he will call us again. Appreciates our checking on him.

## 2014-03-11 ENCOUNTER — Other Ambulatory Visit: Payer: Self-pay | Admitting: *Deleted

## 2014-03-11 ENCOUNTER — Telehealth: Payer: Self-pay | Admitting: *Deleted

## 2014-03-11 MED ORDER — HYDROMORPHONE HCL 4 MG PO TABS: ORAL_TABLET | ORAL | Status: AC

## 2014-03-11 NOTE — Telephone Encounter (Signed)
Received message from pt re: pain management.  Spoke with pt and he reports "I took 3 oxycodone 5 mg @ 11pm with a Xanax to try and sleep and d/t pain and I still had trouble sleeping because of the pain."  States he took 2 more oxycodone @ 3am and finally got relief and slept.  Pt contacted Dr. Octaviano Batty office and they recommended pt call Dr. Gearldine Shown office for stronger pain med.  Per Dr. Benay Spice; notified pt that Dilaudid pain script will be ready for pick-up this afternoon.  Pt verbalized understanding of information and understands to call if any further problems.

## 2014-03-28 ENCOUNTER — Encounter: Payer: Self-pay | Admitting: *Deleted

## 2014-03-28 NOTE — Progress Notes (Signed)
[place] Social Work  Clinical Social Work met with patient during Multiple Myeloma support group to review and complete healthcare advance directives. Patient completed healthcare power of attorney and healthcare living will.    Clinical Social Worker notarized documents and made copies for patient/family. Clinical Social Worker will send documents to medical records to be scanned into patient's chart. Clinical Social Worker encouraged patient/family to contact with any additional questions or concerns.  Polo Riley, MSW, [place] Worker [facility] [phone]

## 2014-05-25 ENCOUNTER — Other Ambulatory Visit: Payer: Self-pay | Admitting: Oncology

## 2014-05-27 ENCOUNTER — Telehealth: Payer: Self-pay | Admitting: *Deleted

## 2014-05-27 NOTE — Telephone Encounter (Signed)
Message from pt reporting he was discharged from hospital on 11/10, staying in [place] for now. They initially thought his transplant didn't take but it has. Feels well.

## 2014-09-15 ENCOUNTER — Other Ambulatory Visit: Payer: Self-pay | Admitting: Pulmonary Disease

## 2015-05-16 DEATH — deceased

## 2015-05-29 ENCOUNTER — Telehealth: Payer: Self-pay | Admitting: *Deleted

## 2015-05-29 NOTE — Telephone Encounter (Signed)
Dr. Benay Spice called pt's wife to offer condolences, she requested referral for grief support. Left message for Tammy with [facility] counseling to contact pt. Also requested our social worker reach out to Mrs. Mcmannis.

## 2015-06-01 ENCOUNTER — Encounter: Payer: Self-pay | Admitting: *Deleted

## 2015-06-01 NOTE — Progress Notes (Signed)
[place] Work  Clinical Social Work was referred by nursing for assessment of psychosocial needs.  Clinical Social Worker phoned wife of patient at home to offer support and assess for needs due to grief concerns. Wife reports to have a good support from friends and family. She reports to have an appointment with Chad Sabin, LCSW at [facility] for counseling in about two weeks. CSW reviewed resources at [facility] and wife plans to follow up. She agrees to reach out as needed and appreciated supportive call.   Clinical Social Work interventions: Grief Support  Chad [name], [place] Worker [place]  [place] Phone: [phone] Fax: [phone]

## 2015-11-07 ENCOUNTER — Encounter: Payer: Self-pay | Admitting: Cardiology

## 2015-11-07 NOTE — Telephone Encounter (Deleted)
error 

## 2015-11-07 NOTE — Telephone Encounter (Signed)
This encounter was created in error - please disregard.

## 2015-11-07 NOTE — Telephone Encounter (Deleted)
ERROR

## 2015-11-13 ENCOUNTER — Ambulatory Visit: Payer: Self-pay | Admitting: Cardiology

## 2015-12-23 ENCOUNTER — Other Ambulatory Visit: Payer: Self-pay | Admitting: Nurse Practitioner

## 2015-12-29 ENCOUNTER — Other Ambulatory Visit: Payer: Self-pay | Admitting: Nurse Practitioner

## 2024-06-23 ENCOUNTER — Other Ambulatory Visit: Payer: Self-pay
